# Patient Record
Sex: Male | Born: 1935 | Race: White | Hispanic: No | Marital: Single | Smoking: Former smoker
Health system: Southern US, Community
[De-identification: ages and names within clinical notes are randomized; demographics above are authoritative.]

## PROBLEM LIST (undated history)

## (undated) DIAGNOSIS — E785 Hyperlipidemia, unspecified: Secondary | ICD-10-CM

## (undated) DIAGNOSIS — G609 Hereditary and idiopathic neuropathy, unspecified: Secondary | ICD-10-CM

## (undated) DIAGNOSIS — M79609 Pain in unspecified limb: Secondary | ICD-10-CM

## (undated) DIAGNOSIS — N400 Enlarged prostate without lower urinary tract symptoms: Secondary | ICD-10-CM

## (undated) DIAGNOSIS — D649 Anemia, unspecified: Secondary | ICD-10-CM

## (undated) DIAGNOSIS — M1990 Unspecified osteoarthritis, unspecified site: Secondary | ICD-10-CM

## (undated) DIAGNOSIS — I714 Abdominal aortic aneurysm, without rupture: Secondary | ICD-10-CM

## (undated) DIAGNOSIS — K5730 Diverticulosis of large intestine without perforation or abscess without bleeding: Secondary | ICD-10-CM

## (undated) DIAGNOSIS — R42 Dizziness and giddiness: Secondary | ICD-10-CM

## (undated) DIAGNOSIS — Y92009 Unspecified place in unspecified non-institutional (private) residence as the place of occurrence of the external cause: Secondary | ICD-10-CM

## (undated) DIAGNOSIS — N289 Disorder of kidney and ureter, unspecified: Secondary | ICD-10-CM

## (undated) DIAGNOSIS — N183 Chronic kidney disease, stage 3 unspecified: Secondary | ICD-10-CM

## (undated) DIAGNOSIS — F329 Major depressive disorder, single episode, unspecified: Secondary | ICD-10-CM

## (undated) DIAGNOSIS — C189 Malignant neoplasm of colon, unspecified: Secondary | ICD-10-CM

## (undated) DIAGNOSIS — R609 Edema, unspecified: Secondary | ICD-10-CM

## (undated) DIAGNOSIS — W19XXXA Unspecified fall, initial encounter: Secondary | ICD-10-CM

## (undated) DIAGNOSIS — F419 Anxiety disorder, unspecified: Secondary | ICD-10-CM

## (undated) DIAGNOSIS — F32A Depression, unspecified: Secondary | ICD-10-CM

## (undated) DIAGNOSIS — E875 Hyperkalemia: Secondary | ICD-10-CM

## (undated) DIAGNOSIS — R079 Chest pain, unspecified: Secondary | ICD-10-CM

## (undated) DIAGNOSIS — I440 Atrioventricular block, first degree: Secondary | ICD-10-CM

## (undated) DIAGNOSIS — N189 Chronic kidney disease, unspecified: Secondary | ICD-10-CM

## (undated) DIAGNOSIS — R0602 Shortness of breath: Secondary | ICD-10-CM

## (undated) DIAGNOSIS — M25559 Pain in unspecified hip: Secondary | ICD-10-CM

## (undated) DIAGNOSIS — I498 Other specified cardiac arrhythmias: Secondary | ICD-10-CM

## (undated) DIAGNOSIS — R269 Unspecified abnormalities of gait and mobility: Secondary | ICD-10-CM

## (undated) DIAGNOSIS — M109 Gout, unspecified: Secondary | ICD-10-CM

## (undated) DIAGNOSIS — I219 Acute myocardial infarction, unspecified: Secondary | ICD-10-CM

## (undated) DIAGNOSIS — I10 Essential (primary) hypertension: Secondary | ICD-10-CM

## (undated) DIAGNOSIS — J189 Pneumonia, unspecified organism: Secondary | ICD-10-CM

## (undated) DIAGNOSIS — I2510 Atherosclerotic heart disease of native coronary artery without angina pectoris: Secondary | ICD-10-CM

## (undated) DIAGNOSIS — H6120 Impacted cerumen, unspecified ear: Secondary | ICD-10-CM

## (undated) HISTORY — DX: Hyperkalemia: E87.5

## (undated) HISTORY — DX: Chronic kidney disease, unspecified: N18.9

## (undated) HISTORY — DX: Pain in unspecified limb: M79.609

## (undated) HISTORY — DX: Acute myocardial infarction, unspecified: I21.9

## (undated) HISTORY — DX: Hyperlipidemia, unspecified: E78.5

## (undated) HISTORY — DX: Hereditary and idiopathic neuropathy, unspecified: G60.9

## (undated) HISTORY — DX: Diverticulosis of large intestine without perforation or abscess without bleeding: K57.30

## (undated) HISTORY — DX: Unspecified fall, initial encounter: W19.XXXA

## (undated) HISTORY — DX: Atrioventricular block, first degree: I44.0

## (undated) HISTORY — DX: Gout, unspecified: M10.9

## (undated) HISTORY — PX: EYE SURGERY: SHX253

## (undated) HISTORY — DX: Anemia, unspecified: D64.9

## (undated) HISTORY — DX: Unspecified abnormalities of gait and mobility: R26.9

## (undated) HISTORY — DX: Chest pain, unspecified: R07.9

## (undated) HISTORY — DX: Edema, unspecified: R60.9

## (undated) HISTORY — DX: Impacted cerumen, unspecified ear: H61.20

## (undated) HISTORY — DX: Disorder of kidney and ureter, unspecified: N28.9

## (undated) HISTORY — PX: CARDIAC CATHETERIZATION: SHX172

## (undated) HISTORY — DX: Pain in unspecified hip: M25.559

## (undated) HISTORY — DX: Abdominal aortic aneurysm, without rupture: I71.4

## (undated) HISTORY — DX: Depression, unspecified: F32.A

## (undated) HISTORY — DX: Other specified cardiac arrhythmias: I49.8

## (undated) HISTORY — DX: Malignant neoplasm of colon, unspecified: C18.9

## (undated) HISTORY — PX: MEDIASTINOTOMY: SHX1011

## (undated) HISTORY — DX: Unspecified osteoarthritis, unspecified site: M19.90

## (undated) HISTORY — DX: Essential (primary) hypertension: I10

## (undated) HISTORY — DX: Benign prostatic hyperplasia without lower urinary tract symptoms: N40.0

## (undated) HISTORY — DX: Unspecified place in unspecified non-institutional (private) residence as the place of occurrence of the external cause: Y92.009

## (undated) HISTORY — DX: Atherosclerotic heart disease of native coronary artery without angina pectoris: I25.10

## (undated) HISTORY — DX: Major depressive disorder, single episode, unspecified: F32.9

## (undated) HISTORY — PX: COLON SURGERY: SHX602

---
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
## 1982-06-12 DIAGNOSIS — I219 Acute myocardial infarction, unspecified: Secondary | ICD-10-CM

## 1982-06-12 HISTORY — DX: Acute myocardial infarction, unspecified: I21.9

## 1994-06-12 HISTORY — PX: CORONARY ARTERY BYPASS GRAFT: SHX141

## 1998-04-30 ENCOUNTER — Encounter: Payer: Self-pay | Admitting: *Deleted

## 1998-05-05 ENCOUNTER — Ambulatory Visit: Admission: RE | Admit: 1998-05-05 | Discharge: 1998-05-05 | Payer: Self-pay | Admitting: *Deleted

## 2001-06-12 DIAGNOSIS — I2510 Atherosclerotic heart disease of native coronary artery without angina pectoris: Secondary | ICD-10-CM

## 2001-06-12 DIAGNOSIS — R079 Chest pain, unspecified: Secondary | ICD-10-CM

## 2001-06-12 DIAGNOSIS — M79609 Pain in unspecified limb: Secondary | ICD-10-CM

## 2001-06-12 HISTORY — DX: Chest pain, unspecified: R07.9

## 2001-06-12 HISTORY — DX: Pain in unspecified limb: M79.609

## 2001-06-12 HISTORY — DX: Atherosclerotic heart disease of native coronary artery without angina pectoris: I25.10

## 2002-06-12 DIAGNOSIS — G609 Hereditary and idiopathic neuropathy, unspecified: Secondary | ICD-10-CM

## 2002-06-12 DIAGNOSIS — C189 Malignant neoplasm of colon, unspecified: Secondary | ICD-10-CM

## 2002-06-12 DIAGNOSIS — M25559 Pain in unspecified hip: Secondary | ICD-10-CM

## 2002-06-12 HISTORY — DX: Hereditary and idiopathic neuropathy, unspecified: G60.9

## 2002-06-12 HISTORY — PX: RIGHT COLECTOMY: SHX853

## 2002-06-12 HISTORY — DX: Pain in unspecified hip: M25.559

## 2002-06-12 HISTORY — DX: Malignant neoplasm of colon, unspecified: C18.9

## 2003-06-03 ENCOUNTER — Encounter: Admission: RE | Admit: 2003-06-03 | Discharge: 2003-06-03 | Payer: Self-pay | Admitting: General Surgery

## 2003-06-04 ENCOUNTER — Encounter (INDEPENDENT_AMBULATORY_CARE_PROVIDER_SITE_OTHER): Payer: Self-pay | Admitting: *Deleted

## 2003-06-04 ENCOUNTER — Inpatient Hospital Stay (HOSPITAL_COMMUNITY): Admission: RE | Admit: 2003-06-04 | Discharge: 2003-06-10 | Payer: Self-pay | Admitting: General Surgery

## 2003-06-13 DIAGNOSIS — R609 Edema, unspecified: Secondary | ICD-10-CM

## 2003-06-13 DIAGNOSIS — N289 Disorder of kidney and ureter, unspecified: Secondary | ICD-10-CM

## 2003-06-13 DIAGNOSIS — I498 Other specified cardiac arrhythmias: Secondary | ICD-10-CM

## 2003-06-13 HISTORY — DX: Other specified cardiac arrhythmias: I49.8

## 2003-06-13 HISTORY — DX: Disorder of kidney and ureter, unspecified: N28.9

## 2003-06-13 HISTORY — DX: Edema, unspecified: R60.9

## 2003-07-08 ENCOUNTER — Ambulatory Visit (HOSPITAL_BASED_OUTPATIENT_CLINIC_OR_DEPARTMENT_OTHER): Admission: RE | Admit: 2003-07-08 | Discharge: 2003-07-08 | Payer: Self-pay | Admitting: General Surgery

## 2003-07-08 ENCOUNTER — Ambulatory Visit (HOSPITAL_COMMUNITY): Admission: RE | Admit: 2003-07-08 | Discharge: 2003-07-08 | Payer: Self-pay | Admitting: General Surgery

## 2004-03-29 ENCOUNTER — Ambulatory Visit (HOSPITAL_COMMUNITY): Admission: RE | Admit: 2004-03-29 | Discharge: 2004-03-29 | Payer: Self-pay | Admitting: Hematology & Oncology

## 2004-04-08 ENCOUNTER — Ambulatory Visit (HOSPITAL_COMMUNITY): Admission: RE | Admit: 2004-04-08 | Discharge: 2004-04-08 | Payer: Self-pay | Admitting: Diagnostic Radiology

## 2004-04-20 ENCOUNTER — Ambulatory Visit: Payer: Self-pay | Admitting: Hematology & Oncology

## 2004-07-01 ENCOUNTER — Ambulatory Visit: Payer: Self-pay | Admitting: Internal Medicine

## 2004-07-05 ENCOUNTER — Ambulatory Visit: Payer: Self-pay | Admitting: Hematology & Oncology

## 2004-07-07 ENCOUNTER — Ambulatory Visit (HOSPITAL_COMMUNITY): Admission: RE | Admit: 2004-07-07 | Discharge: 2004-07-07 | Payer: Self-pay | Admitting: Hematology & Oncology

## 2004-07-20 ENCOUNTER — Ambulatory Visit: Payer: Self-pay | Admitting: Internal Medicine

## 2004-08-23 ENCOUNTER — Ambulatory Visit: Payer: Self-pay | Admitting: Hematology & Oncology

## 2004-11-03 ENCOUNTER — Ambulatory Visit: Payer: Self-pay | Admitting: Hematology & Oncology

## 2004-11-09 ENCOUNTER — Ambulatory Visit (HOSPITAL_COMMUNITY): Admission: RE | Admit: 2004-11-09 | Discharge: 2004-11-09 | Payer: Self-pay | Admitting: Hematology & Oncology

## 2004-12-05 ENCOUNTER — Ambulatory Visit: Payer: Self-pay | Admitting: Internal Medicine

## 2004-12-09 ENCOUNTER — Ambulatory Visit: Payer: Self-pay

## 2004-12-27 ENCOUNTER — Ambulatory Visit: Payer: Self-pay | Admitting: Hematology & Oncology

## 2005-02-28 ENCOUNTER — Ambulatory Visit: Payer: Self-pay | Admitting: Hematology & Oncology

## 2005-04-18 ENCOUNTER — Ambulatory Visit (HOSPITAL_COMMUNITY): Admission: RE | Admit: 2005-04-18 | Discharge: 2005-04-18 | Payer: Self-pay | Admitting: Hematology & Oncology

## 2005-06-07 ENCOUNTER — Ambulatory Visit: Payer: Self-pay | Admitting: Hematology & Oncology

## 2005-06-12 DIAGNOSIS — R269 Unspecified abnormalities of gait and mobility: Secondary | ICD-10-CM

## 2005-06-12 HISTORY — DX: Unspecified abnormalities of gait and mobility: R26.9

## 2005-06-16 ENCOUNTER — Ambulatory Visit: Payer: Self-pay | Admitting: Internal Medicine

## 2005-06-19 ENCOUNTER — Ambulatory Visit (HOSPITAL_BASED_OUTPATIENT_CLINIC_OR_DEPARTMENT_OTHER): Admission: RE | Admit: 2005-06-19 | Discharge: 2005-06-19 | Payer: Self-pay | Admitting: General Surgery

## 2005-06-26 ENCOUNTER — Ambulatory Visit: Payer: Self-pay | Admitting: Internal Medicine

## 2005-07-21 ENCOUNTER — Ambulatory Visit: Payer: Self-pay | Admitting: Internal Medicine

## 2005-10-11 ENCOUNTER — Ambulatory Visit: Payer: Self-pay | Admitting: Internal Medicine

## 2005-10-18 ENCOUNTER — Ambulatory Visit: Payer: Self-pay | Admitting: Cardiology

## 2005-11-24 ENCOUNTER — Ambulatory Visit: Payer: Self-pay | Admitting: Hematology & Oncology

## 2005-11-28 LAB — CBC WITH DIFFERENTIAL/PLATELET
BASO%: 0.3 % (ref 0.0–2.0)
EOS%: 2.3 % (ref 0.0–7.0)
Eosinophils Absolute: 0.1 10*3/uL (ref 0.0–0.5)
LYMPH%: 30.3 % (ref 14.0–48.0)
MCH: 31.1 pg (ref 28.0–33.4)
MCHC: 34 g/dL (ref 32.0–35.9)
MCV: 91.4 fL (ref 81.6–98.0)
MONO%: 6.7 % (ref 0.0–13.0)
Platelets: 164 10*3/uL (ref 145–400)
RBC: 4.31 10*6/uL (ref 4.20–5.71)

## 2005-11-28 LAB — COMPREHENSIVE METABOLIC PANEL
AST: 22 U/L (ref 0–37)
Alkaline Phosphatase: 57 U/L (ref 39–117)
Glucose, Bld: 97 mg/dL (ref 70–99)
Sodium: 141 mEq/L (ref 135–145)
Total Bilirubin: 0.7 mg/dL (ref 0.3–1.2)
Total Protein: 6.8 g/dL (ref 6.0–8.3)

## 2005-12-01 ENCOUNTER — Ambulatory Visit (HOSPITAL_COMMUNITY): Admission: RE | Admit: 2005-12-01 | Discharge: 2005-12-01 | Payer: Self-pay | Admitting: Hematology & Oncology

## 2006-01-02 ENCOUNTER — Ambulatory Visit: Payer: Self-pay | Admitting: Internal Medicine

## 2006-06-03 ENCOUNTER — Ambulatory Visit: Payer: Self-pay | Admitting: Hematology & Oncology

## 2006-06-08 LAB — CBC WITH DIFFERENTIAL/PLATELET
BASO%: 0.5 % (ref 0.0–2.0)
Basophils Absolute: 0 10*3/uL (ref 0.0–0.1)
EOS%: 2.7 % (ref 0.0–7.0)
Eosinophils Absolute: 0.2 10*3/uL (ref 0.0–0.5)
HCT: 43.2 % (ref 38.7–49.9)
HGB: 14.7 g/dL (ref 13.0–17.1)
LYMPH%: 24.2 % (ref 14.0–48.0)
MCH: 31.4 pg (ref 28.0–33.4)
MCHC: 34 g/dL (ref 32.0–35.9)
MCV: 92.2 fL (ref 81.6–98.0)
MONO#: 0.4 10*3/uL (ref 0.1–0.9)
MONO%: 5.5 % (ref 0.0–13.0)
NEUT#: 4.5 10*3/uL (ref 1.5–6.5)
NEUT%: 67.1 % (ref 40.0–75.0)
Platelets: 181 10*3/uL (ref 145–400)
RBC: 4.69 10*6/uL (ref 4.20–5.71)
RDW: 13.6 % (ref 11.2–14.6)
WBC: 6.8 10*3/uL (ref 4.0–10.0)
lymph#: 1.6 10*3/uL (ref 0.9–3.3)

## 2006-06-08 LAB — COMPREHENSIVE METABOLIC PANEL
ALT: 17 U/L (ref 0–53)
AST: 20 U/L (ref 0–37)
Albumin: 4.5 g/dL (ref 3.5–5.2)
Alkaline Phosphatase: 61 U/L (ref 39–117)
Calcium: 9.4 mg/dL (ref 8.4–10.5)
Chloride: 102 mEq/L (ref 96–112)
Potassium: 4.6 mEq/L (ref 3.5–5.3)
Sodium: 143 mEq/L (ref 135–145)
Total Protein: 7 g/dL (ref 6.0–8.3)

## 2006-06-12 DIAGNOSIS — M109 Gout, unspecified: Secondary | ICD-10-CM

## 2006-06-12 DIAGNOSIS — N400 Enlarged prostate without lower urinary tract symptoms: Secondary | ICD-10-CM

## 2006-06-12 DIAGNOSIS — I440 Atrioventricular block, first degree: Secondary | ICD-10-CM

## 2006-06-12 DIAGNOSIS — E875 Hyperkalemia: Secondary | ICD-10-CM

## 2006-06-12 HISTORY — DX: Hyperkalemia: E87.5

## 2006-06-12 HISTORY — DX: Atrioventricular block, first degree: I44.0

## 2006-06-12 HISTORY — DX: Benign prostatic hyperplasia without lower urinary tract symptoms: N40.0

## 2006-06-12 HISTORY — DX: Gout, unspecified: M10.9

## 2006-06-20 DIAGNOSIS — H6120 Impacted cerumen, unspecified ear: Secondary | ICD-10-CM

## 2006-06-20 HISTORY — DX: Impacted cerumen, unspecified ear: H61.20

## 2006-10-18 ENCOUNTER — Ambulatory Visit: Payer: Self-pay | Admitting: *Deleted

## 2006-12-05 ENCOUNTER — Ambulatory Visit: Payer: Self-pay | Admitting: Hematology & Oncology

## 2007-01-04 LAB — CBC WITH DIFFERENTIAL/PLATELET
BASO%: 0.3 % (ref 0.0–2.0)
EOS%: 1.9 % (ref 0.0–7.0)
HCT: 40.6 % (ref 38.7–49.9)
LYMPH%: 17.6 % (ref 14.0–48.0)
MCH: 32 pg (ref 28.0–33.4)
MCHC: 35.4 g/dL (ref 32.0–35.9)
MCV: 90.5 fL (ref 81.6–98.0)
MONO#: 0.4 10*3/uL (ref 0.1–0.9)
MONO%: 5.6 % (ref 0.0–13.0)
NEUT%: 74.6 % (ref 40.0–75.0)
Platelets: 178 10*3/uL (ref 145–400)

## 2007-01-04 LAB — COMPREHENSIVE METABOLIC PANEL
ALT: 22 U/L (ref 0–53)
Alkaline Phosphatase: 64 U/L (ref 39–117)
CO2: 23 mEq/L (ref 19–32)
Creatinine, Ser: 2.03 mg/dL — ABNORMAL HIGH (ref 0.40–1.50)
Total Bilirubin: 0.4 mg/dL (ref 0.3–1.2)

## 2007-01-04 LAB — CEA: CEA: 1.4 ng/mL (ref 0.0–5.0)

## 2007-01-08 ENCOUNTER — Ambulatory Visit: Payer: Self-pay | Admitting: Gastroenterology

## 2007-01-08 LAB — BASIC METABOLIC PANEL
CO2: 22 mEq/L (ref 19–32)
Chloride: 108 mEq/L (ref 96–112)
Creatinine, Ser: 2.17 mg/dL — ABNORMAL HIGH (ref 0.40–1.50)
Glucose, Bld: 192 mg/dL — ABNORMAL HIGH (ref 70–99)

## 2007-04-18 ENCOUNTER — Ambulatory Visit: Payer: Self-pay | Admitting: *Deleted

## 2007-06-26 ENCOUNTER — Ambulatory Visit: Payer: Self-pay | Admitting: Hematology & Oncology

## 2007-06-28 LAB — CBC WITH DIFFERENTIAL/PLATELET
Basophils Absolute: 0 10*3/uL (ref 0.0–0.1)
EOS%: 3 % (ref 0.0–7.0)
HCT: 42 % (ref 38.7–49.9)
HGB: 14.4 g/dL (ref 13.0–17.1)
MCH: 31.2 pg (ref 28.0–33.4)
MCV: 90.8 fL (ref 81.6–98.0)
MONO%: 5.1 % (ref 0.0–13.0)
NEUT%: 68.5 % (ref 40.0–75.0)
RDW: 13.8 % (ref 11.2–14.6)

## 2007-06-28 LAB — COMPREHENSIVE METABOLIC PANEL
AST: 16 U/L (ref 0–37)
Alkaline Phosphatase: 71 U/L (ref 39–117)
BUN: 28 mg/dL — ABNORMAL HIGH (ref 6–23)
Creatinine, Ser: 2.08 mg/dL — ABNORMAL HIGH (ref 0.40–1.50)
Total Bilirubin: 0.5 mg/dL (ref 0.3–1.2)

## 2007-09-26 DIAGNOSIS — Z85038 Personal history of other malignant neoplasm of large intestine: Secondary | ICD-10-CM | POA: Insufficient documentation

## 2007-09-26 DIAGNOSIS — R269 Unspecified abnormalities of gait and mobility: Secondary | ICD-10-CM

## 2007-09-26 DIAGNOSIS — D649 Anemia, unspecified: Secondary | ICD-10-CM | POA: Insufficient documentation

## 2007-09-26 DIAGNOSIS — I10 Essential (primary) hypertension: Secondary | ICD-10-CM | POA: Insufficient documentation

## 2007-09-26 DIAGNOSIS — G609 Hereditary and idiopathic neuropathy, unspecified: Secondary | ICD-10-CM | POA: Insufficient documentation

## 2007-09-26 DIAGNOSIS — E1142 Type 2 diabetes mellitus with diabetic polyneuropathy: Secondary | ICD-10-CM

## 2007-09-26 DIAGNOSIS — E785 Hyperlipidemia, unspecified: Secondary | ICD-10-CM | POA: Insufficient documentation

## 2007-09-26 DIAGNOSIS — N529 Male erectile dysfunction, unspecified: Secondary | ICD-10-CM | POA: Insufficient documentation

## 2007-09-26 DIAGNOSIS — N181 Chronic kidney disease, stage 1: Secondary | ICD-10-CM | POA: Insufficient documentation

## 2007-09-26 DIAGNOSIS — M1990 Unspecified osteoarthritis, unspecified site: Secondary | ICD-10-CM | POA: Insufficient documentation

## 2007-09-26 DIAGNOSIS — I2510 Atherosclerotic heart disease of native coronary artery without angina pectoris: Secondary | ICD-10-CM | POA: Insufficient documentation

## 2007-10-17 ENCOUNTER — Ambulatory Visit: Payer: Self-pay | Admitting: *Deleted

## 2007-12-24 ENCOUNTER — Ambulatory Visit: Payer: Self-pay | Admitting: Hematology & Oncology

## 2007-12-25 ENCOUNTER — Encounter: Payer: Self-pay | Admitting: Gastroenterology

## 2007-12-25 LAB — COMPREHENSIVE METABOLIC PANEL
ALT: 21 U/L (ref 0–53)
AST: 20 U/L (ref 0–37)
Albumin: 4.4 g/dL (ref 3.5–5.2)
Alkaline Phosphatase: 66 U/L (ref 39–117)
BUN: 28 mg/dL — ABNORMAL HIGH (ref 6–23)
CO2: 27 mEq/L (ref 19–32)
Calcium: 9.6 mg/dL (ref 8.4–10.5)
Chloride: 106 mEq/L (ref 96–112)
Creatinine, Ser: 1.75 mg/dL — ABNORMAL HIGH (ref 0.40–1.50)
Glucose, Bld: 76 mg/dL (ref 70–99)
Potassium: 4.6 mEq/L (ref 3.5–5.3)
Sodium: 141 mEq/L (ref 135–145)
Total Bilirubin: 0.5 mg/dL (ref 0.3–1.2)
Total Protein: 7 g/dL (ref 6.0–8.3)

## 2007-12-25 LAB — CEA: CEA: 1.4 ng/mL (ref 0.0–5.0)

## 2008-04-23 ENCOUNTER — Ambulatory Visit: Payer: Self-pay | Admitting: *Deleted

## 2008-07-22 ENCOUNTER — Ambulatory Visit: Payer: Self-pay | Admitting: Hematology & Oncology

## 2008-07-23 ENCOUNTER — Encounter: Payer: Self-pay | Admitting: Gastroenterology

## 2008-10-22 ENCOUNTER — Ambulatory Visit: Payer: Self-pay | Admitting: *Deleted

## 2009-04-12 ENCOUNTER — Ambulatory Visit: Payer: Self-pay | Admitting: Surgery

## 2009-06-12 DIAGNOSIS — I714 Abdominal aortic aneurysm, without rupture, unspecified: Secondary | ICD-10-CM

## 2009-06-12 HISTORY — DX: Abdominal aortic aneurysm, without rupture, unspecified: I71.40

## 2009-06-12 HISTORY — DX: Abdominal aortic aneurysm, without rupture: I71.4

## 2009-10-18 ENCOUNTER — Ambulatory Visit: Payer: Self-pay | Admitting: Surgery

## 2009-10-29 ENCOUNTER — Ambulatory Visit: Payer: Self-pay | Admitting: Surgery

## 2009-11-01 ENCOUNTER — Encounter: Admission: RE | Admit: 2009-11-01 | Discharge: 2009-11-01 | Payer: Self-pay | Admitting: Surgery

## 2009-11-01 ENCOUNTER — Ambulatory Visit: Payer: Self-pay | Admitting: Surgery

## 2010-05-09 ENCOUNTER — Encounter: Payer: Self-pay | Admitting: Internal Medicine

## 2010-05-09 ENCOUNTER — Ambulatory Visit: Payer: Self-pay | Admitting: Surgery

## 2010-05-09 ENCOUNTER — Encounter: Admission: RE | Admit: 2010-05-09 | Discharge: 2010-05-09 | Payer: Self-pay | Admitting: Surgery

## 2010-07-14 NOTE — Letter (Signed)
Summary: [facility] - Office Visit  [facility] - Office Visit   Imported By: Marylou Mccoy 05/20/2010 15:40:35  _____________________________________________________________________  External Attachment:    Type:   Image     Comment:   External Document

## 2010-10-03 ENCOUNTER — Other Ambulatory Visit: Payer: Self-pay | Admitting: Surgery

## 2010-10-03 DIAGNOSIS — I714 Abdominal aortic aneurysm, without rupture: Secondary | ICD-10-CM

## 2010-10-25 NOTE — Procedures (Signed)
DUPLEX ULTRASOUND OF ABDOMINAL AORTA   INDICATION:  Followup abdominal aortic aneurysm.   HISTORY:  Diabetes:  Yes.  Cardiac:  CAD, CABG 1996, MI in 1985.  Hypertension:  Yes.  Smoking:  No.  Connective Tissue Disorder:  Family History:  No.  Previous Surgery:  Colon cancer surgery December 2004.   DUPLEX EXAM:         AP (cm)                   TRANSVERSE (cm)  Proximal             2.42 cm                   2.58 cm  Mid                  4.86 cm                   4.78 cm  Distal               1.93 cm                   2.29 cm  Right Iliac          2.02 cm                   2.04 cm  Left Iliac           1.19 cm                   cm   PREVIOUS:  Date:  04/23/2008  AP:  4.87  TRANSVERSE:  4.85   IMPRESSION:  1. Stable abdominal aortic aneurysm measuring 4.86 cm x 4.78 cm.  2. Stable ectatic right proximal common iliac artery.  3. Mural thrombus noted.  4. No significant changes from previous study.   ___________________________________________  P. Liliane Bade, M.D.   AS/MEDQ  D:  10/22/2008  T:  10/22/2008  Job:  [id number]

## 2010-10-25 NOTE — Assessment & Plan Note (Signed)
[facility]                         GASTROENTEROLOGY OFFICE NOTE   NAME:[name], [name]                     MRN:          [id number]  DATE:01/08/2007                            DOB:          Feb 21, 1928    REASON FOR CONSULTATION:  History of colon cancer.   Mr. Worley is a 77 year old white male referred through the courtesy of  Dr. Chilton Si for evaluation.  Colon cancer was diagnosed and resected in  2004.  He received postoperative chemotherapy.  Since that time, he  apparently has done well and has no GI complaints.  Specifically, he is  without pain, change in bowel habits, melena, or hematochezia.   PAST MEDICAL HISTORY:  Pertinent for type 2 diabetes.  He has  hypertension, chronic renal insufficiency, and a gait disorder.  He has  coronary artery disease and is status post CABG.  He is status post MI  and has hypertension.  He is status post herniorrhaphy and appendectomy.   FAMILY HISTORY:  Noncontributory.   Medications include Lasix, Zocor, glipizide, lisinopril, Norvasc, and  baby aspirin.   He neither smokes or drinks.  He is divorced and retired.   REVIEW OF SYSTEMS:  Positive for feet swelling and joint pain.   PHYSICAL EXAMINATION:  Pulse 56, blood pressure 158/80.  Weight 208.   PHYSICAL EXAMINATION:  HEENT: EOMI. PERRLA. Sclerae are anicteric.  Conjunctivae are pink.  NECK:  Supple without thyromegaly, adenopathy or carotid bruits.  CHEST:  Clear to auscultation and percussion without adventitious  sounds.  CARDIAC:  Regular rhythm; normal S1 S2.  There are no murmurs, gallops  or rubs.  ABDOMEN:  Bowel sounds are normoactive.  Abdomen is soft, non-tender and  non-distended.  There are no abdominal masses, tenderness, splenic  enlargement or hepatomegaly.  EXTREMITIES:  Full range of motion.  No cyanosis, clubbing or edema.  RECTAL:  Deferred.   IMPRESSION:  1. Personal history of colorectal cancer.  2. Coronary artery  disease, stable.  3. Diabetes.  4. Hypertension.   RECOMMENDATIONS:  Colonoscopy.     Barbette Hair. Arlyce Dice, MD,FACG  Electronically Signed    RDK/MedQ  DD: 01/08/2007  DT: 01/09/2007  Job #: [id number]   cc:   [name]. Chilton Si, M.D.

## 2010-10-25 NOTE — Assessment & Plan Note (Signed)
OFFICE VISIT   [name], [name]  DOB:  [birth year]                                       11/01/2009  ZOXWR#:[id number]   Patient comes back in today for follow-up after having undergone a CT  scan to evaluate his aneurysm.  I first met patient about 2 weeks ago.  He is a former patient of Dr. Madilyn Fireman, who had been following for an  aneurysm.  Last week an ultrasound revealed a 4.6 x 5.2 aneurysm.  I  felt that it was at the size that it needed to be to further evaluated  with a CT scan.  He comes back in today for further discussions.  He has  not had any new symptoms.   I have independently reviewed his CT angiogram by my measurements, the  largest diameters of 4.8 cm.  Interestingly, he only has a right kidney.  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  It does appear  that the patient is an endovascular candidate; however, I do not think  that the size of the aneurysm warrants repair at this time.  I am going  to have him come back to see me in 6 months with a repeat CT angiogram.     Jorge Ny, MD  Electronically Signed   VWB/MEDQ  D:  11/01/2009  T:  11/02/2009  Job:  [id number]   cc:   [name]. Chilton Si, M.D.

## 2010-10-25 NOTE — Procedures (Signed)
DUPLEX ULTRASOUND OF ABDOMINAL AORTA   INDICATION:  Follow up of an abdominal aortic aneurysm.   HISTORY:  Diabetes:  Yes  Cardiac:  CAD, CABG in 1996, MI in 1995  Hypertension:  Yes  Smoking:  Previous  Connective Tissue Disorder:  Family History:  No  Previous Surgery:  No   DUPLEX EXAM:         AP (cm)                   TRANSVERSE (cm)  Proximal             5.2 cm                    4.7 cm  Mid                  4.2 cm                    4.1 cm  Distal               2.6 cm                    2.8 cm  Right Iliac          2.1 cm                    1.77 cm  Left Iliac           1.3 cm                    1.8 cm   PREVIOUS:  Date:  AP:  4.86  TRANSVERSE:  4.78   IMPRESSION:  1. Abdominal aortic aneurysm with largest measurement of 5.2 x 4.7 cm.  2. Right common iliac artery measurement of 2.1 x 1.7.  3. Mural thrombus is noted.   ___________________________________________  V. Charlena Cross, MD   CJ/MEDQ  D:  04/12/2009  T:  04/12/2009  Job:  [id number]

## 2010-10-25 NOTE — Assessment & Plan Note (Signed)
OFFICE VISIT   DRURY, ARDIZZONE  DOB:  Jun 05, 1928                                       04/18/2007  ZOXWR#:[id number]   Patient has a known abdominal aortic aneurysm.  Presents today for  surveillance followup.  Denies abdominal pain.  No back pain.  No recent  chest pain or shortness of breath.  No visual disturbance or sensory  deficit.  He does note some occasional dizziness.   His history includes diabetes, coronary artery disease, and  hypertension.  He had a colon cancer resection in December, 2004.   PHYSICAL EXAMINATION:  Patient appears generally well in no acute  distress.  No cyanosis, pallor, or jaundice.  BP 157/78, pulse 56 per  minute, respirations 16 per minute, O2 sat 97%.  His chest is clear with  equal air entry bilaterally.  No rales or rhonchi.  Normal heart sounds  without murmurs.  No carotid bruits.  Abdomen is soft and nontender.  No  masses or organomegaly felt.  AAA is not palpable.  Normal bowel sounds  without bruits.  Femoral pulses 2+ bilaterally.  Right popliteal pulse  1+.  Absent left popliteal pulse.  No pedal pulses palpable.  No ankle  edema.   Ultrasound reveals the aneurysm to have enlarged slightly, now measuring  4.4 x 4.8 cm.  This compares to values of 4.4 and 4.1 previously.   Patient has shown mild enlargement of his aneurysm.  This remains  asymptomatic.  Does not require treatment at this time.  It requires  continued close surveillance.  Will plan followup again  in six months with repeat ultrasound.  I have cautioned him regarding  the onset of any acute abdominal or back pain, to contact the office  immediately.   Balinda Quails, M.D.  Electronically Signed   PGH/MEDQ  D:  04/18/2007  T:  04/19/2007  Job:  [id number]

## 2010-10-25 NOTE — Procedures (Signed)
DUPLEX ULTRASOUND OF ABDOMINAL AORTA   INDICATION:  Followup of known AAA.   HISTORY:  Diabetes:  Yes, orally controlled.  Cardiac:  CAD, CABG in 06/1994, MI in 1985  Hypertension:  Yes.  Smoking:  No.  Connective Tissue Disorder:  Family History:  No.  Previous Surgery:  Patient had surgery for colon cancer in 05/2003.   DUPLEX EXAM:         AP (cm)                   TRANSVERSE (cm)  Proximal             3.74 Cm                   3.82 cm  Mid                  4.44 cm                   4.82 cm  Distal               1.98 cm                   2.08 cm  Right Iliac          1.77 cm                   1.69 cm  Left Iliac           1.55 cm                   1.80 cm   PREVIOUS:  Date:    AP:  4.27   TRANSVERSE:  4.23   IMPRESSION:  1. AAA has increased in size with maximum diameter measuring 4.44 cm      (AP) by 4.82 cm.  2. Moderate mural thrombus noted in mid to distal AAA.  3. Ectatic CIAs.  4. Peak systolic velocity within normal limits.   ___________________________________________  P. Liliane Bade, M.D.   PB/MEDQ  D:  04/18/2007  T:  04/18/2007  Job:  [id number]

## 2010-10-25 NOTE — Procedures (Signed)
DUPLEX ULTRASOUND OF ABDOMINAL AORTA   INDICATION:  Follow up AAA.   HISTORY:  Diabetes:  Yes  Cardiac:  Coronary artery disease, CABG in 1996, MI in 1995.  Hypertension:  Yes  Smoking:  Previous  Family History:  No  Previous Surgery:  No   DUPLEX EXAM:         AP (cm)                   TRANSVERSE (cm)  Proximal             4.0 cm                    3.9 cm  Mid                  4.6 cm                    5.2 cm  Distal               2.3 cm                    2.2 cm  Right Iliac          1.78 cm                   1.29 cm  Left Iliac           1.22 cm                   1.54 cm   PREVIOUS:  Date:  AP:  5.2  TRANSVERSE:  4.7   IMPRESSION:  1. Abdominal aortic aneurysm with the largest measurement of 4.6 cm x      5.2 cm.  2. Right iliac measures 1.8 x 1.3 cm.  3. Mural thrombus noted.   ___________________________________________  V. Charlena Cross, MD   NT/MEDQ  D:  10/18/2009  T:  10/18/2009  Job:  [id number]

## 2010-10-25 NOTE — Assessment & Plan Note (Signed)
OFFICE VISIT   CHEVEZ, SAMBRANO  DOB:  1928/06/09                                       05/09/2010  AVWUJ#:[id number]   Patient comes back today for follow-up of his infrarenal abdominal  aortic aneurysm.  I last saw him in May.  He has not had any significant  symptoms since last time.  No abdominal pain.   PHYSICAL EXAMINATION:  Heart rate 56, blood pressure 172/80, respiratory  rate 16.  Generally, he is well-appearing in no distress.  Cardiovascular:  Regular rate and rhythm.  Respirations are nonlabored.  Abdomen:  Soft, nontender.   DIAGNOSTIC STUDIES:  CT scan was performed today which shows stable  aneurysm size.  It continues to measure 4.9 cm in maximum diameter. 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  It emanates around  the aortic bifurcation.   ASSESSMENT:  Abdominal aortic aneurysm.   PLAN:  Based on the patient accessory right renal artery and single  solitary right kidney, I think he is probably going to be a better  candidate for open aneurysm repair with reimplantation of his accessory  renal artery.  Because the only mesh is 4.9 cm right now, I would like  to defer this until his aneurysm gets to be a larger size.  I am going  to have him come back in 6 months for followup with a repeat CT scan.     Jorge Ny, MD  Electronically Signed   VWB/MEDQ  D:  05/09/2010  T:  05/09/2010  Job:  [id number]   cc:   [name]. Chilton Si, M.D.  Pricilla Riffle, MD, [facility]

## 2010-10-25 NOTE — Assessment & Plan Note (Signed)
OFFICE VISIT   [name], [name]  DOB:  18-May-1928                                       10/18/2009  NWGNF#:[id number]   HISTORY:  The patient is a 77 year old gentleman who has seen Dr. Madilyn Fireman  in the past, the most recent was in 2008, for evaluation of an abdominal  aortic aneurysm.  He is followed by serial ultrasounds.  He comes in  today to discuss the findings of his ultrasound.  He denies having any  abdominal pain or back pain.   The patient has a history of colon cancer which was treated with  colectomy and he has been in remission since that time.  He also suffers  from coronary artery disease.  He has had a heart attack in the past and  has undergone coronary bypass grafting.  He is a diabetic, which began  at the age of 23.  His sugars are well-controlled, mostly in the 130s.  He also suffers from hypertension which is medically managed and  hypercholesterolemia which is medically managed.   REVIEW OF SYSTEMS:  GENERAL:  Negative fevers, chills weight gain,  weight loss.  CARDIAC:  Negative chest pain or chest pressure.  PULMONARY:  Negative shortness of breath.  GI:  Negative.  GU:  Negative for renal disease.  VASCULAR:  Negative.  NEUROLOGIC:  Positive for neuropathy in both legs.  MUSCULOSKELETAL:  Positive for arthritis and joint pain.  PSYCH: Negative.  EENT:  Negative.  HEMATOLOGIC:  Negative for bleeding problems.  SKIN:  Negative for rash.   PAST MEDICAL HISTORY:  Diabetes, hypertension, coronary artery disease,  hypercholesterolemia.   PAST SURGICAL HISTORY:  Colectomy for colon cancer, coronary artery  bypass graft, right inguinal hernia.   FAMILY HISTORY:  Negative for cardiovascular disease at an early age.   SOCIAL HISTORY:  He is single.  He is retired.  Does not smoke, quit  smoking in 1985.  Does not drink alcohol.   ALLERGIES:  None.   MEDICATIONS:  Glipizide 5 mg per day, lisinopril 40 mg per day,  simvastatin  20 mg per day, omega-3daily, multivitamin daily.   PHYSICAL EXAMINATION:  Heart rate 68, blood pressure 180/90, O2  saturation is 97% on room air.  Generally, he is well-appearing, otitis  media no distress.  HEENT:  Within normal limits.  Lungs are clear  bilaterally.  No wheezes or rhonchi.  Cardiovascular is a regular rate  and rhythm.  No murmurs, rubs or gallops.  No carotid bruits.  Palpable  femoral pulses.  Pedal pulses are not palpable.  Abdomen is soft,  nontender.  He does have a pulsatile mass, which is not tender.  Musculoskeletal:  Without major deformities.  He does not have any  cyanosis.  NEUROLOGIC:  Cranial II-XII grossly intact.  He has no focal  deficits or weakness.  Skin is without rash.   DIAGNOSTIC STUDIES:  I have independently reviewed his abdominal  ultrasound.  Measurements now are 4.6 x 5.2.   ASSESSMENT/PLAN:  Infrarenal abdominal aortic aneurysm.   PLAN:  I have discussed with the patient that the ultrasound suggests  his aneurysm has increased in size.  It is now at the size where I would  recommend further diagnostic imaging for several reasons:  The first  would be to define his anatomy, to see if he is  a candidate for  endovascular versus open repair.  The second is to get a true diameter  measurement to help [place] with the timing of his repair versus continued  observation.  We will schedule him for a CT angiogram of the abdomen and  pelvis.  He will follow up with me afterwards.  For preoperative  evaluation I am also ordering baseline ankle-brachial indices as well as  bilateral carotid ultrasound.  I will see him back in the office in the  next 1-2 weeks.     Jorge Ny, MD  Electronically Signed   VWB/MEDQ  D:  10/18/2009  T:  10/19/2009  Job:  [id number]   cc:   [name]. Chilton Si, M.D.

## 2010-10-25 NOTE — Procedures (Signed)
CAROTID DUPLEX EXAM   INDICATION:  Preop abdominal aortic aneurysm.   HISTORY:  Diabetes:  Yes.  Cardiac:  CAD, CABG in 1996, MI in 1995.  Hypertension:  Yes.  Smoking:  Previous.  Previous Surgery:  No.  CV History:  Asymptomatic.  Amaurosis Fugax No, Paresthesias No, Hemiparesis No                                       RIGHT             LEFT  Brachial systolic pressure:         204               192  Brachial Doppler waveforms:         Triphasic         Triphasic  Vertebral direction of flow:        Antegrade         Antegrade  DUPLEX VELOCITIES (cm/sec)  CCA peak systolic                   81                73  ECA peak systolic                   227               296  ICA peak systolic                   168               87  ICA end diastolic                   23                20  PLAQUE MORPHOLOGY:                  Mixed             Mixed  PLAQUE AMOUNT:                      Mild to moderate  Mild  PLAQUE LOCATION:                    ICA/ECA           ICA/ECA   IMPRESSION:  1. The right internal carotid artery Doppler velocity suggests 40%-59%      stenosis.  2. The left internal carotid artery Doppler velocity suggests 1%-39%      stenosis.  3. Bilateral external carotid artery stenosis.  4. Antegrade flow in bilateral vertebral arteries.   ___________________________________________  V. Charlena Cross, MD   NT/MEDQ  D:  10/29/2009  T:  10/29/2009  Job:  [id number]

## 2010-10-25 NOTE — Procedures (Signed)
DUPLEX ULTRASOUND OF ABDOMINAL AORTA   INDICATION:  Followup abdominal aortic aneurysm.   HISTORY:  Diabetes:  Yes.  Cardiac:  CAD, CABG in 1996, MI in 1985.  Hypertension:  Yes.  Smoking:  No.  Connective Tissue Disorder:  Family History:  No.  Previous Surgery:  Colon cancer surgery in December 2004.   DUPLEX EXAM:         AP (cm)                   TRANSVERSE (cm)  Proximal             2.19 cm                   cm  Mid                  4.87 cm                   4.85 cm  Distal               2.06 cm                   2.02 cm  Right Iliac          1.84 cm                   1.95 cm  Left Iliac           1.05 cm                   1.29 cm   PREVIOUS:  Date:  10/17/2007  AP:  4.5  TRANSVERSE:  4.9   IMPRESSION:  1. Stable abdominal aortic aneurysm with largest measurement of 4.87      cm x 4.85 cm.  2. Stable ectatic right proximal common iliac artery.  3. Mural thrombus noted.   ___________________________________________  P. Liliane Bade, M.D.   AS/MEDQ  D:  04/23/2008  T:  04/23/2008  Job:  [id number]

## 2010-10-25 NOTE — Letter (Signed)
January 08, 2007    [name]. Chilton Si, M.D.  [address]  [place], [place] [address]   RE:  COWEN, PESQUEIRA  MRN:  [id number]  /  DOB:  07-02-1927   Dear Dr. Chilton Si:   Upon your kind referral, I had the pleasure of evaluating your patient  and I am pleased to offer my findings.  I saw Abdulrahman Bracey in the  office today.  Enclosed is a copy of my progress note that details my  findings and recommendations.   Thank you for the opportunity to participate in your patient's care.    Sincerely,      Barbette Hair. Arlyce Dice, MD,FACG  Electronically Signed    RDK/MedQ  DD: 01/08/2007  DT: 01/09/2007  Job #: [id number]

## 2010-10-25 NOTE — Procedures (Signed)
DUPLEX ULTRASOUND OF ABDOMINAL AORTA   INDICATION:  Follow up known abdominal aortic aneurysm.   HISTORY:  Diabetes:  Yes.  Cardiac:  CAD, CABG in 1996, MI in 1985.  Hypertension:  Yes.  Smoking:  No.  Connective Tissue Disorder:  Family History:  No.  Previous Surgery:  Colon cancer surgery in December, 2004.   DUPLEX EXAM:         AP (cm)                   TRANSVERSE (cm)  Proximal             2.4 Cm                    2.9 cm  Mid                  4.5 cm                    4.9 cm  Distal               2.1 cm                    2.4 cm  Right Iliac          1.3 cm                    1.3 cm  Left Iliac           1.3 cm                    1.2 cm   PREVIOUS:  Date:  AP:  4.4  TRANSVERSE:  4.8   IMPRESSION:  1. Known abdominal aortic aneurysm of the mid aorta with no      significant change in diameter from the previous examination on      04/18/07.  2. Mild-to-moderate atherosclerotic plaque noted in the mid-to-distal      aorta.   ___________________________________________  P. Liliane Bade, M.D.   CH/MEDQ  D:  10/17/2007  T:  10/17/2007  Job:  [id number]

## 2010-10-28 NOTE — Op Note (Signed)
[name], [name]                          ACCOUNT NO.:  [id number]   MEDICAL RECORD NO.:  [id number]                   PATIENT TYPE:  AMB   LOCATION:  [facility]                                 FACILITY:  [facility]   PHYSICIAN:  Angelia Mould. Derrell Lolling, M.D.             DATE OF BIRTH:  1927-08-25   DATE OF PROCEDURE:  07/08/2003  DATE OF DISCHARGE:                                 OPERATIVE REPORT   PREOPERATIVE DIAGNOSIS:  Colon cancer.   POSTOPERATIVE DIAGNOSIS:  Colon cancer.   OPERATION PERFORMED:  Insertion of X-Port venous vascular access device.   SURGEON:  Angelia Mould. Derrell Lolling, M.D.   OPERATIVE INDICATION:  This is a 77 year old white man who underwent a  colectomy on June 04, 2003.  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  He recovered from  this surgery uneventfully.  He has seen Dr. Arlan Organ, who has advised  him to undergo adjuvant chemotherapy.  Dr. Myna Hidalgo has requested a Port-A-  Cath be inserted.  The patient has been counseled regarding the indications  and details and risks of this procedure and is brought to the operating room  electively.  Chemotherapy is planned for tomorrow.   OPERATIVE TECHNIQUE:  The patient was placed supine on the operating table  with a roll between his shoulders and his arms at his side.  He was  monitored and sedated by the anesthesia department.  The neck and chest were  prepped and draped in a sterile fashion.  Xylocaine 1% with epinephrine was  used as a local infiltration anesthetic.  A right internal jugular  venipuncture was performed and a guidewire inserted into the superior vena  cava under fluoroscopic guidance.  A small incision was made at this site.  A transverse incision was made about 3 cm below the right clavicle.  The  pectoralis fascia was exposed and a subcutaneous pocket was created  conservatively.  The catheter was drawn between a subcutaneous tunnel  between the neck  incision and the chest incision.  The catheter was then  measured and cut an appropriate length so that the tip would remain in the  superior vena cava.  The catheter was then secured to the port using the  locking device.  The port and catheter were flushed with heparinized saline.  The port was secured to the pectoralis fascia with two interrupted sutures  of 3-0 Vicryl.  With the patient in Trendelenburg position, we inserted the  dilator and peel-away sheath assembly over the guidewire.  The guidewire and  the dilator were removed and the catheter was inserted through the catheter  into the central venous circulation.  The peel-away sheath was removed.  The  catheter flushed easily and had excellent blood return.  Fluoroscopy  confirmed that the catheter tip was in the superior vena cava and there was  no crimp or kinking of the  catheter anywhere along its course.  The  subcutaneous tissue was closed with interrupted sutures of 3-0 Vicryl.  Skin  incisions were closed with subcuticular sutures of 4-0 Vicryl and Steri-  Strips.  We then brought an IV access assembly up to the field, which had an  angled Huber needle which was wedged on to a short IV extension tubing.  This assembly was flushed with heparinized saline.  The needle was then  inserted through the skin into the port.  We again had excellent blood  return and we then flushed the entire assembly with concentrated heparin  solution, 100 units/mL.  We then doubly clamped off the catheter with the  cap and the clamping device and then placed a sterile  bandage on this.  The patient tolerated the procedure well and was taken to  the recovery room in stable condition.  Estimated blood loss was about 10  mL.  Complications:  None.  Sponge, needle, and instrument counts were  correct.  A chest x-ray will be obtained in the operating room.                                               Angelia Mould. Derrell Lolling, M.D.    HMI/MEDQ  D:   07/08/2003  T:  07/08/2003  Job:  [id number]   cc:   [name]. Chilton Si, M.D.  [address]  [place]  [place] [address]  Fax: [phone]   Rose Phi. Myna Hidalgo, M.D.  [address] [facility]  [place], [place] [address]  Fax: [phone]

## 2010-10-28 NOTE — Op Note (Signed)
[name], [name]                          ACCOUNT NO.:  [id number]   MEDICAL RECORD NO.:  [id number]                   PATIENT TYPE:  INP   LOCATION:  X002                                 FACILITY:  [facility]   PHYSICIAN:  [name], M.D.             DATE OF BIRTH:  December 04, 1927   DATE OF PROCEDURE:  06/04/2003  DATE OF DISCHARGE:                                 OPERATIVE REPORT   PREOPERATIVE DIAGNOSIS:  Carcinoma of the cecum.   POSTOPERATIVE DIAGNOSIS:  Carcinoma of the cecum.   OPERATION PERFORMED:  Right colectomy.   SURGEON:  [name], M.D.   FIRST ASSISTANT:  [name]. [name], M.D.   OPERATIVE INDICATIONS:  This is a 77 year old white man, who was found to  have occult blood in his stool and anemia.  He underwent a colonoscopy at  the [place] in [place] by Dr. Caroleen Huber on April 15, 2003.  There was a  large, flat, ulcerated lesion in the cecum.  Biopsies showed adenocarcinoma.  CT scan of the abdomen and pelvis were performed shortly thereafter which  showed a 4.6 cm abdominal aortic aneurysm and diverticular changes of the  sigmoid colon but no evidence of any metastatic disease.  CEA level is 1.1.  He was counseled in the office regarding need for surgery.  He has undergone  a cardiac evaluation by [facility].  He is brought to the operating  room electively after a two day bowel prep.   OPERATIVE FINDINGS:  The patient had a 3-4 cm neoplastic-appearing mass in  the right colon just above the cecum.  There was some hypervascularity on  the serosa of the cecum, but there were no enlarged lymph nodes and no  obvious extension beyond the colon wall. The liver felt normal.  The omentum  felt normal.  The retroperitoneum revealed the aneurysm but no other  abnormalities.  There was no ascites.  The peritoneal surfaces felt normal.   OPERATIVE TECHNIQUE:  Following the induction of general endotracheal  anesthesia, Foley catheter was placed.   The abdomen was prepped and draped  in a sterile fashion.  A transverse incision was made in the right abdomen  near the level of the umbilicus.  Dissection was carried down through the  skin, fascia, muscle, and the abdominal cavity entered under direct vision.  The abdomen was explored with the findings as described above.  I mobilized  the terminal ileum, right colon, and hepatic flexure by dividing lateral  peritoneal attachments.  Continued to mobilize the colon medially off of the  duodenum until we had fully mobilized the terminal ileum, the right colon  and the hepatic flexure and the right transverse colon up into the abdominal  wound.  We cleaned off the right transverse colon and divided it with the  GIA stapling device to the right of the middle colic vessels.  We divided  the terminal ileum  about four inches proximal to the ileocecal valve.  A  wide mesenteric dissection was carried out as far posteriorly as possible.  Mesenteric vessels were isolated, clamped, divided, and ligated with 2-0  silk ties.  The large ileocolic and right colic vessels were doubly ligated.  The specimen was removed.  Anastomosis was created between the terminal  ileum and right transverse colon with the GIA stapling device.  There was no  bleeding from the staple lines.  The defect in the bowel walls were closed  with a TA 60 stapling device.   We then changed our instruments and gloves.  The anastomosis was inspected;  it looked fine.  A few extra sutures of 3-0 silk were placed to reinforce  the staple line at strategic places.  The mesentery was closed with  interrupted sutures of 2-0 silk.  The abdomen was then irrigated with about  three liters of saline.  There was no bleeding whatsoever.  Irrigation fluid  was clear.  Small bowel, colon, and omentum were inserted into their  anatomic positions.  Posterior rectus sheath was closed with a running  suture of #1 PDS.  The anterior rectus sheath  was closed with a running  suture of #1 PDS.  The skin was closed with skin staples.  Clean bandages  were placed and the patient taken to the recovery room in stable condition.  Estimated blood loss was about 100 mL.  Complications none.  Sponge, needle,  and instrument counts were correct.                                               [name], M.D.    HMI/MEDQ  D:  06/04/2003  T:  06/04/2003  Job:  [id number]   cc:   [name], M.D.  [address]  [place]  [place] [address]  Fax: [phone]   [name], M.D.   [facility]

## 2010-10-28 NOTE — Op Note (Signed)
NAMELUCAN, Huber              ACCOUNT NO.:  [id number]   MEDICAL RECORD NO.:  [id number]          PATIENT TYPE:  AMB   LOCATION:  DSC                          FACILITY:  [facility]   PHYSICIAN:  Timothy E. Earlene Plater, M.D. DATE OF BIRTH:  05/12/1928   DATE OF PROCEDURE:  06/19/2005  DATE OF DISCHARGE:                                 OPERATIVE REPORT   PREOPERATIVE DIAGNOSIS:  Presence of Port-A-Cath, colon cancer.   POSTOPERATIVE DIAGNOSIS:  Presence of Port-A-Cath, colon cancer.   PROCEDURE:  Removal of Port-A-Cath.   SURGEON:  Timothy E. Earlene Plater, M.D.   ANESTHESIA:  Local.   [name] is a 77 year old male, now healthy.  He has completed a complete  course of chemotherapy for advanced colon cancer and he and Dr. Arlan Organ are ready for the Port-A-Cath to be removed.  In Dr. Jacinto Halim  absence, he agrees that I should proceed with the procedure.   He is placed in the local minor room supine.  The right upper chest was  prepped and draped in the usual fashion.  1% Xylocaine with epinephrine is  used.  The old incision is entered, the cavity is entered.  The Port-A-Cath  is grasped.  It is dissected from the pocket.  No sutures were present and  it is completely removed without complications.  The wound is observed,  there is no bleeding or complication.  The wound is then closed in layers  with 3-0 Vicryl sutures and Steri-Strips.  Counts were correct.  He  tolerated it well.  Instructions are given.  He can return p.r.n.      Timothy E. Earlene Plater, M.D.  Electronically Signed     TED/MEDQ  D:  06/19/2005  T:  06/19/2005  Job:  [id number]   cc:   Angelia Mould. Derrell Lolling, M.D.  [address], [address]  [place]  [place] [address]   Rose Phi. Myna Hidalgo, M.D.  Fax: [phone]

## 2010-10-28 NOTE — Discharge Summary (Signed)
[name], [name]                          ACCOUNT NO.:  [id number]   MEDICAL RECORD NO.:  [id number]                   PATIENT TYPE:  INP   LOCATION:  0378                                 FACILITY:  [facility]   PHYSICIAN:  Angelia Mould. Derrell Lolling, M.D.             DATE OF BIRTH:  04-Jun-1928   DATE OF ADMISSION:  06/04/2003  DATE OF DISCHARGE:  06/10/2003                                 DISCHARGE SUMMARY   FINAL DIAGNOSES:  1. Mucinous carcinoma of the right colon, stage T3 N1.  2. Coronary artery disease, status post myocardial infarction, status post     coronary artery bypass grafting.  3. History of deep vein thrombosis, left leg.  4. Abdominal aortic aneurysm.  5. Hypertension.  6. Non-insulin dependent diabetes mellitus.  7. Peripheral neuropathy.   OPERATIONS PERFORMED:  Right colectomy on June 04, 2003.   HISTORY:  This is a 77 year old white man who was found to have occult blood  in his stool and was worked up in [place], [place] at the [place].  A colonoscopy  showed a large flat ulcerated lesion consistent with carcinoma in the cecum.  Pathology report revealed adenocarcinoma.  CT scanning was subsequently  performed which showed a 4.6 cm abdominal aortic aneurysm and diverticular  changes in the sigmoid colon, but there was no evidence of any metastatic  disease.  He was evaluated as an outpatient.  His cardiac __________was felt  to be stable.  He was evaluated preoperatively by Dr. Dietrich Pates at [facility].  He underwent bowel prep at home and was brought to the hospital  electively.   PHYSICAL EXAMINATION:  GENERAL:  A pleasant elderly gentleman, appeared  reasonably fit for his age.  NECK:  No mass.  LUNGS:  Clear to auscultation.  Sternotomy scar well-healed.  HEART:  Regular rate and rhythm, no murmur.  Peripheral pulses were palpable  in the radial and femoral location, but difficulty feeling pedal pulses.  ABDOMEN:  Soft and nontender.  Liver and spleen  were not enlarged.  I could  not palpate his aneurysm.  EXTREMITIES:  He moved all four extremities well.  There may have been a  trace of edema at the left ankle.   HOSPITAL COURSE:  On the date of admission, the patient was taken to the  operating room and underwent a right colectomy.  Tumor appeared localized to  the cecum.  The surgery was uneventful.   Final pathology report showed mucinous carcinoma, 5.5 cm, moderately  differentiated.  The tumor focally invaded peri-intestinal soft tissue.  Margins were negative.  There was no evidence of vascular or lymphatic  invasion.  Twenty lymph nodes were examined and two were positive for  metastatic cancer.  This was read out as a stage T3 N1 tumor.   Postoperatively, the patient did well.  He suffered no cardiac or pulmonary  complications.  He was started on Lovenox for  DVT prophylaxis the day after  surgery and had no complications from that.  After three or four days he was  started on clear liquids.  He then began passing flatus, and then advanced  his diet, and subsequently passed stool, and was ready to be discharged on  June 10, 2003.  At that time, he was tolerating a regular diet, had a  normal bowel movement, was feeling well, and was ready for discharge.  He  wound was healing uneventfully.  He was asked to return to see me in the  office in one week.  Arrangements for oncology consultation will be made as  an outpatient.                                               Angelia Mould. Derrell Lolling, M.D.    HMI/MEDQ  D:  06/29/2003  T:  06/30/2003  Job:  [id number]   cc:   [name]. Chilton Si, M.D.  [address]  [place]  [place] [address]  Fax: [phone]

## 2010-11-14 ENCOUNTER — Ambulatory Visit: Payer: Self-pay | Admitting: Surgery

## 2010-11-14 ENCOUNTER — Other Ambulatory Visit: Payer: Self-pay

## 2010-11-21 ENCOUNTER — Ambulatory Visit (INDEPENDENT_AMBULATORY_CARE_PROVIDER_SITE_OTHER): Payer: Medicare Other | Admitting: Surgery

## 2010-11-21 ENCOUNTER — Ambulatory Visit
Admission: RE | Admit: 2010-11-21 | Discharge: 2010-11-21 | Disposition: A | Discharge status: Home / Self Care | Payer: Medicare Other | Source: Ambulatory Visit | Attending: Surgery | Admitting: Surgery

## 2010-11-21 DIAGNOSIS — I714 Abdominal aortic aneurysm, without rupture, unspecified: Secondary | ICD-10-CM

## 2010-11-21 MED ORDER — IOHEXOL 350 MG/ML SOLN
75.0000 mL | Freq: Once | INTRAVENOUS | Status: AC | PRN
  Administered 2010-11-21: 75 mL via INTRAVENOUS

## 2010-11-22 NOTE — Assessment & Plan Note (Signed)
OFFICE VISIT  Douglas, Huber DOB:  Oct 11, 1927                                       11/21/2010 ZOXWR#:[id number]  The patient comes back today for followup of his infrarenal abdominal aortic aneurysm.  He denies having any symptoms of abdominal pain or back pain.  PAST MEDICAL HISTORY:  Unchanged.  He continues to suffer from diabetes, hypertension, hypercholesterolemia, coronary artery disease.  SOCIAL HISTORY:  He is single.  He is retired.  He does not smoke, quit in 1984.  He does not drink.  REVIEW OF SYSTEMS:  GENERAL:  Positive for weight loss. CARDIAC:  Positive for shortness of breath with exertion. ENT:  Positive for recent change in eyesight and hearing. MUSCULOSKELETAL:  Positive for arthritis, joint pain. PSYCHIATRIC:  Positive for depression.  PHYSICAL EXAMINATION:  Vital signs:  Heart rate 57, blood pressure 188/79, O2 sat 97%.  General:  He is well-appearing, in no distress. HEENT:  Within normal limits.  Respirations:  Are nonlabored. Cardiovascular:  Regular rate and rhythm.  Abdomen:  Soft, nontender. Neurological:  He is intact.  DIAGNOSTIC STUDIES:  CT scan was reviewed today.  This continues to show an infrarenal abdominal aortic aneurysm, maximum diameter is 52 mm. There is an accessory right lower pole renal artery originating from his distal abdominal aorta.  ASSESSMENT:  Infrarenal abdominal aortic aneurysm.  PLAN:  Again, based on the fact that the patient has a solitary right kidney and a large accessory right renal artery originating from the aortic bifurcation he is not a candidate for endovascular repair.  For that reason I have continued to recommend open repair.  We discussed waiting until his aneurysm became at least 5.5 cm before putting him through an operation.  He understands this.  We also discussed that he is at risk for rupture while waiting.  However, I think the risk of rupture is less than his risk of  complications with surgery.  I will plan on seeing him back in 6 months with a repeat CT scan.    Jorge Ny, MD Electronically Signed  VWB/MEDQ  D:  11/21/2010  T:  11/22/2010  Job:  [id number]  cc:   Pricilla Riffle, MD, [facility] [name]. Chilton Si, M.D.

## 2011-04-10 ENCOUNTER — Other Ambulatory Visit: Payer: Self-pay | Admitting: *Deleted

## 2011-04-10 DIAGNOSIS — I714 Abdominal aortic aneurysm, without rupture: Secondary | ICD-10-CM

## 2011-05-10 ENCOUNTER — Encounter: Payer: Self-pay | Admitting: Surgery

## 2011-05-26 ENCOUNTER — Encounter: Payer: Self-pay | Admitting: Surgery

## 2011-05-29 ENCOUNTER — Other Ambulatory Visit: Payer: Self-pay | Admitting: Surgery

## 2011-05-29 ENCOUNTER — Encounter: Payer: Self-pay | Admitting: Surgery

## 2011-05-29 ENCOUNTER — Ambulatory Visit (INDEPENDENT_AMBULATORY_CARE_PROVIDER_SITE_OTHER): Payer: Medicare Other | Admitting: Surgery

## 2011-05-29 ENCOUNTER — Ambulatory Visit
Admission: RE | Admit: 2011-05-29 | Discharge: 2011-05-29 | Disposition: A | Discharge status: Home / Self Care | Payer: Medicare Other | Source: Ambulatory Visit | Attending: Surgery | Admitting: Surgery

## 2011-05-29 VITALS — BP 216/79 | HR 47 | Resp 20 | Ht 72.0 in | Wt 190.8 lb

## 2011-05-29 DIAGNOSIS — I714 Abdominal aortic aneurysm, without rupture, unspecified: Secondary | ICD-10-CM | POA: Insufficient documentation

## 2011-05-29 LAB — BUN: BUN: 22 mg/dL (ref 6–23)

## 2011-05-29 NOTE — Progress Notes (Signed)
Vascular and Vein Specialist of    Patient name: [name] MRN: [id number] DOB: October 05, 1927 Sex: male     Chief Complaint  Patient presents with  . AAA    6 mo f/u; CTA without contrast today due to CR 1.9     HISTORY OF PRESENT ILLNESS: The patient comes back today for followup of his abdominal aortic aneurysm. He has complaints of right-sided flank pain which she states has been there for quite some time. He is status post resection of colon cancer many years ago by Dr. Chilton Si. He was unable to get a contrasted CT scan today because his creatinine was elevated to 1.9.  He continues to suffer from diabetes hypertension and hypercholesterolemia as well as coronary artery disease.  Past Medical History  Diagnosis Date  . Arthritis   . Depression   . Diabetes mellitus   . Hypertension   . Hyperlipidemia   . Myocardial infarction 1984  . Cancer     colon  . AAA (abdominal aortic aneurysm)   . CAD (coronary artery disease)   . Chronic kidney disease     Past Surgical History  Procedure Date  . Coronary artery bypass graft 1996  . Hernia repair   . Mediansternotomy     History   Social History  . Marital Status: Single    Spouse Name: N/A    Number of Children: N/A  . Years of Education: N/A   Occupational History  . Not on file.   Social History Main Topics  . Smoking status: Former Smoker    Types: Cigarettes    Quit date: 06/12/1982  . Smokeless tobacco: Not on file  . Alcohol Use: No  . Drug Use:   . Sexually Active:    Other Topics Concern  . Not on file   Social History Narrative  . No narrative on file    Family History  Problem Relation Age of Onset  . Heart disease Father   . Cancer Sister   . Cancer Brother     Allergies as of 05/29/2011  . (No Known Allergies)    Current Outpatient Prescriptions on File Prior to Visit  Medication Sig Dispense Refill  . Cyanocobalamin (VITAMIN B-12 PO) Take by mouth daily.        . fish  oil-omega-3 fatty acids 1000 MG capsule Take 2 g by mouth daily.        Marland Kitchen glipiZIDE (GLUCOTROL) 5 MG tablet Take 5 mg by mouth daily.        Marland Kitchen lisinopril (PRINIVIL,ZESTRIL) 40 MG tablet Take 40 mg by mouth daily.        . Multiple Vitamin (MULTIVITAMIN) capsule Take 1 capsule by mouth daily.        . simvastatin (ZOCOR) 40 MG tablet Take 40 mg by mouth at bedtime.           REVIEW OF SYSTEMS: Cardiovascular: No chest pain, chest pressure, palpitations, orthopnea, or dyspnea on exertion. No claudication or rest pain,  No history of DVT or phlebitis. Pulmonary: No productive cough, asthma or wheezing. Neurologic: No weakness, paresthesias, aphasia, or amaurosis. No dizziness. Hematologic: No bleeding problems or clotting disorders. Musculoskeletal: No joint pain or joint swelling. Gastrointestinal: No blood in stool or hematemesis Genitourinary: No dysuria or hematuria. Psychiatric:: No history of major depression. Integumentary: No rashes or ulcers. Constitutional: No fever or chills.  PHYSICAL EXAMINATION:   Vital signs are BP 216/79  Pulse 47  Resp 20  Ht 6' (  1.829 m)  Wt 190 lb 12.8 oz (86.546 kg)  BMI 25.88 kg/m2 General: The patient appears their stated age. HEENT:  No gross abnormalities Pulmonary:  Non labored breathing Abdomen: Aorta is easily palpable and nontender. There is a umbilical hernia Musculoskeletal: There are no major deformities. Neurologic: No focal weakness or paresthesias are detected, Skin: There are no ulcer or rashes noted. Psychiatric: The patient has normal affect. Cardiovascular: There is a regular rate and rhythm without significant murmur appreciated.   Diagnostic Studies CT scan was performed today without contrast. It has not been formally read by radiology but by my evaluation his aorta is unchanged in diameter I get his maximum diameter to be in the 5.0-5.1 range  Assessment: Abdominal aortic aneurysm Plan: I have not seen a significant  change in the patient's aneurysm size. He has a solitary kidney on the right with a large branch supplying the inferior pole which comes off near the aortic bifurcation. For that reason he is not a candidate for endovascular repair and will require open repair and likely reimplantation of this accessory right renal artery branch. I recommend repair when he is size just be greater than 5.5 cm.  Because of the patient's elevated creatinine to 1.9 today and clinic and an appointment a with the nephrology service.  The patient has a history of colon cancer and complains of some right flank pain. In reviewing the chart I cannot see what he has had any followup since 2008. I'm going to refer him back to the [facility] just to make sure that he is on their radar.  He'll come back to me for a CT scan in 6 months to evaluate his aneurysm  V. Charlena Cross, M.D. V[facility] Vein Specialists of [place] Office: [phone] Pager:  [phone]

## 2011-05-30 ENCOUNTER — Telehealth: Payer: Self-pay | Admitting: Gastroenterology

## 2011-05-30 NOTE — Telephone Encounter (Signed)
Patient with a history of colon CA and right hemicolectomy in 08.  Patient currently has right flank pain and Dr Myra Gianotti wants him to be seen.  He will come in and see Mike Gip PA tomorrow at 10:30

## 2011-05-31 ENCOUNTER — Ambulatory Visit: Payer: Medicare Other | Admitting: Physician Assistant

## 2011-06-01 ENCOUNTER — Encounter: Payer: Self-pay | Admitting: Physician Assistant

## 2011-06-01 ENCOUNTER — Ambulatory Visit (INDEPENDENT_AMBULATORY_CARE_PROVIDER_SITE_OTHER): Payer: Medicare Other | Admitting: Physician Assistant

## 2011-06-01 DIAGNOSIS — I714 Abdominal aortic aneurysm, without rupture, unspecified: Secondary | ICD-10-CM

## 2011-06-01 DIAGNOSIS — Z85038 Personal history of other malignant neoplasm of large intestine: Secondary | ICD-10-CM

## 2011-06-01 MED ORDER — PEG-KCL-NACL-NASULF-NA ASC-C 100 G PO SOLR: ORAL | Status: DC

## 2011-06-01 NOTE — Patient Instructions (Signed)
We have scheduled the colonoscopy with Dr Sheryn Bison for 06-23-2011.  We have given you a prescription for the colonoscopy prep you will be drinking to take to [facility] [address]. Directions provided.

## 2011-06-01 NOTE — Progress Notes (Signed)
Subjective:    Patient ID: [name], male    DOB: [birth year], 77 y.o.   MRN: [id number]  HPI [name] is a very nice 77 year old white male referred by Dr. Myra Gianotti  today for consideration of colonoscopy. He is known to Dr. Arlyce Dice and was seen in 2008. Patient does have history of stage III B. adenocarcinoma of the right colon which was diagnosed in 2004. He underwent a right hemicolectomy at that time and then was treated with 5-FU and leucovorin. He has done well. When he was seen in 2008 he was to schedule a followup colonoscopy but that did not happen. At this time he is undergoing evaluation for abdominal aortic aneurysm and had evaluation earlier this week. He had CT scan of the abdomen and pelvis which was done without contrast on 1217 and shows a 5.1 cm infrarenal abdominal aortic aneurysm. The patient tells me that Dr. Myra Gianotti is following him for now and anticipates surgery within the next 4- 5 months.  Patient had mentioned that he had some intermittent right lateral abdominal discomfort. The patient says that he does not feel that this is any big deal and has happen occasionally on his right side over the past couple of years. He says he doesn't pay much attention to it. His appetite is good his weight has been stable, he denies any difficulty with his bowels and has not noted any melena or hematochezia. He does plan to go through with repair of his abdominal aortic aneurysm.    Review of Systems  Constitutional: Negative.   HENT: Negative.   Eyes: Negative.   Respiratory: Negative.   Cardiovascular: Negative.   Genitourinary: Negative.   Musculoskeletal: Negative.   Neurological: Negative.   Hematological: Negative.   Psychiatric/Behavioral: Negative.    Outpatient Prescriptions Prior to Visit  Medication Sig Dispense Refill  . Cyanocobalamin (VITAMIN B-12 PO) Take by mouth daily.        . fish oil-omega-3 fatty acids 1000 MG capsule Take 2 g by mouth daily.        Marland Kitchen  glipiZIDE (GLUCOTROL) 5 MG tablet Take 5 mg by mouth daily.        Marland Kitchen lisinopril (PRINIVIL,ZESTRIL) 40 MG tablet Take 40 mg by mouth daily.        . Multiple Vitamin (MULTIVITAMIN) capsule Take 1 capsule by mouth daily.        . simvastatin (ZOCOR) 40 MG tablet Take 40 mg by mouth at bedtime.         No Known Allergies     Objective:   Physical Exam Well-developed thin elderly white male in no acute distress, pleasant, ;HEENT; normocephalic EOMI PERRLA sclera anicteric, Neck; supple no JVD, Cardiovascular; regular rate and rhythm with S1-S2 no murmur rub or gallop does have a sternal incisional scar, Pulmonary; clear bilaterally, Abdomen; soft, nontender, nondistended, no palpable mass or hepatosplenomegaly, he does have old lower abdominal incisional scar on the right from his partial colectomy,. There is a palpable aortic pulsation, bowel sounds active, Rectal; exam not done, Extremities; no clubbing cyanosis or edema, Psych; mood and affect normal and appropriate        Assessment & Plan:  #88 77 year old white male with history of stage III B. adenocarcinoma of the right colon 2004 status post resection and chemotherapy. I am not sure that he has had any followup colonoscopy since that time. He is currently asymptomatic with the exception of some very vague intermittent right-sided abdominal discomfort which he says he  has had off and on for a couple of years. #2 5.1 cm abdominal aortic aneurysm, plan is for repair at some point in the next 6 months #3  diabetes mellitus on oral agents #4 history of peripheral neuropathy  Plan; patient will be scheduled for colonoscopy with Dr. Jarold Motto in Dr. Marzetta Board absence. Procedure was discussed in detail with the patient and he is agreeable to proceed.

## 2011-06-02 NOTE — Progress Notes (Signed)
I agree with assessment and plan.

## 2011-06-12 ENCOUNTER — Other Ambulatory Visit: Payer: Self-pay | Admitting: Surgery

## 2011-06-12 NOTE — Progress Notes (Signed)
Addended by: Holley Raring on: 06/12/2011 10:20 AM   Modules accepted: Orders

## 2011-06-12 NOTE — Progress Notes (Signed)
Addended by: Sharee Pimple on: 06/12/2011 11:24 AM   Modules accepted: Orders

## 2011-06-13 DIAGNOSIS — K5730 Diverticulosis of large intestine without perforation or abscess without bleeding: Secondary | ICD-10-CM

## 2011-06-13 HISTORY — DX: Diverticulosis of large intestine without perforation or abscess without bleeding: K57.30

## 2011-06-21 ENCOUNTER — Other Ambulatory Visit: Payer: Medicare Other | Admitting: Gastroenterology

## 2011-06-23 ENCOUNTER — Ambulatory Visit (AMBULATORY_SURGERY_CENTER): Payer: Medicare Other | Admitting: Gastroenterology

## 2011-06-23 ENCOUNTER — Encounter: Payer: Self-pay | Admitting: Gastroenterology

## 2011-06-23 DIAGNOSIS — Z9049 Acquired absence of other specified parts of digestive tract: Secondary | ICD-10-CM

## 2011-06-23 DIAGNOSIS — K5730 Diverticulosis of large intestine without perforation or abscess without bleeding: Secondary | ICD-10-CM | POA: Insufficient documentation

## 2011-06-23 DIAGNOSIS — Z85038 Personal history of other malignant neoplasm of large intestine: Secondary | ICD-10-CM

## 2011-06-23 MED ORDER — DEXTROSE 5 % IV SOLN: INTRAVENOUS | Status: DC

## 2011-06-23 NOTE — Patient Instructions (Signed)
Discharge instructions given with verbal understanding. Handouts on diverticulosis and a high fiber diet given. Resume previous medications. 

## 2011-06-23 NOTE — Progress Notes (Signed)
Patient did not experience any of the following events: a burn prior to discharge; a fall within the facility; wrong site/side/patient/procedure/implant event; or a hospital transfer or hospital admission upon discharge from the facility. (G8907) Patient did not have preoperative order for IV antibiotic SSI prophylaxis. (G8918)  

## 2011-06-23 NOTE — Op Note (Signed)
[facility] [address] [place], [place]  [address]  COLONOSCOPY PROCEDURE REPORT  PATIENT:  Douglas, Huber  MR#:  [id number] BIRTHDATE:  1927-09-29, 83 yrs. old  GENDER:  male ENDOSCOPIST:  Vania Rea. Jarold Motto, MD, [facility] REF. BY: PROCEDURE DATE:  06/23/2011 PROCEDURE:  Diagnostic Colonoscopy ASA CLASS:  Class III INDICATIONS:  history of colon cancer PRIOR RIGHT HEMICOLECTOMY FOR CECAL CANCER. MEDICATIONS:   propofol (Diprivan) 150 mg IV  DESCRIPTION OF PROCEDURE:   After the risks and benefits and of the procedure were explained, informed consent was obtained. Digital rectal exam was performed and revealed no abnormalities. The LB CF-[id number] endoscope was introduced through the anus and advanced to the anastomosis.  The quality of the prep was good, using MoviPrep.  The instrument was then slowly withdrawn as the colon was fully examined. <<PROCEDUREIMAGES>>  FINDINGS:  Severe diverticulosis was found throughout the colon. MARKED DIVERTICULOSIS IN ENTIRE REMAINING COLON.NO ITIS OR STRICTURES.  No polyps or cancers were seen.  This was otherwise a normal examination of the colon. RIGHT HEMICOLECTOMY AND NORMAL APPEARING ANASTOMOSIS.SEE PICTURES   Retroflexed views in the rectum revealed no abnormalities.    The scope was then withdrawn from the patient and the procedure completed.  COMPLICATIONS:  None ENDOSCOPIC IMPRESSION: 1) Severe diverticulosis throughout the colon 2) No polyps or cancers 3) Otherwise normal examination PRIOR RIGHT HEMICOLECTOMY FOR CECAL CANCER. RECOMMENDATIONS: 1) High fiber diet. F/U PRN ONLY !!!!  REPEAT EXAM:  No  ______________________________ Vania Rea. Jarold Motto, MD, [facility] Graham  CC:  Earle Gell, MDEsterwood, Amy PA-C  n. eSIGNED:   Vania Rea. Patterson at 06/23/2011 04:15 PM  Dorthula Perfect, [phone]

## 2011-06-26 ENCOUNTER — Telehealth: Payer: Self-pay | Admitting: *Deleted

## 2011-06-26 NOTE — Telephone Encounter (Signed)

## 2011-06-27 ENCOUNTER — Ambulatory Visit (INDEPENDENT_AMBULATORY_CARE_PROVIDER_SITE_OTHER): Payer: Medicare Other

## 2011-06-27 DIAGNOSIS — S20219A Contusion of unspecified front wall of thorax, initial encounter: Secondary | ICD-10-CM

## 2011-06-27 DIAGNOSIS — R071 Chest pain on breathing: Secondary | ICD-10-CM

## 2011-12-08 ENCOUNTER — Encounter: Payer: Self-pay | Admitting: Surgery

## 2011-12-11 ENCOUNTER — Ambulatory Visit (INDEPENDENT_AMBULATORY_CARE_PROVIDER_SITE_OTHER): Payer: Medicare Other | Admitting: Surgery

## 2011-12-11 ENCOUNTER — Ambulatory Visit
Admission: RE | Admit: 2011-12-11 | Discharge: 2011-12-11 | Disposition: A | Discharge status: Home / Self Care | Payer: Medicare Other | Source: Ambulatory Visit | Attending: Surgery | Admitting: Surgery

## 2011-12-11 ENCOUNTER — Encounter: Payer: Self-pay | Admitting: Surgery

## 2011-12-11 DIAGNOSIS — I714 Abdominal aortic aneurysm, without rupture, unspecified: Secondary | ICD-10-CM

## 2011-12-11 NOTE — Progress Notes (Signed)
The computer system was down this morning do to lightening and therefore the patient did not see me today. He did have a CT scan which was noncontrast. It showed a stable infrarenal aneurysm measuring 4.9 cm in maximum diameter. I called the patient on the telephone and discussed this with him. I will plan on seeing him back in 6 months with an abdominal ultrasound

## 2011-12-12 ENCOUNTER — Telehealth: Payer: Self-pay

## 2011-12-12 ENCOUNTER — Telehealth: Payer: Self-pay | Admitting: *Deleted

## 2011-12-12 NOTE — Telephone Encounter (Signed)
Darel Hong at Dr. Estanislado Spire office was notified.  I will cancel the appt on 12/21/11.

## 2011-12-12 NOTE — Telephone Encounter (Signed)
His CT scan today mentions possible tumor recurrence. Please forward this to his GI doctor with documentation and acknowledgement from them. They will need to see and evaluate him. The CT scan recommends colonoscopy Please keep me updated. Since this is a possible cancer recurrance, we will need to act quickly and document everything. Thanks ----- Message ----- From: Rad Results In Interface Sent: 12/11/2011 9:53 AM To: Nada Libman, MD I forwarded the CT scan results to Dr Sheryn Bison and talked with his nurse Cordelia Pen. She said Mr Hunzeker had just had a colonscopy in January so she set him up with an office visit on 12/21/11 at 0830. I called Mr Rijos & told him the results and the appointment with Dr Jarold Motto.He took down the date/time and phone number for Dr Jarold Motto and verbalized ok. Jacquelyne Balint

## 2011-12-12 NOTE — Telephone Encounter (Signed)
Received call from [facility] saying Mr Sheeler did not need to come in to see Dr Jarold Motto. I notified Mr Wilmer of this appointment cancellation.

## 2011-12-12 NOTE — Telephone Encounter (Signed)
The CT scan in December showed the same thing colonoscopy in January was normal.

## 2011-12-12 NOTE — Telephone Encounter (Signed)
Phone call received by Darel Hong at Dr. Estanislado Spire office.  Patient had a CT scan to evaluate AAA, found :    Prior partial right hemicolectomy with suture line in the right mid  abdomen (series 3/image 54). Associated focal wall thickening  along the lateral / inferior surgical margin (series 3/image 57),  new, worrisome. Colonic diverticulosis, without associated  inflammatory changes.  The patient had a colonoscopy 06/23/2011 with no polyps showed diverticulosis and prior right hemicolectomy.  Dr. Myra Gianotti has referred the patient for colonoscopy.  Dr. Jarold Motto I have scheduled him an appt for 12/21/11 at 8:30.  Does he need any labs or additional imaging studies prior to the appt?

## 2011-12-21 ENCOUNTER — Ambulatory Visit: Payer: Medicare Other | Admitting: Gastroenterology

## 2012-04-25 LAB — BASIC METABOLIC PANEL
BUN: 23 mg/dL — AB (ref 4–21)
Creatinine: 1.8 mg/dL — AB (ref 0.6–1.3)
Glucose: 77 mg/dL
Potassium: 4.8 mmol/L (ref 3.4–5.3)
Sodium: 143 mmol/L (ref 137–147)

## 2012-04-25 LAB — CBC AND DIFFERENTIAL
HCT: 42 % (ref 41–53)
Hemoglobin: 14.4 g/dL (ref 13.5–17.5)
Neutrophils Absolute: 3 /uL
WBC: 5.6 10^3/mL

## 2012-04-25 LAB — HEMOGLOBIN A1C: Hgb A1c MFr Bld: 6.4 % — AB (ref 4.0–6.0)

## 2012-05-22 ENCOUNTER — Other Ambulatory Visit: Payer: Self-pay | Admitting: Internal Medicine

## 2012-05-22 DIAGNOSIS — G8929 Other chronic pain: Secondary | ICD-10-CM

## 2012-05-24 ENCOUNTER — Other Ambulatory Visit: Payer: Medicare Other

## 2012-05-27 ENCOUNTER — Ambulatory Visit
Admission: RE | Admit: 2012-05-27 | Discharge: 2012-05-27 | Disposition: A | Discharge status: Home / Self Care | Payer: Medicare Other | Source: Ambulatory Visit | Attending: Internal Medicine | Admitting: Internal Medicine

## 2012-05-27 DIAGNOSIS — G8929 Other chronic pain: Secondary | ICD-10-CM

## 2012-06-17 ENCOUNTER — Ambulatory Visit: Payer: Medicare Other | Admitting: Surgery

## 2012-06-28 ENCOUNTER — Encounter: Payer: Self-pay | Admitting: Neurosurgery

## 2012-07-01 ENCOUNTER — Encounter (INDEPENDENT_AMBULATORY_CARE_PROVIDER_SITE_OTHER): Payer: Medicare Other | Admitting: *Deleted

## 2012-07-01 ENCOUNTER — Encounter: Payer: Self-pay | Admitting: Neurosurgery

## 2012-07-01 ENCOUNTER — Ambulatory Visit (INDEPENDENT_AMBULATORY_CARE_PROVIDER_SITE_OTHER): Payer: Medicare Other | Admitting: Neurosurgery

## 2012-07-01 VITALS — BP 195/81 | HR 41 | Resp 18 | Ht 72.0 in | Wt 193.0 lb

## 2012-07-01 DIAGNOSIS — I714 Abdominal aortic aneurysm, without rupture, unspecified: Secondary | ICD-10-CM

## 2012-07-01 NOTE — Progress Notes (Signed)
[facility] AAA.Carotid Office Note  CC: AAA surveillance Referring Physician: Brabham  History of Present Illness: 79 year old male patient of Dr. Myra Gianotti followed for known AAA. The patient denies any unusual abdominal or back pain, the patient denies any new medical diagnoses or recent surgery. The patient does complain of very poor balance and trouble with ambulation.  Past Medical History  Diagnosis Date  . Arthritis   . Depression   . Diabetes mellitus   . Hypertension   . Hyperlipidemia   . Myocardial infarction 1984  . Colon cancer   . AAA (abdominal aortic aneurysm)   . CAD (coronary artery disease)   . Chronic kidney disease   . Anemia   . Diverticula of colon 2013    ROS: [x]  Positive   [ ]  Denies    General: [ ]  Weight loss, [ ]  Fever, [ ]  chills Neurologic: [ ]  Dizziness, [ ]  Blackouts, [ ]  Seizure [ ]  Stroke, [ ]  "Mini stroke", [ ]  Slurred speech, [ ]  Temporary blindness; [ ]  weakness in arms or legs, [ ]  Hoarseness Cardiac: [ ]  Chest pain/pressure, [ ]  Shortness of breath at rest [ ]  Shortness of breath with exertion, [ ]  Atrial fibrillation or irregular heartbeat Vascular: [x ] Pain in legs with walking, [ x] Pain in legs at rest, [ ]  Pain in legs at night,  [ ]  Non-healing ulcer, [ ]  Blood clot in vein/DVT,   Pulmonary: [ ]  Home oxygen, [ ]  Productive cough, [ ]  Coughing up blood, [ ]  Asthma,  [ ]  Wheezing Musculoskeletal:  [ ]  Arthritis, [ ]  Low back pain, [ ]  Joint pain Hematologic: [ ]  Easy Bruising, [ ]  Anemia; [ ]  Hepatitis Gastrointestinal: [ ]  Blood in stool, [ ]  Gastroesophageal Reflux/heartburn, [ ]  Trouble swallowing Urinary: [ ]  chronic Kidney disease, [ ]  on HD - [ ]  MWF or [ ]  TTHS, [ ]  Burning with urination, [ ]  Difficulty urinating Skin: [ ]  Rashes, [ ]  Wounds Psychological: [ ]  Anxiety, [ ]  Depression   Social History History  Substance Use Topics  . Smoking status: Former Smoker    Types: Cigarettes   Quit date: 06/12/1982  . Smokeless tobacco: Never Used  . Alcohol Use: No    Family History Family History  Problem Relation Age of Onset  . Heart disease Father   . Pancreatic cancer Sister   . Cancer Brother     ?  Marland Kitchen Cancer Mother     ?    No Known Allergies  Current Outpatient Prescriptions  Medication Sig Dispense Refill  . Cyanocobalamin (VITAMIN B-12 PO) Take by mouth daily.        . fish oil-omega-3 fatty acids 1000 MG capsule Take 2 g by mouth daily.        Marland Kitchen glipiZIDE (GLUCOTROL) 5 MG tablet Take 5 mg by mouth daily.        Marland Kitchen lisinopril (PRINIVIL,ZESTRIL) 40 MG tablet Take 40 mg by mouth daily.        . Multiple Vitamin (MULTIVITAMIN) capsule Take 1 capsule by mouth daily.        Marland Kitchen MYRBETRIQ 25 MG TB24       . simvastatin (ZOCOR) 40 MG tablet Take 40 mg by mouth at bedtime.          Physical Examination  Filed Vitals:   07/01/12 0933  BP: 195/81  Pulse: 41  Resp: 18    Body mass index is 26.18 kg/(m^2).  General:  WDWN in NAD Gait: Normal HEENT: WNL Eyes: Pupils equal Pulmonary: normal non-labored breathing , without Rales, rhonchi,  wheezing Cardiac: RRR, without  Murmurs, rubs or gallops; Abdomen: soft, NT, no masses Skin: no rashes, ulcers noted  Vascular Exam Pulses: 3+ radial pulses bilaterally, palpable femoral pulses bilaterally, there is a palpable nontender abdominal mass that is pulsatile Carotid bruits: Carotid pulses to auscultation no bruits are heard Extremities without ischemic changes, no Gangrene , no cellulitis; no open wounds;  Musculoskeletal: no muscle wasting or atrophy   Neurologic: A&O X 3; Appropriate Affect ; SENSATION: normal; MOTOR FUNCTION:  moving all extremities equally. Speech is fluent/normal  Non-Invasive Vascular Imaging AAA duplex today shows a maximum diameter of 4.77 AP by 4.84 transverse which is consistent with previous CT in July 2013 when he was 4.9  ASSESSMENT/PLAN: Asymptomatic AAA that'll followup in 6  months with repeat duplex. The patient's questions were encouraged and answered, he is in agreement with this plan.  Lauree Chandler ANP   Clinic MD: Myra Gianotti

## 2012-07-01 NOTE — Addendum Note (Signed)
Addended by: Sharee Pimple on: 07/01/2012 03:07 PM   Modules accepted: Orders

## 2012-09-09 DIAGNOSIS — F0390 Unspecified dementia without behavioral disturbance: Secondary | ICD-10-CM | POA: Insufficient documentation

## 2012-11-07 DIAGNOSIS — I10 Essential (primary) hypertension: Secondary | ICD-10-CM

## 2012-11-07 DIAGNOSIS — F0390 Unspecified dementia without behavioral disturbance: Secondary | ICD-10-CM

## 2012-11-07 DIAGNOSIS — K5900 Constipation, unspecified: Secondary | ICD-10-CM

## 2012-11-07 DIAGNOSIS — E119 Type 2 diabetes mellitus without complications: Secondary | ICD-10-CM

## 2012-11-19 ENCOUNTER — Encounter: Payer: Self-pay | Admitting: Geriatric Medicine

## 2012-11-20 ENCOUNTER — Encounter: Payer: Self-pay | Admitting: *Deleted

## 2012-11-20 ENCOUNTER — Ambulatory Visit (INDEPENDENT_AMBULATORY_CARE_PROVIDER_SITE_OTHER): Payer: Medicare Other | Admitting: Nurse Practitioner

## 2012-11-20 ENCOUNTER — Encounter: Payer: Self-pay | Admitting: Nurse Practitioner

## 2012-11-20 VITALS — BP 140/84 | HR 78 | Temp 97.6°F | Resp 14 | Ht 72.0 in | Wt 171.8 lb

## 2012-11-20 DIAGNOSIS — N181 Chronic kidney disease, stage 1: Secondary | ICD-10-CM

## 2012-11-20 DIAGNOSIS — I10 Essential (primary) hypertension: Secondary | ICD-10-CM

## 2012-11-20 DIAGNOSIS — E119 Type 2 diabetes mellitus without complications: Secondary | ICD-10-CM

## 2012-11-20 DIAGNOSIS — D649 Anemia, unspecified: Secondary | ICD-10-CM

## 2012-11-20 DIAGNOSIS — F0390 Unspecified dementia without behavioral disturbance: Secondary | ICD-10-CM

## 2012-11-20 DIAGNOSIS — F4323 Adjustment disorder with mixed anxiety and depressed mood: Secondary | ICD-10-CM

## 2012-11-20 DIAGNOSIS — R634 Abnormal weight loss: Secondary | ICD-10-CM

## 2012-11-20 MED ORDER — DONEPEZIL HCL 5 MG PO TABS: 5.0000 mg | ORAL_TABLET | Freq: Every day | ORAL | Status: DC

## 2012-11-20 MED ORDER — DONEPEZIL HCL 5 MG PO TABS: 5.0000 mg | ORAL_TABLET | Freq: Every evening | ORAL | Status: DC | PRN

## 2012-11-20 NOTE — Progress Notes (Signed)
Failed Clock Drawing----given by [name] Gilchrist,CMA 

## 2012-11-20 NOTE — Patient Instructions (Addendum)
Follow up in 4 weeks with DR GREEN   Encouraged to eat 3 meals a day with ensure in between meals and proper hydration

## 2012-11-20 NOTE — Progress Notes (Signed)
Patient ID: [name], male   DOB: 02/05/1928, 79 y.o.   MRN: [id number]   Allergies  Allergen Reactions  . Diovan (Valsartan)     Chief Complaint  Patient presents with  . Hospitalization Follow-up    Memory has worsen per sister, [name]    HPI: Patient is a 79 y.o. male seen in the office today for hosptial follow up  [name] is a 79 y.o. White man w/ a history of CAD, DM (diet controlled) and dementia present who was [facility] for 2 syncopal episodes and bradycardia. Which were thought to be medication related and due to one of which occurred during a manual fecal disimpaction, patient found to have a HR of 30 at this time. Upon arrival to [facility] patient found to bradycardia with HR ~40, underwent CXR which was normal and had routine lab work performed which showed hyperkalemia to 5.9 with mild hemolysis, WBC 12.8, BUN/Cr 54/2.40 respectively,CK 497, CK-MB 7.2,Trop 0.11.  Syncope secondary to bradycardia His fall which was found to be most likely related to his bradycardia which was secondary to metoprolol. CT imaging of his head was negative for acute injury. The patient was monitored on telemetry and his heart rate recovered. Metoprolol was DISCONTINUED.  Acute Kidney Injury (Baseline Cr 1.7-1.9) The patient was noted to have an elevated BUN/Cr of 53/2.32 on admission and CK of 497. This was likely secondary to dehydration, results of a fall, and poor PO intake. The patient's renal function improved with hydration and adequate PO intake. On the day of discharge the patient's BUN/Cr was 44/2.04. Additionally simvastatin was discontinued at discharge.  Dementia with occasional dementia: MOCHA (cognative assessment) 08/2012 was 4 out of 30 which is significantly reduced. Continue Zyprexa 5 mg nightly, continue reorientation if confused and the patient does well when he is exposed to light during the day.  - CAD / HTN:  continue home ASA, statin, and ace inhibitor. Discontinue metoprolol on discharge due to bradycardia.  - DM: Most recently Hemoglobin A1c was 5.7 and due to the patient's age his has a less stringent Hemoglobin A1c goal glipizide was discontinued at his prior discharge. - Urinary retention: continue mirabegron daily   Since pt has been home (pt is living alone with sister who helps him) he has home health coming to visit including nursing and therapies -- reports increase in anxiety since he has been home.  No additional syncopal episodes since he has been home.   Review of Systems:  Review of Systems  Constitutional: Positive for weight loss (most weight loss was in the hospital). Negative for malaise/fatigue.  Respiratory: Negative for shortness of breath.   Cardiovascular: Negative for chest pain and palpitations.  Gastrointestinal: Negative for abdominal pain, diarrhea and constipation.  Genitourinary: Positive for frequency. Negative for dysuria and urgency.  Skin: Negative.   Neurological: Negative for weakness.  Psychiatric/Behavioral: Positive for depression (reports he crys a lot- sister reports this is worse while is he sitting alone) and memory loss. The patient is nervous/anxious. The patient does not have insomnia.      Past Medical History  Diagnosis Date  . Arthritis   . Depression   . Diabetes mellitus   . Hypertension   . Hyperlipidemia   . Myocardial infarction 1984  . Colon cancer   . AAA (abdominal aortic aneurysm) 2011  . CAD (coronary artery disease) 2003  . Chronic kidney disease   . Anemia   .  Diverticula of colon 2013  . Hypertrophy of prostate without urinary obstruction and other lower urinary tract symptoms (LUTS) 2008  . Hyperpotassemia 2008  . Gout, unspecified 2008  . Impacted cerumen 06/20/2006  . First degree atrioventricular block 2008  . Abnormality of gait 2007  . Other specified cardiac dysrhythmias(427.89) 2005  . Unspecified disorder of  kidney and ureter 2005  . Edema 2005  . Malignant neoplasm of colon, unspecified site 2004  . Unspecified hereditary and idiopathic peripheral neuropathy 2004  . Chest pain, unspecified 2003  . Pain in joint, pelvic region and thigh 2004  . Pain in limb 2003   Past Surgical History  Procedure Laterality Date  . Mediansternotomy    . Right colectomy  2004  . Coronary artery bypass graft  1996    Bartle, MD  . Hernia repair Right 1952    In [employer]   Social History:   reports that he quit smoking about 30 years ago. His smoking use included Cigarettes. He smoked 0.00 packs per day. He has never used smokeless tobacco. He reports that he does not drink alcohol. His drug history is not on file.  Family History  Problem Relation Age of Onset  . Heart disease Father   . Pancreatic cancer Sister   . Cancer Sister     liver  . Cancer Brother     Leukemia  . Cancer Mother     ?    Medications: Patient's Medications  New Prescriptions   No medications on file  Previous Medications   ASPIRIN 81 MG TABLET    Take 81 mg by mouth daily. Take 1 tablet daily to prevent stroke and heart attack.   GLIPIZIDE (GLUCOTROL) 5 MG TABLET    Take 5 mg by mouth daily. Take 1 tablet by mouth in the morning and a half tablet before supper for diabetes.   LISINOPRIL (PRINIVIL,ZESTRIL) 40 MG TABLET    Take one tablet once daily to control BP.   MIRABEGRON ER (MYRBETRIQ) 25 MG TB24    Take 25 mg by mouth daily.   OLANZAPINE ZYDIS (ZYPREXA) 5 MG DISINTEGRATING TABLET    5 mg. Take 1 tablet (5 mg total) by mouth nightly.   SIMVASTATIN (ZOCOR) 40 MG TABLET    Take 40 mg by mouth at bedtime. Take 1 tablet once daily to lower cholesterol.   TRAZODONE (DESYREL) 50 MG TABLET    Take 50 mg by mouth at bedtime.  Modified Medications   No medications on file  Discontinued Medications   ASPIRIN EC 81 MG TABLET    81 mg. Take 1 tablet (81 mg total) by mouth daily.   CYANOCOBALAMIN (VITAMIN B-12 PO)    Take by mouth  daily.     FISH OIL-OMEGA-3 FATTY ACIDS 1000 MG CAPSULE    Take 2 g by mouth daily.     LISINOPRIL (PRINIVIL,ZESTRIL) 40 MG TABLET    40 mg. Take 40 mg by mouth daily.   METOPROLOL SUCCINATE (TOPROL-XL) 50 MG 24 HR TABLET    Take 50 mg by mouth daily. Take 1 tablet daily to control BP.   MULTIPLE VITAMIN (MULTIVITAMIN) CAPSULE    Take 1 capsule by mouth daily. Take 1 tablet daily for vitamin supplement.   MYRBETRIQ 25 MG TB24    Take 25 mg by mouth daily. Take 1 tablet by mouth daily for urge/incontinence.     Physical Exam:  Filed Vitals:   11/20/12 1506  BP: 140/84  Pulse: 78  Temp: 97.6 F (36.4 C)  TempSrc: Oral  Resp: 14  Height: 6' (1.829 m)  Weight: 171 lb 12.8 oz (77.928 kg)    Physical Exam  Constitutional: No distress.  Thin male in NAS  HENT:  Head: Normocephalic and atraumatic.  Eyes: Conjunctivae and EOM are normal. Pupils are equal, round, and reactive to light.  Neck: Normal range of motion. Neck supple.  Cardiovascular: Normal rate, regular rhythm and normal heart sounds.   Pulmonary/Chest: Effort normal and breath sounds normal. No respiratory distress.  Abdominal: Soft. Bowel sounds are normal.  Musculoskeletal: Normal range of motion.  Neurological: He is alert.  Skin: Skin is warm and dry. He is not diaphoretic.  Psychiatric: His mood appears anxious. He exhibits abnormal recent memory.     Assessment/Plan    1.   Dementia without behavioral disturbance 294.20      started on Zyprexa in hospital- will add aricept 5 mg qhs at this time.   2.   HYPERTENSION 401.9     Stable on current medications. Will cont to monitor and make changes as needed   3.   DM 250.00     Will cont on glipizide only and follow A1c   4.   ANEMIA 285.9     Will get follow up CBC today   5.   CHRONIC KIDNEY DISEASE STAGE I   Will follow up bmp   6. Weight loss  Discussion with pt and pts sister regarding proper meals and making sure pt has adequate  nutrition. Taking  ensure between meals for additional nutrition    7. Anxiety  At this time with the start of zyprexa and addition of aricept will not add any additional medication  for anxiety- will have pt follow up with Dr Chilton Si in 4 weeks   Labs/tests ordered Tsh, cbc, cmp

## 2012-11-21 LAB — CBC WITH DIFFERENTIAL/PLATELET
Basophils Absolute: 0 10*3/uL (ref 0.0–0.2)
Eos: 3 % (ref 0–5)
HCT: 36.7 % — ABNORMAL LOW (ref 37.5–51.0)
Hemoglobin: 12.6 g/dL (ref 12.6–17.7)
Immature Granulocytes: 0 % (ref 0–2)
Lymphocytes Absolute: 1.3 10*3/uL (ref 0.7–3.1)
Lymphs: 25 % (ref 14–46)
Monocytes: 4 % (ref 4–12)
RDW: 14.5 % (ref 12.3–15.4)
WBC: 5.3 10*3/uL (ref 3.4–10.8)

## 2012-11-21 LAB — BASIC METABOLIC PANEL
CO2: 24 mmol/L (ref 19–28)
Calcium: 9.1 mg/dL (ref 8.6–10.2)
Creatinine, Ser: 1.78 mg/dL — ABNORMAL HIGH (ref 0.76–1.27)
GFR calc non Af Amer: 35 mL/min/{1.73_m2} — ABNORMAL LOW (ref >59)
Glucose: 150 mg/dL — ABNORMAL HIGH (ref 65–99)
Potassium: 4.7 mmol/L (ref 3.5–5.2)

## 2012-11-21 LAB — TSH: TSH: 1.88 u[IU]/mL (ref 0.450–4.500)

## 2012-11-27 ENCOUNTER — Other Ambulatory Visit: Payer: Self-pay | Admitting: Geriatric Medicine

## 2012-11-28 ENCOUNTER — Encounter: Payer: Self-pay | Admitting: Nurse Practitioner

## 2012-11-28 DIAGNOSIS — R634 Abnormal weight loss: Secondary | ICD-10-CM | POA: Insufficient documentation

## 2012-11-28 DIAGNOSIS — F4323 Adjustment disorder with mixed anxiety and depressed mood: Secondary | ICD-10-CM | POA: Insufficient documentation

## 2012-12-04 ENCOUNTER — Other Ambulatory Visit: Payer: Self-pay | Admitting: *Deleted

## 2012-12-04 MED ORDER — BLOOD GLUCOSE METER KIT: PACK | Status: DC

## 2012-12-11 ENCOUNTER — Telehealth: Payer: Self-pay | Admitting: *Deleted

## 2012-12-11 NOTE — Telephone Encounter (Signed)
Home Health called and stated that patient was depressed. And need needs a new blood sugar machine. Spoke with his sister and she stated that she has the new prescription for a blood sugar machine.   I offered an appointment due to him very depressed. She stated that patient can wait until 18 December 2012 to discuss everything. Patient needs something for depression.  I did give her the verbal  for them to continue seeing him

## 2012-12-17 ENCOUNTER — Encounter: Payer: Self-pay | Admitting: *Deleted

## 2012-12-18 ENCOUNTER — Ambulatory Visit (INDEPENDENT_AMBULATORY_CARE_PROVIDER_SITE_OTHER): Payer: Medicare Other | Admitting: Internal Medicine

## 2012-12-18 VITALS — BP 180/82 | HR 86 | Temp 96.9°F | Resp 18 | Ht 72.0 in | Wt 174.2 lb

## 2012-12-18 DIAGNOSIS — G4700 Insomnia, unspecified: Secondary | ICD-10-CM | POA: Insufficient documentation

## 2012-12-18 DIAGNOSIS — R634 Abnormal weight loss: Secondary | ICD-10-CM

## 2012-12-18 DIAGNOSIS — C189 Malignant neoplasm of colon, unspecified: Secondary | ICD-10-CM

## 2012-12-18 DIAGNOSIS — R413 Other amnesia: Secondary | ICD-10-CM

## 2012-12-18 DIAGNOSIS — N181 Chronic kidney disease, stage 1: Secondary | ICD-10-CM

## 2012-12-18 DIAGNOSIS — I10 Essential (primary) hypertension: Secondary | ICD-10-CM

## 2012-12-18 DIAGNOSIS — E119 Type 2 diabetes mellitus without complications: Secondary | ICD-10-CM

## 2012-12-18 MED ORDER — MEMANTINE HCL ER 7 & 14 & 21 &28 MG PO CP24: 7.0000 mg | ORAL_CAPSULE | Freq: Every day | ORAL | Status: DC

## 2012-12-18 MED ORDER — TRAZODONE HCL 50 MG PO TABS: ORAL_TABLET | ORAL | Status: DC

## 2012-12-18 MED ORDER — LANCETS MISC: Status: DC

## 2012-12-18 NOTE — Progress Notes (Signed)
Subjective:    Patient ID: ANIAS BARTOL, male    DOB: September 07, 1927, 79 y.o.   MRN: [id number]  HPI Patient seen today for update of medical problems, anxiety, referral for podiatrist, and review of medications.  Patient was hospitalized in March 2014. Although very frail and with increasing problems with memory, he still continues to drive. No episodes of agitation. He has received home care from [facility].  Although he is having increasing problems with memory, he is impulsive and trying to do things more independently. Sometimes he does not take his medication on weekends. His intake is poor. He is not well motivated to move around. His barium stable on his feet and at high risk for falls.  He says he has abdominal pains and has lost control over his bowel movements. He has a history of colon cancer.  Patient is having systolic blood pressure elevations. Has inconsistent use of medications this problem  He continues to lose weight. I believe this is related to insufficient caloric intake. His previous history of colon cancer IS  Current Outpatient Prescriptions on File Prior to Visit  Medication Sig Dispense Refill  . aspirin 81 MG tablet Take 81 mg by mouth daily. Take 1 tablet daily to prevent stroke and heart attack.      . Blood Glucose Monitoring Suppl (BLOOD GLUCOSE METER) kit Use as instructed  1 each  0  . lisinopril (PRINIVIL,ZESTRIL) 40 MG tablet Take one tablet once daily to control BP.      . simvastatin (ZOCOR) 40 MG tablet Take 40 mg by mouth at bedtime. Take 1 tablet once daily to lower cholesterol.       No current facility-administered medications on file prior to visit.    Review of Systems  Constitutional: Negative for activity change, appetite change, fatigue and unexpected weight change.  HENT: Positive for hearing loss. Negative for ear pain.   Eyes: Negative.   Respiratory: Negative.   Cardiovascular: Negative for chest pain, palpitations and leg swelling.        History of abdominal aortic aneurysm. This is been followed sequentially by Dr. Durene Cal.  Gastrointestinal:       Loss of control of bowel movements.  Endocrine: Negative for cold intolerance, heat intolerance, polydipsia, polyphagia and polyuria.       Diabetic.  Genitourinary:       Decrease in urine by. Some delay starting the urinary stream. Small urinary stream with little force.  Musculoskeletal:       Chronic back discomfort. Unsteady gait. Bilateral foot drop.  Skin:       Multiple benign seborrheic keratoses.  Neurological:       Difficulty with balance. Denies dizziness. Memory loss. Walks wobbly and unsteady.  Hematological: Negative.   Psychiatric/Behavioral:       I irritability. Increase rest. Since much of the time. Insomnia. Depressive symptoms.       Objective:BP 180/82  Pulse 86  Temp(Src) 96.9 F (36.1 C) (Oral)  Resp 18  Ht 6' (1.829 m)  Wt 174 lb 3.2 oz (79.017 kg)  BMI 23.62 kg/m2  SpO2 96%    Physical Exam  Constitutional: He is oriented to person, place, and time. He appears distressed.  Elderly, frail.  HENT:  Head: Normocephalic and atraumatic.  Right Ear: External ear normal.  Left Ear: External ear normal.  Nose: Nose normal.  Mouth/Throat: Oropharynx is clear and moist.  Significant hearing loss.  Eyes: Conjunctivae and EOM are normal. Pupils are equal,  round, and reactive to light.  Ectropion of the lower lids bilaterally.  Neck: Neck supple. No JVD present. No tracheal deviation present. No thyromegaly present.  Cardiovascular: Normal rate, regular rhythm, normal heart sounds and intact distal pulses.  Exam reveals no gallop and no friction rub.   No murmur heard. Pulmonary/Chest: No respiratory distress. He has wheezes. He has no rales.  Abdominal: Soft. Bowel sounds are normal. He exhibits no distension and no mass. There is no tenderness.  Musculoskeletal: Normal range of motion. He exhibits edema.  Cautious gait.  Unsteady. Short steps. Difficulty keeping balance. Bilateral footdrop. Steppage type gait. Lives his legs high at the knees.  Lymphadenopathy:    He has no cervical adenopathy.  Neurological: He is alert and oriented to person, place, and time. No cranial nerve deficit. Coordination abnormal.  Bilateral footdrop. Loss of vibratory sensation in both lower extremities.  Skin: No rash noted. No erythema. No pallor.  Psychiatric:  Irritable. Depressed.      Office Visit on 12/18/2012  Component Date Value Range Status  . CEA 12/18/2012 2.1  0.0 - 4.7 ng/mL Final   Comment:        Roche ECLIA methodology       Nonsmokers  <3.9                                                               Smokers     <5.6  Office Visit on 11/20/2012  Component Date Value Range Status  . TSH 11/20/2012 1.880  0.450 - 4.500 uIU/mL Final  . Glucose 11/20/2012 150* 65 - 99 mg/dL Final  . BUN 16/03/9603 30* 8 - 27 mg/dL Final  . Creatinine, Ser 11/20/2012 1.78* 0.76 - 1.27 mg/dL Final  . GFR calc non Af Amer 11/20/2012 35* >59 mL/min/1.73 Final  . GFR calc Af Amer 11/20/2012 40* >59 mL/min/1.73 Final  . BUN/Creatinine Ratio 11/20/2012 17  10 - 22 Final  . Sodium 11/20/2012 142  134 - 144 mmol/L Final  . Potassium 11/20/2012 4.7  3.5 - 5.2 mmol/L Final  . Chloride 11/20/2012 106  97 - 108 mmol/L Final  . CO2 11/20/2012 24  19 - 28 mmol/L Final   Comment: **Effective November 25, 2012, the reference interval for**                            Carbon Dioxide, Total will be changing to:                                                    0 - 30 days        15 - 27                                                 31 d - 5 months       15 - 26  6 m - 11 months      15 - 25                                                    1 - 12 years       17 - 27                                                      > 12 years       18 - 90  . Calcium 11/20/2012 9.1  8.6 - 10.2 mg/dL  Final  . WBC 56/21/3086 5.3  3.4 - 10.8 x10E3/uL Final  . RBC 11/20/2012 4.05* 4.14 - 5.80 x10E6/uL Final  . Hemoglobin 11/20/2012 12.6  12.6 - 17.7 g/dL Final  . HCT 57/84/6962 36.7* 37.5 - 51.0 % Final  . MCV 11/20/2012 91  79 - 97 fL Final  . MCH 11/20/2012 31.1  26.6 - 33.0 pg Final  . MCHC 11/20/2012 34.3  31.5 - 35.7 g/dL Final  . RDW 95/28/4132 14.5  12.3 - 15.4 % Final  . Neutrophils Relative % 11/20/2012 68  40 - 74 % Final  . Lymphs 11/20/2012 25  14 - 46 % Final  . Monocytes 11/20/2012 4  4 - 12 % Final  . Eos 11/20/2012 3  0 - 5 % Final  . Basos 11/20/2012 0  0 - 3 % Final  . Neutrophils Absolute 11/20/2012 3.6  1.4 - 7.0 x10E3/uL Final  . Lymphocytes Absolute 11/20/2012 1.3  0.7 - 3.1 x10E3/uL Final  . Monocytes Absolute 11/20/2012 0.2  0.1 - 0.9 x10E3/uL Final  . Eosinophils Absolute 11/20/2012 0.1  0.0 - 0.4 x10E3/uL Final  . Basophils Absolute 11/20/2012 0.0  0.0 - 0.2 x10E3/uL Final  . Immature Granulocytes 11/20/2012 0  0 - 2 % Final  . Immature Grans (Abs) 11/20/2012 0.0  0.0 - 0.1 x10E3/uL Final       Assessment & Plan:  Memory loss - Plan: Memantine HCl ER 7 & 14 & 21 &28 MG CP24. Continue donepezil.  Colon cancer - Plan: CEA  Insomnia, unspecified - Plan: Increase trazodone to 150 mg nightly  HYPERTENSION: Continue current medications  DM : Continue current medications- Plan: Lancets MISC  CHRONIC KIDNEY DISEASE STAGE I: Unchanged  Loss of weight: Memory problems and loss of sleep and causing diminished intake. Patient encouraged to purchase foods that are easy to prepare and feet regularly.Marland Kitchen

## 2012-12-18 NOTE — Patient Instructions (Addendum)
Start the new medication to help your memory.

## 2012-12-19 ENCOUNTER — Other Ambulatory Visit: Payer: Self-pay | Admitting: Geriatric Medicine

## 2012-12-19 LAB — CEA: CEA: 2.1 ng/mL (ref 0.0–4.7)

## 2012-12-20 ENCOUNTER — Telehealth: Payer: Self-pay | Admitting: Geriatric Medicine

## 2012-12-20 NOTE — Telephone Encounter (Signed)
Patient says [employer] will cost him over 200.00. Is there a cheaper alternative?

## 2012-12-30 ENCOUNTER — Encounter (INDEPENDENT_AMBULATORY_CARE_PROVIDER_SITE_OTHER): Payer: Medicare Other | Admitting: *Deleted

## 2012-12-30 ENCOUNTER — Ambulatory Visit: Payer: Medicare Other | Admitting: Neurosurgery

## 2012-12-30 DIAGNOSIS — I714 Abdominal aortic aneurysm, without rupture, unspecified: Secondary | ICD-10-CM

## 2012-12-31 ENCOUNTER — Other Ambulatory Visit: Payer: Self-pay | Admitting: Geriatric Medicine

## 2013-01-02 ENCOUNTER — Other Ambulatory Visit: Payer: Self-pay | Admitting: Internal Medicine

## 2013-01-02 ENCOUNTER — Encounter: Payer: Self-pay | Admitting: *Deleted

## 2013-01-03 ENCOUNTER — Other Ambulatory Visit: Payer: Self-pay | Admitting: Geriatric Medicine

## 2013-01-03 MED ORDER — GLIPIZIDE 5 MG PO TABS: ORAL_TABLET | ORAL | Status: DC

## 2013-01-03 MED ORDER — OLANZAPINE 5 MG PO TABS: 5.0000 mg | ORAL_TABLET | Freq: Every day | ORAL | Status: DC

## 2013-01-06 DIAGNOSIS — K5900 Constipation, unspecified: Secondary | ICD-10-CM

## 2013-01-06 DIAGNOSIS — F0390 Unspecified dementia without behavioral disturbance: Secondary | ICD-10-CM

## 2013-01-06 DIAGNOSIS — I10 Essential (primary) hypertension: Secondary | ICD-10-CM

## 2013-01-06 DIAGNOSIS — E119 Type 2 diabetes mellitus without complications: Secondary | ICD-10-CM

## 2013-01-13 ENCOUNTER — Other Ambulatory Visit: Payer: Self-pay | Admitting: *Deleted

## 2013-01-14 ENCOUNTER — Encounter: Payer: Self-pay | Admitting: Internal Medicine

## 2013-01-15 ENCOUNTER — Other Ambulatory Visit: Payer: Self-pay | Admitting: Internal Medicine

## 2013-01-15 ENCOUNTER — Encounter: Payer: Self-pay | Admitting: Internal Medicine

## 2013-01-15 ENCOUNTER — Ambulatory Visit (INDEPENDENT_AMBULATORY_CARE_PROVIDER_SITE_OTHER): Payer: Medicare Other | Admitting: Internal Medicine

## 2013-01-15 VITALS — BP 148/88 | HR 61 | Temp 98.0°F | Resp 13 | Ht 72.0 in | Wt 174.2 lb

## 2013-01-15 DIAGNOSIS — L608 Other nail disorders: Secondary | ICD-10-CM

## 2013-01-15 DIAGNOSIS — L602 Onychogryphosis: Secondary | ICD-10-CM | POA: Insufficient documentation

## 2013-01-15 DIAGNOSIS — R634 Abnormal weight loss: Secondary | ICD-10-CM

## 2013-01-15 DIAGNOSIS — E785 Hyperlipidemia, unspecified: Secondary | ICD-10-CM

## 2013-01-15 DIAGNOSIS — G4700 Insomnia, unspecified: Secondary | ICD-10-CM

## 2013-01-15 DIAGNOSIS — D649 Anemia, unspecified: Secondary | ICD-10-CM

## 2013-01-15 DIAGNOSIS — F329 Major depressive disorder, single episode, unspecified: Secondary | ICD-10-CM

## 2013-01-15 DIAGNOSIS — M25569 Pain in unspecified knee: Secondary | ICD-10-CM

## 2013-01-15 DIAGNOSIS — M25561 Pain in right knee: Secondary | ICD-10-CM

## 2013-01-15 DIAGNOSIS — Z85038 Personal history of other malignant neoplasm of large intestine: Secondary | ICD-10-CM

## 2013-01-15 DIAGNOSIS — I10 Essential (primary) hypertension: Secondary | ICD-10-CM

## 2013-01-15 DIAGNOSIS — G609 Hereditary and idiopathic neuropathy, unspecified: Secondary | ICD-10-CM

## 2013-01-15 DIAGNOSIS — E119 Type 2 diabetes mellitus without complications: Secondary | ICD-10-CM

## 2013-01-15 DIAGNOSIS — N181 Chronic kidney disease, stage 1: Secondary | ICD-10-CM

## 2013-01-15 DIAGNOSIS — R269 Unspecified abnormalities of gait and mobility: Secondary | ICD-10-CM

## 2013-01-15 MED ORDER — NAPROXEN SODIUM 220 MG PO TABS: ORAL_TABLET | ORAL | Status: DC

## 2013-01-15 MED ORDER — TRAZODONE HCL 150 MG PO TABS: 150.0000 mg | ORAL_TABLET | Freq: Every day | ORAL | Status: DC

## 2013-01-15 NOTE — Progress Notes (Signed)
Subjective:    Patient ID: [name], male    DOB: March 17, 1928, 79 y.o.   MRN: [id number]  HPI DM: Controlled  PERIPHERAL NEUROPATHY: Unchanged  HYPERTENSION: Controlled  CHRONIC KIDNEY DISEASE STAGE I: Unchanged  Abnormality of gait: Unchanged. No falls since last visit.  Loss of weight: Stable  NEOPLASM, MALIGNANT, COLO-RECTAL, HX OF colon most recent CEA was 2.2, normal.  HYPERLIPIDEMIA: Controlled  ANEMIA: Improved  Insomnia, unspecified: Improved on trazodone  Depressive disorder, not elsewhere classified: Zyprexa doesn't seem to be adding anything to the antidepression effects and may be making him excessively drowsy during the day  Onychogryphosis: Thick and uncomfortable nails. Caretaker would like him seen by a podiatrist for trimming. He is a diabetic. She is afraid to do this herself.  Pain in joint, lower leg, right: Improved but still uncomfortable    Current Outpatient Prescriptions on File Prior to Visit  Medication Sig Dispense Refill  . aspirin 81 MG tablet Take 81 mg by mouth daily. Take 1 tablet daily to prevent stroke and heart attack.      . Blood Glucose Monitoring Suppl (BLOOD GLUCOSE METER) kit Use as instructed  1 each  0  . donepezil (ARICEPT) 5 MG tablet Take 1 tablet (5 mg total) by mouth at bedtime.  30 tablet  0  . glipiZIDE (GLUCOTROL) 5 MG tablet Take 1 tablet by mouth in the morning and a half tablet before supper for diabetes.  45 tablet  3  . hydrALAZINE (APRESOLINE) 25 MG tablet       . Lancets MISC Use to check glucose  100 each  5  . lisinopril (PRINIVIL,ZESTRIL) 40 MG tablet Take one tablet once daily to control BP.      Marland Kitchen Memantine HCl ER 7 & 14 & 21 &28 MG CP24 Take 7 mg by mouth daily. Take as directed on package to help memory  28 capsule  0  . metoprolol (LOPRESSOR) 50 MG tablet       . OLANZapine zydis (ZYPREXA) 5 MG disintegrating tablet 5 mg. Take 1 tablet (5 mg total) by mouth nightly.      . simvastatin (ZOCOR) 40 MG  tablet Take 40 mg by mouth at bedtime. Take 1 tablet once daily to lower cholesterol.      . traZODone (DESYREL) 50 MG tablet One at bedtime to help sleep  30 tablet  5   No current facility-administered medications on file prior to visit.    Review of Systems  Constitutional: Positive for activity change.  HENT: Negative.   Eyes: Negative.   Respiratory: Negative.   Cardiovascular: Negative for chest pain, palpitations and leg swelling.  Endocrine: Negative.   Musculoskeletal: Positive for back pain, arthralgias and gait problem.       Very unsteady. Using cane.  Skin: Negative.   Neurological:       Memory . Peripheral neuropathy.  Hematological: Negative.   Psychiatric/Behavioral: Positive for dysphoric mood.       Objective:BP 148/88  Pulse 61  Temp(Src) 98 F (36.7 C) (Oral)  Resp 13  Ht 6' (1.829 m)  Wt 174 lb 3.2 oz (79.017 kg)  BMI 23.62 kg/m2    Physical Exam  Constitutional: He appears well-nourished. No distress.  HENT:  Head: Normocephalic and atraumatic.  Right Ear: External ear normal.  Left Ear: External ear normal.  Nose: Nose normal.  Eyes: Conjunctivae and EOM are normal.  Neck: Neck supple. No JVD present. No tracheal deviation present. No thyromegaly present.  Cardiovascular: Normal rate, regular rhythm, normal heart sounds and intact distal pulses.  Exam reveals no gallop and no friction rub.   No murmur heard. Pulmonary/Chest: No respiratory distress. He has no wheezes. He has no rales.  Abdominal: Bowel sounds are normal. He exhibits no distension and no mass. There is no tenderness.  Musculoskeletal: Normal range of motion. He exhibits no edema and no tenderness.  Lymphadenopathy:    He has no cervical adenopathy.  Neurological: No cranial nerve deficit.  Ataxic gait disorder. High risk for falling. Using cane. Loss of memory.  Skin: No rash noted. No erythema. No pallor.  Psychiatric:  Depression. Sleeps 12 hours or more per day. Hopeless  outlook.      Office Visit on 12/18/2012  Component Date Value Range Status  . CEA 12/18/2012 2.1  0.0 - 4.7 ng/mL Final   Comment:        Roche ECLIA methodology       Nonsmokers  <3.9                                                               Smokers     <5.6  Office Visit on 11/20/2012  Component Date Value Range Status  . TSH 11/20/2012 1.880  0.450 - 4.500 uIU/mL Final  . Glucose 11/20/2012 150* 65 - 99 mg/dL Final  . BUN 16/03/9603 30* 8 - 27 mg/dL Final  . Creatinine, Ser 11/20/2012 1.78* 0.76 - 1.27 mg/dL Final  . GFR calc non Af Amer 11/20/2012 35* >59 mL/min/1.73 Final  . GFR calc Af Amer 11/20/2012 40* >59 mL/min/1.73 Final  . BUN/Creatinine Ratio 11/20/2012 17  10 - 22 Final  . Sodium 11/20/2012 142  134 - 144 mmol/L Final  . Potassium 11/20/2012 4.7  3.5 - 5.2 mmol/L Final  . Chloride 11/20/2012 106  97 - 108 mmol/L Final  . CO2 11/20/2012 24  19 - 28 mmol/L Final   Comment: **Effective November 25, 2012, the reference interval for**                            Carbon Dioxide, Total will be changing to:                                                    0 - 30 days        15 - 27                                                 31 d - 5 months       15 - 26                                                  6 m - 11 months      15 -  25                                                    1 - 12 years       17 - 27                                                      > 12 years       18 - 29  . Calcium 11/20/2012 9.1  8.6 - 10.2 mg/dL Final  . WBC 16/03/9603 5.3  3.4 - 10.8 x10E3/uL Final  . RBC 11/20/2012 4.05* 4.14 - 5.80 x10E6/uL Final  . Hemoglobin 11/20/2012 12.6  12.6 - 17.7 g/dL Final  . HCT 54/02/8118 36.7* 37.5 - 51.0 % Final  . MCV 11/20/2012 91  79 - 97 fL Final  . MCH 11/20/2012 31.1  26.6 - 33.0 pg Final  . MCHC 11/20/2012 34.3  31.5 - 35.7 g/dL Final  . RDW 14/78/2956 14.5  12.3 - 15.4 % Final  . Neutrophils Relative % 11/20/2012 68  40 - 74 % Final  .  Lymphs 11/20/2012 25  14 - 46 % Final  . Monocytes 11/20/2012 4  4 - 12 % Final  . Eos 11/20/2012 3  0 - 5 % Final  . Basos 11/20/2012 0  0 - 3 % Final  . Neutrophils Absolute 11/20/2012 3.6  1.4 - 7.0 x10E3/uL Final  . Lymphocytes Absolute 11/20/2012 1.3  0.7 - 3.1 x10E3/uL Final  . Monocytes Absolute 11/20/2012 0.2  0.1 - 0.9 x10E3/uL Final  . Eosinophils Absolute 11/20/2012 0.1  0.0 - 0.4 x10E3/uL Final  . Basophils Absolute 11/20/2012 0.0  0.0 - 0.2 x10E3/uL Final  . Immature Granulocytes 11/20/2012 0  0 - 2 % Final  . Immature Grans (Abs) 11/20/2012 0.0  0.0 - 0.1 x10E3/uL Final       Assessment & Plan:  DM : Under control  - Plan: Hemoglobin A1c, Basic metabolic panel  PERIPHERAL NEUROPATHY: Unchanged  HYPERTENSION: Satisfactory control  CHRONIC KIDNEY DISEASE STAGE I: Unchanged  Abnormality of gait: Unchanged  Loss of weight: Weight stable now  NEOPLASM, MALIGNANT, COLO-RECTAL, HX OF: Most recent CEA normal.  HYPERLIPIDEMIA - Plan: Lipid panel  ANEMIA:: Improved  Insomnia, unspecified: Improved  Depressive disorder, not elsewhere classified: Still struggling with depressed feelings. Zyprexa is not helping. We'll discontinue this drug and increase trazodone.   - Plan: traZODone (DESYREL) 150 MG tablet  Onychogryphosis - Plan: Ambulatory referral to Podiatry  Pain in joint, lower leg, right - Plan: naproxen sodium (ALEVE) 220 MG tablet

## 2013-01-15 NOTE — Patient Instructions (Signed)
Stop Zyprexa. Increase trazodone to 150 mg at night.

## 2013-01-16 ENCOUNTER — Other Ambulatory Visit: Payer: Self-pay | Admitting: Geriatric Medicine

## 2013-01-16 DIAGNOSIS — F0390 Unspecified dementia without behavioral disturbance: Secondary | ICD-10-CM

## 2013-01-16 MED ORDER — DONEPEZIL HCL 5 MG PO TABS: 5.0000 mg | ORAL_TABLET | Freq: Every day | ORAL | Status: DC

## 2013-01-20 ENCOUNTER — Other Ambulatory Visit: Payer: Self-pay | Admitting: *Deleted

## 2013-01-20 DIAGNOSIS — I714 Abdominal aortic aneurysm, without rupture: Secondary | ICD-10-CM

## 2013-01-21 ENCOUNTER — Encounter: Payer: Self-pay | Admitting: Surgery

## 2013-01-21 ENCOUNTER — Other Ambulatory Visit: Payer: Self-pay | Admitting: *Deleted

## 2013-01-21 DIAGNOSIS — I714 Abdominal aortic aneurysm, without rupture: Secondary | ICD-10-CM

## 2013-02-01 DIAGNOSIS — C189 Malignant neoplasm of colon, unspecified: Secondary | ICD-10-CM | POA: Insufficient documentation

## 2013-02-01 DIAGNOSIS — R413 Other amnesia: Secondary | ICD-10-CM | POA: Insufficient documentation

## 2013-02-04 ENCOUNTER — Other Ambulatory Visit: Payer: Self-pay | Admitting: Internal Medicine

## 2013-02-06 ENCOUNTER — Encounter: Payer: Self-pay | Admitting: Nurse Practitioner

## 2013-02-06 ENCOUNTER — Ambulatory Visit (INDEPENDENT_AMBULATORY_CARE_PROVIDER_SITE_OTHER): Payer: Medicare Other | Admitting: Nurse Practitioner

## 2013-02-06 VITALS — BP 148/80 | HR 52 | Temp 97.9°F | Wt 178.0 lb

## 2013-02-06 DIAGNOSIS — J019 Acute sinusitis, unspecified: Secondary | ICD-10-CM

## 2013-02-06 MED ORDER — AMOXICILLIN-POT CLAVULANATE 875-125 MG PO TABS: 1.0000 | ORAL_TABLET | Freq: Two times a day (BID) | ORAL | Status: DC

## 2013-02-06 MED ORDER — SACCHAROMYCES BOULARDII 250 MG PO CAPS: 250.0000 mg | ORAL_CAPSULE | Freq: Two times a day (BID) | ORAL | Status: DC

## 2013-02-06 NOTE — Patient Instructions (Addendum)
Will have you start Augmentin take this twice For 10 days Take florastor twice daily for 14 days  Take Claritin daily (if not already taking) May use PLAIN nasal spray as needed to help with congestion   Sinusitis Sinusitis is redness, soreness, and swelling (inflammation) of the paranasal sinuses. Paranasal sinuses are air pockets within the bones of your face (beneath the eyes, the middle of the forehead, or above the eyes). In healthy paranasal sinuses, mucus is able to drain out, and air is able to circulate through them by way of your nose. However, when your paranasal sinuses are inflamed, mucus and air can become trapped. This can allow bacteria and other germs to grow and cause infection. Sinusitis can develop quickly and last only a short time (acute) or continue over a long period (chronic). Sinusitis that lasts for more than 12 weeks is considered chronic.  CAUSES  Causes of sinusitis include:  Allergies.  Structural abnormalities, such as displacement of the cartilage that separates your nostrils (deviated septum), which can decrease the air flow through your nose and sinuses and affect sinus drainage.  Functional abnormalities, such as when the small hairs (cilia) that line your sinuses and help remove mucus do not work properly or are not present. SYMPTOMS  Symptoms of acute and chronic sinusitis are the same. The primary symptoms are pain and pressure around the affected sinuses. Other symptoms include:  Upper toothache.  Earache.  Headache.  Bad breath.  Decreased sense of smell and taste.  A cough, which worsens when you are lying flat.  Fatigue.  Fever.  Thick drainage from your nose, which often is green and may contain pus (purulent).  Swelling and warmth over the affected sinuses. DIAGNOSIS  Your caregiver will perform a physical exam. During the exam, your caregiver may:  Look in your nose for signs of abnormal growths in your nostrils (nasal  polyps).  Tap over the affected sinus to check for signs of infection.  View the inside of your sinuses (endoscopy) with a special imaging device with a light attached (endoscope), which is inserted into your sinuses. If your caregiver suspects that you have chronic sinusitis, one or more of the following tests may be recommended:  Allergy tests.  Nasal culture A sample of mucus is taken from your nose and sent to a lab and screened for bacteria.  Nasal cytology A sample of mucus is taken from your nose and examined by your caregiver to determine if your sinusitis is related to an allergy. TREATMENT  Most cases of acute sinusitis are related to a viral infection and will resolve on their own within 10 days. Sometimes medicines are prescribed to help relieve symptoms (pain medicine, decongestants, nasal steroid sprays, or saline sprays).  However, for sinusitis related to a bacterial infection, your caregiver will prescribe antibiotic medicines. These are medicines that will help kill the bacteria causing the infection.  Rarely, sinusitis is caused by a fungal infection. In theses cases, your caregiver will prescribe antifungal medicine. For some cases of chronic sinusitis, surgery is needed. Generally, these are cases in which sinusitis recurs more than 3 times per year, despite other treatments. HOME CARE INSTRUCTIONS   Drink plenty of water. Water helps thin the mucus so your sinuses can drain more easily.  Use a humidifier.  Inhale steam 3 to 4 times a day (for example, sit in the bathroom with the shower running).  Apply a warm, moist washcloth to your face 3 to 4 times a  day, or as directed by your caregiver.  Use saline nasal sprays to help moisten and clean your sinuses.  Take over-the-counter or prescription medicines for pain, discomfort, or fever only as directed by your caregiver. SEEK IMMEDIATE MEDICAL CARE IF:  You have increasing pain or severe headaches.  You have  nausea, vomiting, or drowsiness.  You have swelling around your face.  You have vision problems.  You have a stiff neck.  You have difficulty breathing.  Fevers or chills  MAKE SURE YOU:   Understand these instructions.  Will watch your condition.  Will get help right away if you are not doing well or get worse. Document Released: 05/29/2005 Document Revised: 08/21/2011 Document Reviewed: 06/13/2011 [facility] Patient Information 2014 [place], [place].

## 2013-02-06 NOTE — Progress Notes (Signed)
Patient ID: [name], male   DOB: 06/11/1928, 79 y.o.   MRN: [id number]   Allergies  Allergen Reactions  . Diovan [Valsartan]     Chief Complaint  Patient presents with  . Sinus Problem    ? sinus infection   . Dizziness    x 1 week, patient questions if this is related to any of his medications  . Vomiting    off/on x 1 week, last episode 2-3 days ago     HPI: Patient is a 79 y.o. male seen in the office today for possible sinus infection. Nurse comes by pts house and pt reports that's what she told him. Pt reports he is dizzy, slight headache Pt reports for 1 week he has been dizzy; worse at night with N/V x1.  Pt reports all his problems are in his head but does not remember or know what is going on.  Says his caregiver knows what is going on-- and care giver is not here today Reports runny nose, dry cough,  head feels stopped up, no fever or chills   Review of Systems:  Review of Systems  Constitutional: Negative for fever, chills and malaise/fatigue.  HENT: Positive for congestion.        Head feeling full  Respiratory: Negative for shortness of breath.   Cardiovascular: Negative for chest pain and palpitations.  Gastrointestinal: Negative for heartburn, nausea, vomiting, abdominal pain, diarrhea and constipation.  Genitourinary: Negative for dysuria, urgency and frequency.  Musculoskeletal: Negative.   Neurological: Positive for dizziness. Negative for weakness.     Past Medical History  Diagnosis Date  . Arthritis   . Depression   . Diabetes mellitus   . Hypertension   . Hyperlipidemia   . Myocardial infarction 1984  . Colon cancer   . AAA (abdominal aortic aneurysm) 2011  . CAD (coronary artery disease) 2003  . Chronic kidney disease   . Anemia   . Diverticula of colon 2013  . Hypertrophy of prostate without urinary obstruction and other lower urinary tract symptoms (LUTS) 2008  . Hyperpotassemia 2008  . Gout, unspecified 2008  . Impacted cerumen  06/20/2006  . First degree atrioventricular block 2008  . Abnormality of gait 2007  . Other specified cardiac dysrhythmias(427.89) 2005  . Unspecified disorder of kidney and ureter 2005  . Edema 2005  . Malignant neoplasm of colon, unspecified site 2004  . Unspecified hereditary and idiopathic peripheral neuropathy 2004  . Chest pain, unspecified 2003  . Pain in joint, pelvic region and thigh 2004  . Pain in limb 2003   Past Surgical History  Procedure Laterality Date  . Mediansternotomy    . Right colectomy  2004  . Coronary artery bypass graft  1996    Bartle, MD  . Hernia repair Right 1952    In [employer]   Social History:   reports that he quit smoking about 30 years ago. His smoking use included Cigarettes. He smoked 0.00 packs per day. He has never used smokeless tobacco. He reports that  drinks alcohol. He reports that he does not use illicit drugs.  Family History  Problem Relation Age of Onset  . Heart disease Father   . Pancreatic cancer Sister   . Cancer Sister     liver  . Cancer Brother     Leukemia  . Cancer Mother     ?    Medications: Patient's Medications  New Prescriptions   No medications on file  Previous Medications  ASPIRIN 81 MG TABLET    Take 81 mg by mouth daily. Take 1 tablet daily to prevent stroke and heart attack.   BLOOD GLUCOSE MONITORING SUPPL (BLOOD GLUCOSE METER) KIT    Use as instructed   DONEPEZIL (ARICEPT) 5 MG TABLET    Take 1 tablet (5 mg total) by mouth at bedtime.   GLIPIZIDE (GLUCOTROL) 5 MG TABLET    TAKE 1 TABLET EVERYDAY IN THE MORNING AND 1/2 TABLET BY MOUTH BEFORE SUPPER   HYDRALAZINE (APRESOLINE) 25 MG TABLET       LANCETS MISC    Use to check glucose   LISINOPRIL (PRINIVIL,ZESTRIL) 40 MG TABLET    Take one tablet once daily to control BP.   MEMANTINE HCL ER 7 & 14 & 21 &28 MG CP24    Take 7 mg by mouth daily. Take as directed on package to help memory   METOPROLOL (LOPRESSOR) 50 MG TABLET       NAMENDA XR 7 MG CP24    TAKE  1 CAP X 7 DAYS, 2 CAPS X 7 DAYS, 3 CAPS X 7 DAYS, AND THEN 4 CAPSULES DAILY AS DIRECTED   NAPROXEN SODIUM (ALEVE) 220 MG TABLET    One twice daily with a meal to help arthritis   SIMVASTATIN (ZOCOR) 40 MG TABLET    Take 40 mg by mouth at bedtime. Take 1 tablet once daily to lower cholesterol.   TRAZODONE (DESYREL) 150 MG TABLET    Take 1 tablet (150 mg total) by mouth at bedtime. One nightly to help sleep, anxiety, and depression  Modified Medications   No medications on file  Discontinued Medications   No medications on file     Physical Exam:  Filed Vitals:   02/06/13 1054  BP: 148/80  Pulse: 52  Temp: 97.9 F (36.6 C)  TempSrc: Oral  Weight: 178 lb (80.74 kg)  SpO2: 95%    Physical Exam  Constitutional: He is well-developed, well-nourished, and in no distress. No distress.  HENT:  Head: Normocephalic and atraumatic.  Mouth/Throat: Oropharynx is clear and moist. No oropharyngeal exudate.  Eyes: Conjunctivae and EOM are normal. Pupils are equal, round, and reactive to light.  Neck: Normal range of motion. Neck supple.  Cardiovascular: Normal rate, regular rhythm and normal heart sounds.   Pulmonary/Chest: Effort normal and breath sounds normal. No respiratory distress.  Abdominal: Soft. Bowel sounds are normal. He exhibits no distension. There is no tenderness.  Neurological: He is alert.  Skin: Skin is warm and dry. He is not diaphoretic.     Labs reviewed: Basic Metabolic Panel:  Recent Labs  16/10/96 1618  NA 142  K 4.7  CL 106  CO2 24  GLUCOSE 150*  BUN 30*  CREATININE 1.78*  CALCIUM 9.1  TSH 1.880   Liver Function Tests: No results found for this basename: AST, ALT, ALKPHOS, BILITOT, PROT, ALBUMIN,  in the last 8760 hours No results found for this basename: LIPASE, AMYLASE,  in the last 8760 hours No results found for this basename: AMMONIA,  in the last 8760 hours CBC:  Recent Labs  11/20/12 1618  WBC 5.3  NEUTROABS 3.6  HGB 12.6  HCT 36.7*   MCV 91   Lipid Panel: No results found for this basename: CHOL, HDL, LDLCALC, TRIG, CHOLHDL, LDLDIRECT,  in the last 8760 hours  Past Procedures:     Assessment/Plan 1. Acute sinusitis Will start pt on Augmentin for 10 days to follow up if dizziness does not improved  with treatment - amoxicillin-clavulanate (AUGMENTIN) 875-125 MG per tablet; Take 1 tablet by mouth 2 (two) times daily.  Dispense: 20 tablet; Refill: 0 - saccharomyces boulardii (FLORASTOR) 250 MG capsule; Take 1 capsule (250 mg total) by mouth 2 (two) times daily.  Dispense: 30 capsule Increase hydration

## 2013-02-12 ENCOUNTER — Telehealth: Payer: Self-pay

## 2013-02-12 NOTE — Telephone Encounter (Signed)
Called patient/caregiver to inform of Jessica's response. No answer, unable to leave message (no voicemail)

## 2013-02-12 NOTE — Telephone Encounter (Signed)
Needs office visit with caregiver being there with pt

## 2013-02-12 NOTE — Telephone Encounter (Signed)
Last OV 02/06/2013, patient is with increased anxiety. Trazodone is not helping. Patient was told to call if Trazodone not helping to call and he could possible have a different medication. Patient's PCP is out of office.  Message will be sent to NP Shanda Bumps) whom recently seen patient, please advise.

## 2013-02-13 NOTE — Telephone Encounter (Signed)
Called patient/caregiver again, no answer. Unable to leave message (no voicemail)

## 2013-02-13 NOTE — Telephone Encounter (Signed)
Spoke with patient's caregiver, patient scheduled to see Dr.Reed tomorrow.

## 2013-02-13 NOTE — Telephone Encounter (Signed)
Spoke with patient, patient states his caregiver will not he in until 3:30 and it is best if I call back when she is available.

## 2013-02-14 ENCOUNTER — Ambulatory Visit (INDEPENDENT_AMBULATORY_CARE_PROVIDER_SITE_OTHER): Payer: Medicare Other | Admitting: Internal Medicine

## 2013-02-14 ENCOUNTER — Other Ambulatory Visit: Payer: Self-pay | Admitting: *Deleted

## 2013-02-14 ENCOUNTER — Encounter: Payer: Self-pay | Admitting: Internal Medicine

## 2013-02-14 VITALS — BP 180/98 | HR 70 | Temp 97.5°F | Resp 16 | Ht 72.0 in | Wt 175.8 lb

## 2013-02-14 DIAGNOSIS — F0390 Unspecified dementia without behavioral disturbance: Secondary | ICD-10-CM

## 2013-02-14 DIAGNOSIS — I2510 Atherosclerotic heart disease of native coronary artery without angina pectoris: Secondary | ICD-10-CM

## 2013-02-14 DIAGNOSIS — E119 Type 2 diabetes mellitus without complications: Secondary | ICD-10-CM

## 2013-02-14 DIAGNOSIS — F4323 Adjustment disorder with mixed anxiety and depressed mood: Secondary | ICD-10-CM

## 2013-02-14 DIAGNOSIS — G4700 Insomnia, unspecified: Secondary | ICD-10-CM

## 2013-02-14 LAB — GLUCOSE, POCT (MANUAL RESULT ENTRY): POC Glucose: 99 mg/dl (ref 70–99)

## 2013-02-14 MED ORDER — DONEPEZIL HCL 5 MG PO TABS: 10.0000 mg | ORAL_TABLET | Freq: Every day | ORAL | Status: DC

## 2013-02-14 MED ORDER — MIRTAZAPINE 7.5 MG PO TABS: 7.5000 mg | ORAL_TABLET | Freq: Every day | ORAL | Status: DC

## 2013-02-14 NOTE — Progress Notes (Signed)
Patient ID: [name], male   DOB: [birth year], 79 y.o.   MRN: [id number] Location:  [facility]  Code Status: full code  Allergies  Allergen Reactions  . Diovan [Valsartan]     Chief Complaint  Patient presents with  . Acute Visit    anixety has increased for approx. 2-3 weeks, gait is very wobbly.  . Anxiety    increased BP, BS    HPI: Patient is a 79 y.o. white male lives alone seen in the office today for subacute dizziness and anxiety.   Sinus infection improved.   Still getting dizzy--had one this am.  Changed sleeping routine this past night b/c he has been sleeping in until today.   Dizziness happens in the morning.  Happened when he got out of bed this am.  Feels like pulsation in his head.  No spinning.  Feels lightheaded.  Did have his medicine this morning.  Stays with him for a while when he gets dizzy.  Still feels dizzy now.  Didn't take glucose this am.  Is on glipizide.  Sleeping worse since trazodone increased.  pill is also very large. Has life alert.  Has no one with him on the weekend.   Eats bowl of cereal with banana at lunch time.  Then caretaker comes for three hours in the evening and gives him supper or takes him out to eat.   Denies memory trouble.  Some short term memory trouble per his sister.  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   Namenda has helped him a lot. PT to come back from [facility].    Review of Systems:  Review of Systems  Constitutional: Positive for malaise/fatigue.  HENT: Negative for congestion.   Eyes: Negative for blurred vision.  Respiratory: Positive for cough. Negative for shortness of breath.   Cardiovascular: Negative for chest pain.  Gastrointestinal: Negative for abdominal pain.  Genitourinary: Negative for dysuria.  Musculoskeletal: Positive for back pain and joint pain. Negative for falls.       Balance poor  Skin: Negative for rash.  Neurological: Positive for weakness.        Unsteady gait  Endo/Heme/Allergies: Bruises/bleeds easily.  Psychiatric/Behavioral: Positive for depression and memory loss. The patient is nervous/anxious.      Past Medical History  Diagnosis Date  . Arthritis   . Depression   . Diabetes mellitus   . Hypertension   . Hyperlipidemia   . Myocardial infarction 1984  . Colon cancer   . AAA (abdominal aortic aneurysm) 2011  . CAD (coronary artery disease) 2003  . Chronic kidney disease   . Anemia   . Diverticula of colon 2013  . Hypertrophy of prostate without urinary obstruction and other lower urinary tract symptoms (LUTS) 2008  . Hyperpotassemia 2008  . Gout, unspecified 2008  . Impacted cerumen 06/20/2006  . First degree atrioventricular block 2008  . Abnormality of gait 2007  . Other specified cardiac dysrhythmias(427.89) 2005  . Unspecified disorder of kidney and ureter 2005  . Edema 2005  . Malignant neoplasm of colon, unspecified site 2004  . Unspecified hereditary and idiopathic peripheral neuropathy 2004  . Chest pain, unspecified 2003  . Pain in joint, pelvic region and thigh 2004  . Pain in limb 2003    Past Surgical History  Procedure Laterality Date  . Mediansternotomy    . Right colectomy  2004  . Coronary artery bypass graft  1996    Bartle,  MD  . Hernia repair Right 1952    In [employer]    Social History:   reports that he quit smoking about 30 years ago. His smoking use included Cigarettes. He smoked 0.00 packs per day. He has never used smokeless tobacco. He reports that  drinks alcohol. He reports that he does not use illicit drugs.  Family History  Problem Relation Age of Onset  . Heart disease Father   . Pancreatic cancer Sister   . Cancer Sister     liver  . Cancer Brother     Leukemia  . Cancer Mother     ?    Medications: Patient's Medications  New Prescriptions   No medications on file  Previous Medications   ASPIRIN 81 MG TABLET    Take 81 mg by mouth daily. Take 1 tablet  daily to prevent stroke and heart attack.   BLOOD GLUCOSE MONITORING SUPPL (BLOOD GLUCOSE METER) KIT    Use as instructed   DONEPEZIL (ARICEPT) 5 MG TABLET    Take 1 tablet (5 mg total) by mouth at bedtime.   GLIPIZIDE (GLUCOTROL) 5 MG TABLET    TAKE 1 TABLET EVERYDAY IN THE MORNING AND 1/2 TABLET BY MOUTH BEFORE SUPPER   HYDRALAZINE (APRESOLINE) 25 MG TABLET    Take 25 mg by mouth 3 (three) times daily.    LANCETS MISC    Use to check glucose   LISINOPRIL (PRINIVIL,ZESTRIL) 40 MG TABLET    Take one tablet once daily to control BP.   MEMANTINE HCL ER 7 & 14 & 21 &28 MG CP24    Take 7 mg by mouth daily. Take as directed on package to help memory   METOPROLOL (LOPRESSOR) 50 MG TABLET    Take 50 mg by mouth 2 (two) times daily.    NAMENDA XR 7 MG CP24    TAKE 1 CAP X 7 DAYS, 2 CAPS X 7 DAYS, 3 CAPS X 7 DAYS, AND THEN 4 CAPSULES DAILY AS DIRECTED   NAPROXEN SODIUM (ALEVE) 220 MG TABLET    One twice daily with a meal to help arthritis   SACCHAROMYCES BOULARDII (FLORASTOR) 250 MG CAPSULE    Take 1 capsule (250 mg total) by mouth 2 (two) times daily.   SIMVASTATIN (ZOCOR) 40 MG TABLET    Take 40 mg by mouth at bedtime. Take 1 tablet once daily to lower cholesterol.   TRAZODONE (DESYREL) 150 MG TABLET    Take 1 tablet (150 mg total) by mouth at bedtime. One nightly to help sleep, anxiety, and depression  Modified Medications   No medications on file  Discontinued Medications   AMOXICILLIN-CLAVULANATE (AUGMENTIN) 875-125 MG PER TABLET    Take 1 tablet by mouth 2 (two) times daily.     Physical Exam: Filed Vitals:   02/14/13 1029  BP: 180/98  Pulse: 70  Temp: 97.5 F (36.4 C)  TempSrc: Oral  Resp: 16  Height: 6' (1.829 m)  Weight: 175 lb 12.8 oz (79.742 kg)  SpO2: 95%  Physical Exam  Constitutional:  Tall thin frail white male, walks unsteadily, inconsistently uses cane or walker  HENT:  Head: Normocephalic and atraumatic.  Cardiovascular: Normal rate, regular rhythm, normal heart sounds  and intact distal pulses.   Pulmonary/Chest: Effort normal and breath sounds normal.  Abdominal: Soft. Bowel sounds are normal. He exhibits no distension and no mass. There is no tenderness.  Musculoskeletal: He exhibits no edema and no tenderness.  Walks with limp, stooped posture  Neurological: He is alert.  Oriented to person and place, not time  Skin: Skin is warm and dry.    Labs reviewed: Basic Metabolic Panel:  Recent Labs  30/86/57 1618  NA 142  K 4.7  CL 106  CO2 24  GLUCOSE 150*  BUN 30*  CREATININE 1.78*  CALCIUM 9.1  TSH 1.880  CBC:  Recent Labs  11/20/12 1618  WBC 5.3  NEUTROABS 3.6  HGB 12.6  HCT 36.7*  MCV 91   Assessment/Plan 1. Dementia with behavioral disturbance -cont aricept and namenda--both need to be titrated to their max doses  2. Type II or unspecified type diabetes mellitus without mention of complication, not stated as uncontrolled -check cbgs at 1130am, suspect he's having hypoglycemia - POC Glucose (CBG)  3. Adjustment disorder with mixed anxiety and depressed mood -needs an antidepressant added to his regimen and possibly a mood stabilizer  4. Insomnia, unspecified -cont trazodone  Labs/tests ordered: Orders Placed This Encounter  Procedures  . POC Glucose (CBG)   Next appt: 1 month

## 2013-03-03 ENCOUNTER — Other Ambulatory Visit: Payer: Self-pay | Admitting: Internal Medicine

## 2013-03-04 ENCOUNTER — Emergency Department (HOSPITAL_COMMUNITY): Payer: Medicare Other

## 2013-03-04 ENCOUNTER — Encounter (HOSPITAL_COMMUNITY): Payer: Self-pay | Admitting: *Deleted

## 2013-03-04 ENCOUNTER — Telehealth: Payer: Self-pay

## 2013-03-04 ENCOUNTER — Inpatient Hospital Stay (HOSPITAL_COMMUNITY)
Admission: EM | Admit: 2013-03-04 | Discharge: 2013-03-10 | DRG: 177 | Disposition: A | Discharge status: Skilled Nursing Facility | Payer: Medicare Other | Attending: Internal Medicine | Admitting: Internal Medicine

## 2013-03-04 DIAGNOSIS — J9601 Acute respiratory failure with hypoxia: Secondary | ICD-10-CM | POA: Diagnosis present

## 2013-03-04 DIAGNOSIS — Z8249 Family history of ischemic heart disease and other diseases of the circulatory system: Secondary | ICD-10-CM

## 2013-03-04 DIAGNOSIS — D72829 Elevated white blood cell count, unspecified: Secondary | ICD-10-CM | POA: Diagnosis present

## 2013-03-04 DIAGNOSIS — L602 Onychogryphosis: Secondary | ICD-10-CM

## 2013-03-04 DIAGNOSIS — I714 Abdominal aortic aneurysm, without rupture, unspecified: Secondary | ICD-10-CM

## 2013-03-04 DIAGNOSIS — K5730 Diverticulosis of large intestine without perforation or abscess without bleeding: Secondary | ICD-10-CM

## 2013-03-04 DIAGNOSIS — N182 Chronic kidney disease, stage 2 (mild): Secondary | ICD-10-CM | POA: Diagnosis present

## 2013-03-04 DIAGNOSIS — F4323 Adjustment disorder with mixed anxiety and depressed mood: Secondary | ICD-10-CM

## 2013-03-04 DIAGNOSIS — Z951 Presence of aortocoronary bypass graft: Secondary | ICD-10-CM

## 2013-03-04 DIAGNOSIS — E785 Hyperlipidemia, unspecified: Secondary | ICD-10-CM | POA: Diagnosis present

## 2013-03-04 DIAGNOSIS — D649 Anemia, unspecified: Secondary | ICD-10-CM

## 2013-03-04 DIAGNOSIS — E119 Type 2 diabetes mellitus without complications: Secondary | ICD-10-CM | POA: Diagnosis present

## 2013-03-04 DIAGNOSIS — Z66 Do not resuscitate: Secondary | ICD-10-CM | POA: Diagnosis present

## 2013-03-04 DIAGNOSIS — I2510 Atherosclerotic heart disease of native coronary artery without angina pectoris: Secondary | ICD-10-CM | POA: Diagnosis present

## 2013-03-04 DIAGNOSIS — Z87891 Personal history of nicotine dependence: Secondary | ICD-10-CM

## 2013-03-04 DIAGNOSIS — R634 Abnormal weight loss: Secondary | ICD-10-CM

## 2013-03-04 DIAGNOSIS — Z7982 Long term (current) use of aspirin: Secondary | ICD-10-CM

## 2013-03-04 DIAGNOSIS — F329 Major depressive disorder, single episode, unspecified: Secondary | ICD-10-CM | POA: Diagnosis present

## 2013-03-04 DIAGNOSIS — R413 Other amnesia: Secondary | ICD-10-CM

## 2013-03-04 DIAGNOSIS — M6282 Rhabdomyolysis: Secondary | ICD-10-CM | POA: Diagnosis present

## 2013-03-04 DIAGNOSIS — C189 Malignant neoplasm of colon, unspecified: Secondary | ICD-10-CM

## 2013-03-04 DIAGNOSIS — E1142 Type 2 diabetes mellitus with diabetic polyneuropathy: Secondary | ICD-10-CM | POA: Diagnosis present

## 2013-03-04 DIAGNOSIS — Z79899 Other long term (current) drug therapy: Secondary | ICD-10-CM

## 2013-03-04 DIAGNOSIS — Z9049 Acquired absence of other specified parts of digestive tract: Secondary | ICD-10-CM

## 2013-03-04 DIAGNOSIS — N529 Male erectile dysfunction, unspecified: Secondary | ICD-10-CM

## 2013-03-04 DIAGNOSIS — N181 Chronic kidney disease, stage 1: Secondary | ICD-10-CM | POA: Diagnosis present

## 2013-03-04 DIAGNOSIS — I440 Atrioventricular block, first degree: Secondary | ICD-10-CM | POA: Diagnosis present

## 2013-03-04 DIAGNOSIS — Z85038 Personal history of other malignant neoplasm of large intestine: Secondary | ICD-10-CM

## 2013-03-04 DIAGNOSIS — I498 Other specified cardiac arrhythmias: Secondary | ICD-10-CM

## 2013-03-04 DIAGNOSIS — I129 Hypertensive chronic kidney disease with stage 1 through stage 4 chronic kidney disease, or unspecified chronic kidney disease: Secondary | ICD-10-CM | POA: Diagnosis present

## 2013-03-04 DIAGNOSIS — J690 Pneumonitis due to inhalation of food and vomit: Principal | ICD-10-CM | POA: Diagnosis present

## 2013-03-04 DIAGNOSIS — E041 Nontoxic single thyroid nodule: Secondary | ICD-10-CM | POA: Diagnosis present

## 2013-03-04 DIAGNOSIS — N400 Enlarged prostate without lower urinary tract symptoms: Secondary | ICD-10-CM | POA: Diagnosis present

## 2013-03-04 DIAGNOSIS — J189 Pneumonia, unspecified organism: Secondary | ICD-10-CM | POA: Diagnosis present

## 2013-03-04 DIAGNOSIS — Z800 Family history of malignant neoplasm of digestive organs: Secondary | ICD-10-CM

## 2013-03-04 DIAGNOSIS — F0390 Unspecified dementia without behavioral disturbance: Secondary | ICD-10-CM

## 2013-03-04 DIAGNOSIS — M1990 Unspecified osteoarthritis, unspecified site: Secondary | ICD-10-CM

## 2013-03-04 DIAGNOSIS — G609 Hereditary and idiopathic neuropathy, unspecified: Secondary | ICD-10-CM

## 2013-03-04 DIAGNOSIS — R269 Unspecified abnormalities of gait and mobility: Secondary | ICD-10-CM

## 2013-03-04 DIAGNOSIS — M109 Gout, unspecified: Secondary | ICD-10-CM | POA: Diagnosis present

## 2013-03-04 DIAGNOSIS — I252 Old myocardial infarction: Secondary | ICD-10-CM

## 2013-03-04 DIAGNOSIS — F3289 Other specified depressive episodes: Secondary | ICD-10-CM | POA: Diagnosis present

## 2013-03-04 DIAGNOSIS — J9600 Acute respiratory failure, unspecified whether with hypoxia or hypercapnia: Secondary | ICD-10-CM

## 2013-03-04 DIAGNOSIS — I10 Essential (primary) hypertension: Secondary | ICD-10-CM | POA: Diagnosis present

## 2013-03-04 DIAGNOSIS — G4700 Insomnia, unspecified: Secondary | ICD-10-CM

## 2013-03-04 DIAGNOSIS — Z806 Family history of leukemia: Secondary | ICD-10-CM

## 2013-03-04 LAB — CBC WITH DIFFERENTIAL/PLATELET
Basophils Absolute: 0 10*3/uL (ref 0.0–0.1)
Eosinophils Relative: 0 % (ref 0–5)
HCT: 40.1 % (ref 39.0–52.0)
Hemoglobin: 14 g/dL (ref 13.0–17.0)
Lymphocytes Relative: 3 % — ABNORMAL LOW (ref 12–46)
Lymphs Abs: 0.4 10*3/uL — ABNORMAL LOW (ref 0.7–4.0)
MCV: 90.3 fL (ref 78.0–100.0)
Monocytes Absolute: 0.6 10*3/uL (ref 0.1–1.0)
Monocytes Relative: 4 % (ref 3–12)
Neutro Abs: 12.4 10*3/uL — ABNORMAL HIGH (ref 1.7–7.7)
Neutrophils Relative %: 93 % — ABNORMAL HIGH (ref 43–77)
Platelets: 160 10*3/uL (ref 150–400)
RBC: 4.44 MIL/uL (ref 4.22–5.81)
WBC: 13.4 10*3/uL — ABNORMAL HIGH (ref 4.0–10.5)

## 2013-03-04 LAB — COMPREHENSIVE METABOLIC PANEL
ALT: 10 U/L (ref 0–53)
AST: 20 U/L (ref 0–37)
Alkaline Phosphatase: 75 U/L (ref 39–117)
CO2: 25 mEq/L (ref 19–32)
Calcium: 9.6 mg/dL (ref 8.4–10.5)
Chloride: 103 mEq/L (ref 96–112)
Creatinine, Ser: 1.87 mg/dL — ABNORMAL HIGH (ref 0.50–1.35)
GFR calc Af Amer: 37 mL/min — ABNORMAL LOW (ref >90)
GFR calc non Af Amer: 32 mL/min — ABNORMAL LOW (ref >90)
Glucose, Bld: 169 mg/dL — ABNORMAL HIGH (ref 70–99)
Potassium: 4.4 mEq/L (ref 3.5–5.1)
Total Bilirubin: 0.4 mg/dL (ref 0.3–1.2)

## 2013-03-04 LAB — BLOOD GAS, ARTERIAL
Bicarbonate: 25.6 mEq/L — ABNORMAL HIGH (ref 20.0–24.0)
Drawn by: 23404
O2 Content: 5 L/min
O2 Saturation: 94.8 %
Patient temperature: 98.6
pH, Arterial: 7.414 (ref 7.350–7.450)

## 2013-03-04 LAB — PROTIME-INR
INR: 1.08 (ref 0.00–1.49)
Prothrombin Time: 13.8 seconds (ref 11.6–15.2)

## 2013-03-04 MED ORDER — MIRTAZAPINE 7.5 MG PO TABS
7.5000 mg | ORAL_TABLET | Freq: Every day | ORAL | Status: DC
  Administered 2013-03-04: 7.5 mg via ORAL
  Filled 2013-03-04 (×2): qty 1

## 2013-03-04 MED ORDER — INSULIN ASPART 100 UNIT/ML ~~LOC~~ SOLN
0.0000 [IU] | Freq: Three times a day (TID) | SUBCUTANEOUS | Status: DC
  Administered 2013-03-05 – 2013-03-06 (×2): 1 [IU] via SUBCUTANEOUS

## 2013-03-04 MED ORDER — HYDRALAZINE HCL 20 MG/ML IJ SOLN
10.0000 mg | Freq: Four times a day (QID) | INTRAMUSCULAR | Status: DC | PRN
  Administered 2013-03-09 – 2013-03-10 (×3): 10 mg via INTRAVENOUS
  Filled 2013-03-04 (×4): qty 1

## 2013-03-04 MED ORDER — ENOXAPARIN SODIUM 30 MG/0.3ML ~~LOC~~ SOLN
30.0000 mg | SUBCUTANEOUS | Status: DC
  Administered 2013-03-04 – 2013-03-05 (×2): 30 mg via SUBCUTANEOUS
  Filled 2013-03-04 (×3): qty 0.3

## 2013-03-04 MED ORDER — DEXTROSE 5 % IV SOLN
1.0000 g | INTRAVENOUS | Status: DC
  Administered 2013-03-04: 1 g via INTRAVENOUS
  Filled 2013-03-04 (×2): qty 1

## 2013-03-04 MED ORDER — DONEPEZIL HCL 10 MG PO TABS
10.0000 mg | ORAL_TABLET | Freq: Every day | ORAL | Status: DC
  Administered 2013-03-04 – 2013-03-09 (×6): 10 mg via ORAL
  Filled 2013-03-04 (×7): qty 1

## 2013-03-04 MED ORDER — VANCOMYCIN HCL IN DEXTROSE 1-5 GM/200ML-% IV SOLN
1000.0000 mg | INTRAVENOUS | Status: DC
  Administered 2013-03-04 – 2013-03-08 (×5): 1000 mg via INTRAVENOUS
  Filled 2013-03-04 (×6): qty 200

## 2013-03-04 MED ORDER — HYDROMORPHONE HCL PF 1 MG/ML IJ SOLN
1.0000 mg | INTRAMUSCULAR | Status: DC | PRN
  Filled 2013-03-04: qty 1

## 2013-03-04 MED ORDER — ASPIRIN 81 MG PO TABS
81.0000 mg | ORAL_TABLET | Freq: Every day | ORAL | Status: DC
  Administered 2013-03-05 – 2013-03-09 (×5): 81 mg via ORAL
  Filled 2013-03-04 (×6): qty 1

## 2013-03-04 MED ORDER — SODIUM CHLORIDE 0.9 % IV SOLN
INTRAVENOUS | Status: AC
  Administered 2013-03-04: 22:00:00 via INTRAVENOUS

## 2013-03-04 MED ORDER — ONDANSETRON HCL 4 MG/2ML IJ SOLN
4.0000 mg | Freq: Three times a day (TID) | INTRAMUSCULAR | Status: DC | PRN
  Filled 2013-03-04: qty 2

## 2013-03-04 NOTE — ED Notes (Signed)
Returned from xray

## 2013-03-04 NOTE — ED Notes (Signed)
Patient transported to CT 

## 2013-03-04 NOTE — H&P (Addendum)
[facility] History and Physical  [name] ZOX:[id number] DOB: 03/16/1928 DOA: 03/04/2013  Referring physician: ED physician PCP: [name], MD   Chief Complaint: syncope   HPI:  Pt is 79 yo male who was brought to [facility] ED after found on the floor at home and unresponsive. It is unknown how long pt has been on the floor and pt is not able to provide details. He reports feeling week for the past week, has had subjective fevers, chills, cough and malaise. He also reports several days duration of abdominal discomfort with nausea and poor oral intake. Family reports noticing vomiting after they found him on the floor. He denies similar events in the past, denies chest pain, no specific abdominal concerns at this time and no urinary concerns. He denies focal neurological symptoms, no headaches and no visual changes.   In ED, pt found to be hypoxic with saturations in mid 80's on RA but quickly responded to oxygen via [place] 4 L. CXR worrisome for PNA. TR[facility] asked to admit for further evaluation.   Assessment and Plan:  Principal Problem:   Acute respiratory failure with hypoxia - this is most likely secondary to PNA, ? Aspiration PNA as pt apparently did have one episode of vomiting - will cover broadly with Vanc and Maxipime - admit to SDU and provide supportive care - will check ABG as pt requiring 4-6 L [place]  Active Problems:   Syncope and fall  - monitor in SDU, monitor vitals closely - cycle CE's, TSH, 12 lead EKG - PT evaluation once pt more stable    PNA (pneumonia) - noted on imaging study, possible aspiration in the setting of fall and unresponsive - will cover broadly with Vancomycin and Maxipime and plan on tapering down as clinically indicated  - sputum analysis ordered, strep pneumo and urine legionella  - currently requiring 4 L [place]   Leukocytosis - secondary to PNA and stress/demargination - ABX as noted above and repeat CBC in AM - blood culture ordered and  will need to follow up on results    DM - check A1C - place on SSI for now   CHRONIC KIDNEY DISEASE STAGE I - last Cr in June 2014 was ~ 1.7, so this new elevation is rather mild and possibly progression of renal disease - will place on IVF, gentle hydration and will hold lisinopril and naproxen for now until renal function improves  - repeat BMP in AM   Abdominal aneurysm without mention of rupture - CT abd/pelvis with contrast recommended but due to slightly worsening Cr will hold off for now - will reconsider CT scan with contrast in AM if renal function improves - will provide IVF for next 12 hours and repeat BMP In AM   HYPERLIPIDEMIA - pt on statin, CK is elevated and this is most likely secondary to rhabdomyolysis in the setting of fall and immobility   - will check lipid panel but will hold statin for now - recheck CK in AM and if trending down consider restarting statin    Rhabdomyolysis - secondary to fall and immobility, rather than statin induced - will provide IVF and will repeat CK level in AM   HYPERTENSION - stable BP on admission - pt is on lisinopril but given slight elevation in Cr will hold lisinopril and place on PRN hydralazine - once renal function stabilizes consider placing back on Lisinopril    S/P right colectomy - pt has history of colon cancer  Code Status: Full Family Communication: No family at bedside  Disposition Plan: Admit to SDU  Review of Systems:  Constitutional: Negative for diaphoresis.  HENT: Negative for hearing loss, ear pain, nosebleeds, congestion, sore throat, neck pain, tinnitus and ear discharge.   Eyes: Negative for blurred vision, double vision, photophobia, pain, discharge and redness.  Respiratory: Negative for hemoptysis, wheezing and stridor.   Cardiovascular: Negative for chest pain, palpitations, orthopnea, claudication and leg swelling.  Gastrointestinal: Negative for heartburn, constipation, blood in stool and melena.   Genitourinary: Negative for dysuria, urgency, frequency, hematuria and flank pain.  Musculoskeletal: Negative for myalgias, back pain.  Skin: Negative for itching and rash.  Neurological: Negative for tingling, tremors, sensory change, speech change, focal weakness, and headaches.  Endo/Heme/Allergies: Negative for environmental allergies and polydipsia. Does not bruise/bleed easily.  Psychiatric/Behavioral: Negative for suicidal ideas. The patient is not nervous/anxious.      Past Medical History  Diagnosis Date  . Arthritis   . Depression   . Diabetes mellitus   . Hypertension   . Hyperlipidemia   . Myocardial infarction 1984  . Colon cancer   . AAA (abdominal aortic aneurysm) 2011  . CAD (coronary artery disease) 2003  . Chronic kidney disease   . Anemia   . Diverticula of colon 2013  . Hypertrophy of prostate without urinary obstruction and other lower urinary tract symptoms (LUTS) 2008  . Hyperpotassemia 2008  . Gout, unspecified 2008  . Impacted cerumen 06/20/2006  . First degree atrioventricular block 2008  . Abnormality of gait 2007  . Other specified cardiac dysrhythmias(427.89) 2005  . Unspecified disorder of kidney and ureter 2005  . Edema 2005  . Malignant neoplasm of colon, unspecified site 2004  . Unspecified hereditary and idiopathic peripheral neuropathy 2004  . Chest pain, unspecified 2003  . Pain in joint, pelvic region and thigh 2004  . Pain in limb 2003    Past Surgical History  Procedure Laterality Date  . Mediansternotomy    . Right colectomy  2004  . Coronary artery bypass graft  1996    Bartle, MD  . Hernia repair Right 1952    In [employer]    Social History:  reports that he quit smoking about 30 years ago. His smoking use included Cigarettes. He smoked 0.00 packs per day. He has never used smokeless tobacco. He reports that  drinks alcohol. He reports that he does not use illicit drugs.  Allergies  Allergen Reactions  . Diovan [Valsartan]      Family History  Problem Relation Age of Onset  . Heart disease Father   . Pancreatic cancer Sister   . Cancer Sister     liver  . Cancer Brother     Leukemia  . Cancer Mother     ?    Medication Sig  aspirin 81 MG tablet Take 81 mg by mouth daily.   donepezil (ARICEPT) 5 MG tablet Take 2 tablets at bedtime.  glipiZIDE 5 MG tablet  Takes 1 tab in am and 1/2 tab in pm  lisinopril  40 MG tablet Take one tablet once daily to control BP.  mirtazapine 7.5 MG tablet Take 1 tablet (7.5 mg total) by mouth at bedtime.  NAMENDA XR 7 MG CP24 4 CAPSULES DAILY AS DIRECTED  naproxen sodium 220 MG tablet One twice daily with a meal to help arthritis  simvastatin (ZOCOR) 40 MG tablet Take 40 mg by mouth at bedtime.    Physical Exam: Filed Vitals:  03/04/13 1900 03/04/13 1915 03/04/13 1929 03/04/13 1930  BP: 146/47 136/52 136/52 150/125  Pulse: 62 63 96 71  Temp:   98.5 F (36.9 C)   TempSrc:   Oral   Resp: 18 21 22 15   SpO2: 91% 91% 96% 96%    Physical Exam  Constitutional: Appears stable and in no distress  HENT: Normocephalic. External right and left ear normal. Dry MM Eyes: Conjunctivae and EOM are normal. PERRLA, no scleral icterus.  Neck: Normal ROM. Neck supple. No JVD. No tracheal deviation. No thyromegaly.  CVS: RRR, S1/S2 +, no murmurs, no gallops, no carotid bruit.  Pulmonary: Course breath sounds bilaterally with rhonchi and productive cough noted on exam, no wheezing  Abdominal: Soft. BS +,  no distension, tenderness, rebound or guarding.  Musculoskeletal: Normal range of motion. No edema and no tenderness.  Lymphadenopathy: No lymphadenopathy noted, cervical, inguinal. Neuro: Alert. Normal reflexes, muscle tone coordination. No cranial nerve deficit. Skin: Skin is warm and dry. No rash noted. Not diaphoretic. No erythema. No pallor.  Psychiatric: Normal mood and affect. Behavior, judgment, thought content normal.   Labs on Admission:  Basic Metabolic  Panel:  Recent Labs Lab 03/04/13 1720  NA 140  K 4.4  CL 103  CO2 25  GLUCOSE 169*  BUN 24*  CREATININE 1.87*  CALCIUM 9.6   Liver Function Tests:  Recent Labs Lab 03/04/13 1720  AST 20  ALT 10  ALKPHOS 75  BILITOT 0.4  PROT 6.7  ALBUMIN 3.5   CBC:  Recent Labs Lab 03/04/13 1720  WBC 13.4*  NEUTROABS 12.4*  HGB 14.0  HCT 40.1  MCV 90.3  PLT 160   Cardiac Enzymes:  Recent Labs Lab 03/04/13 1720  CKTOTAL 407*  TROPONINI <0.30    Radiological Exams on Admission: Dg Chest 2 View   03/04/2013    1. Question of right lower lobe mass. CT of the chest is recommended. Contrast administration is recommended unless contraindicated.  2. Right lower lobe infiltrate, possibly postobstructive.  3. Multiple old rib fractures.     Ct Head Wo Contrast  03/04/2013  No skull fracture or intracranial hemorrhage.  Atrophy. Ventricular prominence may be explained by atrophy although hydrocephalus not excluded. Appears similar to the prior exam.  Small vessel disease type changes without CT evidence of large acute infarct.  No intracranial mass lesion noted on this unenhanced exam. .  Vascular calcifications.  Partial opacification maxillary sinuses with air-fluid level on the right. No other findings to suggest orbital floor for are fracture.  CT CERVICAL SPINE FINDINGS  03/04/2013 Cervical spondylotic changes appear similar to prior exam as does alignment. No cervical spine fracture noted.  Biapical lung parenchymal changes may represent scarring more notable on the left unchanged.  Right thyroid 2 cm lesion unchanged. This can be assessed with elective thyroid ultrasound.  Carotid bifurcation calcifications.   US Aorta  03/04/2013    Increase in size of the infrarenal abdominal aortic aneurysm, now with maximum transverse diameter 5.5 cm (formerly 5.1 cm). Please note that this exam is fine for following aneurysm size, but if there is concern for leak or acute injury to the  abdominal aorta then CT scan (with contrast a feasible) would offer greater sensitivity for acute aortic injury and would be the diagnostic examination of choice.    EKG: Normal sinus rhythm, no ST/T wave changes  Debbora Presto, MD  [facility] Pager [phone]  If 7PM-7AM, please contact night-coverage [email or web address] Password [facility] 03/04/2013, 7:42 PM

## 2013-03-04 NOTE — Progress Notes (Signed)
Pt NTS for aspiration and rhonchi heard on ascultation. Moderate amount of tenacious white and blood tinged sputum obtained. RT will continue to monitor.

## 2013-03-04 NOTE — Consult Note (Signed)
ANTIBIOTIC CONSULT NOTE - INITIAL  Pharmacy Consult for Vancomycin Indication: pneumonia  Allergies  Allergen Reactions  . Diovan [Valsartan]     Patient Measurements: Height: 6' 0.05" (183 cm) Weight: 175 lb 11.3 oz (79.7 kg) IBW/kg (Calculated) : 77.71  Vital Signs: Temp: 98.5 F (36.9 C) (09/23 1929) Temp src: Oral (09/23 1929) BP: 141/62 mmHg (09/23 2045) Pulse Rate: 61 (09/23 2045) Intake/Output from previous day:   Intake/Output from this shift:    Labs:  Recent Labs  03/04/13 1720  WBC 13.4*  HGB 14.0  PLT 160  CREATININE 1.87*   Estimated Creatinine Clearance: 32.9 ml/min (by C-G formula based on Cr of 1.87).  Microbiology: No results found for this or any previous visit (from the past 720 hour(s)).  Medical History: Past Medical History  Diagnosis Date  . Arthritis   . Depression   . Diabetes mellitus   . Hypertension   . Hyperlipidemia   . Myocardial infarction 1984  . Colon cancer   . AAA (abdominal aortic aneurysm) 2011  . CAD (coronary artery disease) 2003  . Chronic kidney disease   . Anemia   . Diverticula of colon 2013  . Hypertrophy of prostate without urinary obstruction and other lower urinary tract symptoms (LUTS) 2008  . Hyperpotassemia 2008  . Gout, unspecified 2008  . Impacted cerumen 06/20/2006  . First degree atrioventricular block 2008  . Abnormality of gait 2007  . Other specified cardiac dysrhythmias(427.89) 2005  . Unspecified disorder of kidney and ureter 2005  . Edema 2005  . Malignant neoplasm of colon, unspecified site 2004  . Unspecified hereditary and idiopathic peripheral neuropathy 2004  . Chest pain, unspecified 2003  . Pain in joint, pelvic region and thigh 2004  . Pain in limb 2003   Assessment: 85yom presented to the ED s/p fall today, also at some point had N/V and SOB. CXR worrisome for pneumonia, likely aspiration. He will begin broad spectrum antibiotics. He has CKD with sCr 1.87 (baseline ~ 1.7) and  CrCl 52ml/min.  Goal of Therapy:  Vancomycin trough level 15-20 mcg/ml  Plan:  1) Vancomycin 1000mg  IV q24 2) Change Cefepime to 1g IV q24 3) Follow renal function, cultures, LOT, trough at steady state  Fredrik Rigger 03/04/2013,9:06 PM

## 2013-03-04 NOTE — ED Notes (Signed)
Pt arrived by [facility]. Pt had a fall sometime yesterday evening, last seen by family yesterday at 4:30pm. At some point had n/v and possible aspiration, now having sob.

## 2013-03-04 NOTE — ED Notes (Signed)
1900  Received report from off going RN and introduced self to the pt.  Pt is relaxing in the bed with family at bedside  2000  Pt is repositioned in the bed

## 2013-03-04 NOTE — ED Provider Notes (Signed)
CSN: [id number]     Arrival date & time 03/04/13  1529 History   First MD Initiated Contact with Patient 03/04/13 1529     No chief complaint on file.  (Consider location/radiation/quality/duration/timing/severity/associated sxs/prior Treatment) Patient is a 79 y.o. male presenting with fall. The history is provided by the patient, the EMS personnel and a relative. No language interpreter was used.  Fall This is a new problem. The current episode started yesterday. The problem occurs rarely. The problem has been gradually improving. Associated symptoms include headaches. Pertinent negatives include no abdominal pain, chest pain, congestion, coughing, fever, nausea, rash, sore throat or vomiting. Nothing aggravates the symptoms. He has tried nothing for the symptoms. The treatment provided no relief.    Past Medical History  Diagnosis Date  . Arthritis   . Depression   . Diabetes mellitus   . Hypertension   . Hyperlipidemia   . Myocardial infarction 1984  . Colon cancer   . AAA (abdominal aortic aneurysm) 2011  . CAD (coronary artery disease) 2003  . Chronic kidney disease   . Anemia   . Diverticula of colon 2013  . Hypertrophy of prostate without urinary obstruction and other lower urinary tract symptoms (LUTS) 2008  . Hyperpotassemia 2008  . Gout, unspecified 2008  . Impacted cerumen 06/20/2006  . First degree atrioventricular block 2008  . Abnormality of gait 2007  . Other specified cardiac dysrhythmias(427.89) 2005  . Unspecified disorder of kidney and ureter 2005  . Edema 2005  . Malignant neoplasm of colon, unspecified site 2004  . Unspecified hereditary and idiopathic peripheral neuropathy 2004  . Chest pain, unspecified 2003  . Pain in joint, pelvic region and thigh 2004  . Pain in limb 2003   Past Surgical History  Procedure Laterality Date  . Mediansternotomy    . Right colectomy  2004  . Coronary artery bypass graft  1996    Bartle, MD  . Hernia repair Right  1952    In [employer]   Family History  Problem Relation Age of Onset  . Heart disease Father   . Pancreatic cancer Sister   . Cancer Sister     liver  . Cancer Brother     Leukemia  . Cancer Mother     ?   History  Substance Use Topics  . Smoking status: Former Smoker    Types: Cigarettes    Quit date: 06/12/1982  . Smokeless tobacco: Never Used  . Alcohol Use: Yes    Review of Systems  Constitutional: Negative for fever.  HENT: Negative for congestion, sore throat and rhinorrhea.   Respiratory: Negative for cough and shortness of breath.   Cardiovascular: Negative for chest pain.  Gastrointestinal: Negative for nausea, vomiting, abdominal pain and diarrhea.  Genitourinary: Negative for dysuria and hematuria.  Skin: Negative for rash.  Neurological: Positive for syncope and headaches. Negative for light-headedness.  All other systems reviewed and are negative.    Allergies  Diovan  Home Medications   Current Outpatient Rx  Name  Route  Sig  Dispense  Refill  . aspirin 81 MG tablet   Oral   Take 81 mg by mouth daily. Take 1 tablet daily to prevent stroke and heart attack.         . Blood Glucose Monitoring Suppl (BLOOD GLUCOSE METER) kit      Use as instructed   1 each   0   . donepezil (ARICEPT) 5 MG tablet   Oral   Take  2 tablets (10 mg total) by mouth at bedtime.   180 tablet   3   . glipiZIDE (GLUCOTROL) 5 MG tablet      TAKE 1 TABLET EVERYDAY IN THE MORNING AND 1/2 TABLET BY MOUTH BEFORE SUPPER   135 tablet   1   . hydrALAZINE (APRESOLINE) 25 MG tablet   Oral   Take 25 mg by mouth 3 (three) times daily.          . Lancets MISC      Use to check glucose   100 each   5   . lisinopril (PRINIVIL,ZESTRIL) 40 MG tablet      Take one tablet once daily to control BP.         Marland Kitchen Memantine HCl ER 7 & 14 & 21 &28 MG CP24   Oral   Take 7 mg by mouth daily. Take as directed on package to help memory   28 capsule   0   . metoprolol  (LOPRESSOR) 50 MG tablet   Oral   Take 50 mg by mouth 2 (two) times daily.          . mirtazapine (REMERON) 7.5 MG tablet   Oral   Take 1 tablet (7.5 mg total) by mouth at bedtime.   30 tablet   3   . NAMENDA XR 7 MG CP24      TAKE 1 CAP X 7 DAYS, 2 CAPS X 7 DAYS, 3 CAPS X 7 DAYS, AND THEN 4 CAPSULES DAILY AS DIRECTED   70 capsule   0   . naproxen sodium (ALEVE) 220 MG tablet      One twice daily with a meal to help arthritis   100 tablet   5   . saccharomyces boulardii (FLORASTOR) 250 MG capsule   Oral   Take 1 capsule (250 mg total) by mouth 2 (two) times daily.   30 capsule   0   . simvastatin (ZOCOR) 40 MG tablet   Oral   Take 40 mg by mouth at bedtime. Take 1 tablet once daily to lower cholesterol.          There were no vitals taken for this visit. Physical Exam  Nursing note and vitals reviewed. Constitutional: He is oriented to person, place, and time. He appears well-developed and well-nourished.  HENT:  Head: Normocephalic and atraumatic.  Right Ear: External ear normal.  Left Ear: External ear normal.  Eyes: EOM are normal.  Neck: Normal range of motion. Neck supple.  Cardiovascular: Normal rate, regular rhythm and intact distal pulses.  Exam reveals no gallop and no friction rub.   No murmur heard. Pulmonary/Chest: Effort normal and breath sounds normal. No respiratory distress. He has no wheezes. He has no rales. He exhibits no tenderness.  Abdominal: Soft. Bowel sounds are normal. He exhibits no distension. There is no tenderness. There is no rebound.  Musculoskeletal: Normal range of motion. He exhibits no edema and no tenderness.  Lymphadenopathy:    He has no cervical adenopathy.  Neurological: He is alert and oriented to person, place, and time.  Skin: Skin is warm. No rash noted.  Psychiatric: He has a normal mood and affect. His behavior is normal.    ED Course  Procedures (including critical care time) Labs Review Labs Reviewed  CBC  WITH DIFFERENTIAL - Abnormal; Notable for the following:    WBC 13.4 (*)    Neutrophils Relative % 93 (*)    Neutro Abs 12.4 (*)  Lymphocytes Relative 3 (*)    Lymphs Abs 0.4 (*)    All other components within normal limits  COMPREHENSIVE METABOLIC PANEL - Abnormal; Notable for the following:    Glucose, Bld 169 (*)    BUN 24 (*)    Creatinine, Ser 1.87 (*)    GFR calc non Af Amer 32 (*)    GFR calc Af Amer 37 (*)    All other components within normal limits  CK - Abnormal; Notable for the following:    Total CK 407 (*)    All other components within normal limits  BLOOD GAS, ARTERIAL - Abnormal; Notable for the following:    pO2, Arterial 75.1 (*)    Bicarbonate 25.6 (*)    All other components within normal limits  CULTURE, BLOOD (ROUTINE X 2)  CULTURE, BLOOD (ROUTINE X 2)  CULTURE, EXPECTORATED SPUTUM-ASSESSMENT  GRAM STAIN  TROPONIN I  PROTIME-INR  APTT  TROPONIN I  TROPONIN I  TROPONIN I  LEGIONELLA ANTIGEN, URINE  STREP PNEUMONIAE URINARY ANTIGEN  MAGNESIUM  PRO B NATRIURETIC PEPTIDE  TSH  BASIC METABOLIC PANEL  CBC  HEMOGLOBIN A1C  LIPID PANEL  CK   Imaging Review Dg Chest 2 View  03/04/2013   CLINICAL DATA:  Fall, cough.  EXAM: CHEST  2 VIEW  COMPARISON:  09/06/2012  FINDINGS: The patient has had median sternotomy and CABG. The heart is mildly enlarged. Numerous old bilateral rib fractures are noted. There is increased density in the right infrahilar region, raising the question of adenopathy or mass. There is accentuation of markings at the right lung base, consistent with infiltrate, possibly postobstructive. Further evaluation with chest CT is recommended.  IMPRESSION: 1. Question of right lower lobe mass. CT of the chest is recommended. Contrast administration is recommended unless contraindicated. 2. Right lower lobe infiltrate, possibly postobstructive. 3. Multiple old rib fractures.   Electronically Signed   By: Rosalie Gums M.D.   On: 03/04/2013 17:11    Ct Head Wo Contrast  03/04/2013   CLINICAL DATA:  Fall.  EXAM: CT HEAD WITHOUT CONTRAST  CT CERVICAL SPINE WITHOUT CONTRAST  TECHNIQUE: Multidetector CT imaging of the head and cervical spine was performed following the standard protocol without intravenous contrast. Multiplanar CT image reconstructions of the cervical spine were also generated.  COMPARISON:  08/19/2012.  FINDINGS: CT HEAD FINDINGS  No skull fracture or intracranial hemorrhage.  Atrophy. Ventricular prominence may be explained by atrophy although hydrocephalus not excluded. Appears similar to the prior exam.  Small vessel disease type changes without CT evidence of large acute infarct.  No intracranial mass lesion noted on this unenhanced exam. .  Vascular calcifications.  Partial opacification maxillary sinuses with air-fluid level on the right. No other findings to suggest orbital floor for are fracture. Clinical correlation recommended.  CT CERVICAL SPINE FINDINGS  Cervical spondylotic changes appear similar to prior exam as does alignment. No cervical spine fracture noted.  Biapical lung parenchymal changes may represent scarring more notable on the left unchanged.  Right thyroid 2 cm lesion unchanged. This can be assessed with elective thyroid ultrasound.  Carotid bifurcation calcifications.  IMPRESSION: CT HEAD IMPRESSION  No skull fracture or intracranial hemorrhage.  Atrophy. Ventricular prominence may be explained by atrophy although hydrocephalus not excluded. Appears similar to the prior exam.  Small vessel disease type changes without CT evidence of large acute infarct.  Partial opacification maxillary sinuses with air-fluid level on the right. No other findings to suggest orbital floor for are  fracture. Clinical correlation recommended.  CT CERVICAL SPINE IMPRESSION  Cervical spondylotic changes appear similar to prior exam as does alignment. No cervical spine fracture noted.  Biapical lung parenchymal changes may represent  scarring more notable on the left unchanged.  Right thyroid 2 cm lesion unchanged. This can be assessed with elective thyroid ultrasound.   Electronically Signed   By: Bridgett Larsson   On: 03/04/2013 17:12   Ct Cervical Spine Wo Contrast  03/04/2013   CLINICAL DATA:  Fall.  EXAM: CT HEAD WITHOUT CONTRAST  CT CERVICAL SPINE WITHOUT CONTRAST  TECHNIQUE: Multidetector CT imaging of the head and cervical spine was performed following the standard protocol without intravenous contrast. Multiplanar CT image reconstructions of the cervical spine were also generated.  COMPARISON:  08/19/2012.  FINDINGS: CT HEAD FINDINGS  No skull fracture or intracranial hemorrhage.  Atrophy. Ventricular prominence may be explained by atrophy although hydrocephalus not excluded. Appears similar to the prior exam.  Small vessel disease type changes without CT evidence of large acute infarct.  No intracranial mass lesion noted on this unenhanced exam. .  Vascular calcifications.  Partial opacification maxillary sinuses with air-fluid level on the right. No other findings to suggest orbital floor for are fracture. Clinical correlation recommended.  CT CERVICAL SPINE FINDINGS  Cervical spondylotic changes appear similar to prior exam as does alignment. No cervical spine fracture noted.  Biapical lung parenchymal changes may represent scarring more notable on the left unchanged.  Right thyroid 2 cm lesion unchanged. This can be assessed with elective thyroid ultrasound.  Carotid bifurcation calcifications.  IMPRESSION: CT HEAD IMPRESSION  No skull fracture or intracranial hemorrhage.  Atrophy. Ventricular prominence may be explained by atrophy although hydrocephalus not excluded. Appears similar to the prior exam.  Small vessel disease type changes without CT evidence of large acute infarct.  Partial opacification maxillary sinuses with air-fluid level on the right. No other findings to suggest orbital floor for are fracture. Clinical  correlation recommended.  CT CERVICAL SPINE IMPRESSION  Cervical spondylotic changes appear similar to prior exam as does alignment. No cervical spine fracture noted.  Biapical lung parenchymal changes may represent scarring more notable on the left unchanged.  Right thyroid 2 cm lesion unchanged. This can be assessed with elective thyroid ultrasound.   Electronically Signed   By: Bridgett Larsson   On: 03/04/2013 17:12   US Aorta  03/04/2013   CLINICAL DATA:  Abdominal aortic aneurysm. Fall.  EXAM: ULTRASOUND OF ABDOMINAL AORTA  TECHNIQUE: Ultrasound examination of the abdominal aorta was performed to evaluate for abdominal aortic aneurysm.  COMPARISON:  12/11/2011  FINDINGS: Abdominal Aorta  Fusiform infrarenal abdominal aortic aneurysm with chronic anterior mural thrombus  Maximum AP  Diameter:  4.8 cm  Maximum TRV  Diameter: 5.5 cm  Right common iliac artery 1.9 cm in AP dimension. The left common iliac artery AP diameter 1.7 cm.  IMPRESSION: 1. Increase in size of the infrarenal abdominal aortic aneurysm, now with maximum transverse diameter 5.5 cm (formerly 5.1 cm). Please note that this exam is fine for following aneurysm size, but if there is concern for leak or acute injury to the abdominal aorta then CT scan (with contrast a feasible) would offer greater sensitivity for acute aortic injury and would be the diagnostic examination of choice.   Electronically Signed   By: Herbie Baltimore   On: 03/04/2013 18:34    MDM   1. Acute respiratory failure with hypoxia   2. Abdominal aneurysm without mention of  rupture   3. Abnormality of gait    3:30 PM Pt is a 79 y.o. male with pertinent PMHX of AAA, CAD, CKD, HTN,DM, HLD who presents to the ED with syncope and fall. Pt presents with syncopal episode prior to falling yesterday. Preceding aura: none. Not similar to previous episodes. Pt denies chest pain, palpitations,lightheadedness. Pt denies focal neurologic deficits.Last seen by family 4:30PM yesterday.  Family came to check on pt today at 3:00PM, pt found on the floor with vomitus around his mouth with confusion from baseline. Pt found laying on the floor. Pt had not taken his night dose of medication. Unknown when pt fell.  On Exam: AFVSS, chest non tender, abdomen non tender, pulsatile mass noted in abdomen. TTP forehead. No pain with ROM of hip. Neurologic exam:  CN I-XII: grossly intact, Sensation: normal in upper and lower extremities, Strength 5/5 in both upper and lower extremities, Coordination intact. Plan for bedside evaluation of AAA. EKG, CT head wo contrast, CT cervical spine wo contrast. CXR PA/LAt. CBC, CMP, CK, troponin  EKG personally reviewed by myself showed NSR, first degree AV block Rate of 68, PR , QRS 85ms QT/QTC 393/466ms, right axis, without evidence of new ischemia. No Comparison, indication: syncope  Study: Limited Ultrasound of the Abdominal Aorta.   Indication: history of AAA.  Multiple views of the abdominal aorta are obtained from the diaphragmatic hiatus to the aortic bifurcation in transverse and sagittal planes with a multi-frequency probe.   Performed and interpreted by: myself.  Images are archived.  Findings include: AAA  Study limited by: pain  Interpretation: infrarenal AAA  Comments: 4.3 cm diameter  CPT Code: 16109-60 (limited retroperitoneal)  CXR PA/LAT for fall, cough per my read showed possible right lower lobe mass and pneumonia. CT head wo contrast negative for skull fracture or intracranial hemorrhage. CT cervical spine wo contrast negative for acute fracture or dislocation.   Will obtain US aorta. Given concern for right lower lobe mass, if Cr allows, will obtain CT chest abdomen pelvis w contrast to evaluate the lung mass and for possible internal injuries and AAA.  Per family pt has only one kidney.  Review of Labs: CBC: showed leukocytosis, H&H stable CMP: no electrolyte abnormalities, BUN 24, Cr 1.87. Given 1 kidney and poor  GFR will hold off on infused CT imaging BMP coags within normal limits CK: elevated 407 Troponin: <0.3  US aorta showed interval increased from 5.1 to 5.5 infrarenal AAA.  Given syncopal episode prior to fall, possible pneumonia and increase in size of infrarenal AAA plan for admission to hospitalist service for further evaluation and management.  The patient appears reasonably stabilized for admission considering the current resources, flow, and capabilities available in the ED at this time, and I doubt any other [facility] requiring further screening and/or treatment in the ED prior to admission.   Plan for admission to hospitalist for further evaluation and management. PT transferred to floor VSS.  Labs, EKG and imaging reviewed by myself and considered in medical decision making if ordered.  Imaging interpreted by radiology. Pt was discussed with my attending, Dr. Bjorn Loser, MD 03/04/13 [phone]

## 2013-03-04 NOTE — Telephone Encounter (Signed)
Dianna called from [facility] indicating that they are trying to d/c home health. Patient will not answer the door or his phone. Dianna spoke with patient's sister that informed them patient is ok, patient is in one of his moods and does not want to be bothered. This is a FYI call for patient's PCP  Message will be sent to Dr.Green

## 2013-03-05 LAB — CBC
HCT: 38.7 % — ABNORMAL LOW (ref 39.0–52.0)
Hemoglobin: 13.5 g/dL (ref 13.0–17.0)
MCH: 31.4 pg (ref 26.0–34.0)
MCHC: 34.9 g/dL (ref 30.0–36.0)
MCV: 90 fL (ref 78.0–100.0)
RDW: 13.6 % (ref 11.5–15.5)

## 2013-03-05 LAB — GLUCOSE, CAPILLARY: Glucose-Capillary: 110 mg/dL — ABNORMAL HIGH (ref 70–99)

## 2013-03-05 LAB — TROPONIN I
Troponin I: <0.3 ng/mL (ref <0.30)
Troponin I: <0.3 ng/mL (ref <0.30)

## 2013-03-05 LAB — PRO B NATRIURETIC PEPTIDE: Pro B Natriuretic peptide (BNP): 675 pg/mL — ABNORMAL HIGH (ref 0–450)

## 2013-03-05 LAB — BASIC METABOLIC PANEL
BUN: 25 mg/dL — ABNORMAL HIGH (ref 6–23)
CO2: 24 mEq/L (ref 19–32)
Calcium: 9.3 mg/dL (ref 8.4–10.5)
Chloride: 106 mEq/L (ref 96–112)
Creatinine, Ser: 1.77 mg/dL — ABNORMAL HIGH (ref 0.50–1.35)
GFR calc Af Amer: 39 mL/min — ABNORMAL LOW (ref >90)
GFR calc non Af Amer: 34 mL/min — ABNORMAL LOW (ref >90)
Glucose, Bld: 121 mg/dL — ABNORMAL HIGH (ref 70–99)
Potassium: 4.1 mEq/L (ref 3.5–5.1)
Sodium: 142 mEq/L (ref 135–145)

## 2013-03-05 LAB — HEMOGLOBIN A1C
Hgb A1c MFr Bld: 5.9 % — ABNORMAL HIGH (ref <5.7)
Mean Plasma Glucose: 123 mg/dL — ABNORMAL HIGH (ref <117)

## 2013-03-05 LAB — MAGNESIUM: Magnesium: 1.9 mg/dL (ref 1.5–2.5)

## 2013-03-05 LAB — TSH: TSH: 0.957 u[IU]/mL (ref 0.350–4.500)

## 2013-03-05 LAB — CK: Total CK: 529 U/L — ABNORMAL HIGH (ref 7–232)

## 2013-03-05 MED ORDER — CHLORHEXIDINE GLUCONATE CLOTH 2 % EX PADS
6.0000 | MEDICATED_PAD | Freq: Every day | CUTANEOUS | Status: AC
  Administered 2013-03-05 – 2013-03-09 (×5): 6 via TOPICAL

## 2013-03-05 MED ORDER — SODIUM CHLORIDE 0.9 % IV SOLN
INTRAVENOUS | Status: DC
  Administered 2013-03-05: 1000 mL via INTRAVENOUS
  Administered 2013-03-07: 05:00:00 via INTRAVENOUS

## 2013-03-05 MED ORDER — PIPERACILLIN-TAZOBACTAM 3.375 G IVPB
3.3750 g | Freq: Three times a day (TID) | INTRAVENOUS | Status: DC
  Administered 2013-03-05 – 2013-03-09 (×12): 3.375 g via INTRAVENOUS
  Filled 2013-03-05 (×16): qty 50

## 2013-03-05 MED ORDER — MUPIROCIN 2 % EX OINT
1.0000 "application " | TOPICAL_OINTMENT | Freq: Two times a day (BID) | CUTANEOUS | Status: AC
  Administered 2013-03-05 – 2013-03-09 (×10): 1 via NASAL
  Filled 2013-03-05 (×2): qty 22

## 2013-03-05 NOTE — Telephone Encounter (Signed)
OK to dc home health if patient does not want.

## 2013-03-05 NOTE — Evaluation (Signed)
Clinical/Bedside Swallow Evaluation Patient Details  Name: [name] MRN: [id number] Date of Birth: [birth year]  Today's Date: 03/05/2013 Time: 1400-1445 SLP Time Calculation (min): 45 min  Past Medical History:  Past Medical History  Diagnosis Date  . Arthritis   . Depression   . Diabetes mellitus   . Hypertension   . Hyperlipidemia   . Myocardial infarction 1984  . Colon cancer   . AAA (abdominal aortic aneurysm) 2011  . CAD (coronary artery disease) 2003  . Chronic kidney disease   . Anemia   . Diverticula of colon 2013  . Hypertrophy of prostate without urinary obstruction and other lower urinary tract symptoms (LUTS) 2008  . Hyperpotassemia 2008  . Gout, unspecified 2008  . Impacted cerumen 06/20/2006  . First degree atrioventricular block 2008  . Abnormality of gait 2007  . Other specified cardiac dysrhythmias(427.89) 2005  . Unspecified disorder of kidney and ureter 2005  . Edema 2005  . Malignant neoplasm of colon, unspecified site 2004  . Unspecified hereditary and idiopathic peripheral neuropathy 2004  . Chest pain, unspecified 2003  . Pain in joint, pelvic region and thigh 2004  . Pain in limb 2003   Past Surgical History:  Past Surgical History  Procedure Laterality Date  . Mediansternotomy    . Right colectomy  2004  . Coronary artery bypass graft  1996    Bartle, MD  . Hernia repair Right 1952    In [employer]   HPI:  79 yo male who was brought to [facility] ED after found on the floor at home unresponsive.  He reported feeling weak for a week, with subjective fevers, chills, cough and malaise. He also reported several days' duration of abdominal discomfort with nausea and poor oral intake. Family reported noticing vomiting after they found him on the floor. Dx include:  Acute hypoxic respiratory failure, right lower lobe community acquired pneumonia versus aspiration pneumonia versus postobstructive pneumonia (RN has noted apparent difficulty with thin liquids -  SLP eval ordered), possible RLL mass .     Assessment / Plan / Recommendation Clinical Impression  Pt presents with dysphagia due to changes in cognition, decreased attention to task, oropharyngeal delays with reduced airway protection noted with thin liquids.  Baseline status uncertain.   Assisted with lunch meal.  Required mod cues for clearing oral residue/pocketing of solids bilaterally and for intermittent throat clearing.  Pt needed max assist for self-feeding.  Recommend downgrading diet to dysphagia 3, nectar-thick liquids; meds whole with puree.   SLP will follow for readiness for diet advancement vs necessity of instumental testing.  Discussed with RN.Marland Kitchen    Aspiration Risk  Moderate    Diet Recommendation Dysphagia 3 (Mechanical Soft);Nectar-thick liquid   Liquid Administration via: Cup Medication Administration: Whole meds with puree Supervision: Full supervision/cueing for compensatory strategies (assist with feeding as needed) Compensations: Slow rate;Small sips/bites;Check for pocketing;Clear throat intermittently Postural Changes and/or Swallow Maneuvers: Seated upright 90 degrees    Other  Recommendations Oral Care Recommendations: Oral care BID Other Recommendations: Order thickener from pharmacy   Follow Up Recommendations   (tba)    Frequency and Duration min 2x/week  1 week   Pertinent Vitals/Pain No pain    SLP Swallow Goals Patient will utilize recommended strategies during swallow to increase swallowing safety with: Supervision/safety   Swallow Study Prior Functional Status       General Date of Onset: 03/04/13 HPI: 79 yo male who was brought to [facility] ED after found on the  floor at home unresponsive.  He reported feeling weak for a week, with subjective fevers, chills, cough and malaise. He also reported several days' duration of abdominal discomfort with nausea and poor oral intake. Family reported noticing vomiting after they found him on the floor. Dx  include:  Acute hypoxic respiratory failure, right lower lobe community acquired pneumonia versus aspiration pneumonia versus postobstructive pneumonia (RN has noted apparent difficulty with thin liquids - SLP eval ordered), possible RLL mass .   Type of Study: Bedside swallow evaluation Diet Prior to this Study: Regular;Thin liquids Temperature Spikes Noted: No Respiratory Status: Supplemental O2 delivered via (comment) (4 liters [place]) History of Recent Intubation: No Behavior/Cognition: Alert;Confused Oral Cavity - Dentition: Dentures, top;Dentures, bottom Self-Feeding Abilities: Needs assist Patient Positioning: Upright in bed Baseline Vocal Quality: Hoarse Volitional Cough: Strong Volitional Swallow: Able to elicit    Oral/Motor/Sensory Function Overall Oral Motor/Sensory Function: Appears within functional limits for tasks assessed   Ice Chips Ice chips: Not tested   Thin Liquid Thin Liquid: Impaired Presentation: Cup;Spoon Pharyngeal  Phase Impairments: Suspected delayed Swallow;Decreased hyoid-laryngeal movement;Wet Vocal Quality    Nectar Thick Nectar Thick Liquid: Impaired Presentation: Cup;Spoon Oral phase functional implications: Prolonged oral transit Pharyngeal Phase Impairments: Suspected delayed Swallow;Decreased hyoid-laryngeal movement;Multiple swallows   Honey Thick Honey Thick Liquid: Not tested   Puree Puree: Impaired Presentation: Spoon Oral Phase Functional Implications: Prolonged oral transit;Oral residue Pharyngeal Phase Impairments: Suspected delayed Swallow;Decreased hyoid-laryngeal movement   Solid   GO    Solid: Impaired Presentation: Spoon Oral Phase Functional Implications: Oral residue Pharyngeal Phase Impairments: Suspected delayed Swallow;Decreased hyoid-laryngeal movement      [name] L. [name], [place] CCC/SLP Pager [phone]  [name] [name] 03/05/2013,2:59 PM

## 2013-03-05 NOTE — Telephone Encounter (Signed)
Message printed off and given to MD

## 2013-03-05 NOTE — Progress Notes (Signed)
Utilization Review Completed.Zannie Locastro T9/24/2014  

## 2013-03-05 NOTE — Progress Notes (Signed)
[facility] Progress Note [place] TEAM 1 - Stepdown/ICU TEAM   [name] ZOX:[id number] DOB: [birth year] DOA: 03/04/2013 PCP: [name], MD  Admit HPI / Brief Narrative: 79 yo male who was brought to [facility] ED after found on the floor at home unresponsive. It is unknown how long pt had been on the floor. He reported feeling week for a week, with subjective fevers, chills, cough and malaise. He also reported several days duration of abdominal discomfort with nausea and poor oral intake. Family reported noticing vomiting after they found him on the floor. He denied similar events in the past. He denied focal neurological symptoms, no headaches and no visual changes.   In ED, pt found to be hypoxic with saturations in mid 80's on RA but quickly responded to oxygen via [place] 4 L. CXR worrisome for PNA.   Assessment/Plan:  Acute hypoxic respiratory failure sats much improved - titrate O2 as able   Right lower lobe community acquired pneumonia versus aspiration pneumonia versus postobstructive pneumonia RN has noted apparent difficulty with thin liquids - SLP evaluation when more alert - will broaden antibiotics to provide better anaerobic coverage - plan for CT of chest when renal function allows  Possible right lower lobe lung mass Will need CT of the chest when kidney function stabilizes  Syncope with fall likely due to above - CT head w/o acute findings   Mild Rhabdomyolysis Keep hydrated   Diabetes mellitus CBG well controlled  Chronic kidney disease stage I Baseline creatinine approximately 1.7 as of June 2014 - hydrate and follow trend  Abdominal aortic aneurysm diagnosed 2011  Coronary artery disease status post CABG 1996 No indication of ACS  Hyperlipidemia  History of colon cancer status post right colectomy  MRSA screen positive Continue usual contact precautions   Incidentally noted Right thyroid 2 cm lesion  For imaging as stabilized  Code Status:  FULL Family Communication: spoke w/ sister who is POA at bedside  Disposition Plan: SDU  Consultants: None  Procedures: None  Antibiotics: Maxipime 9/23 Vancomycin 9/23 >> Zosyn 9/24 >>  DVT prophylaxis: lovenox  HPI/Subjective: The patient is very sedated the time of my exam.  He is unable to answer questions/provide a reliable history.  I had an extensive discussion with the patient's sister/power of attorney at the bedside.  Objective: Blood pressure 155/67, pulse 57, temperature 98 F (36.7 C), temperature source Oral, resp. rate 19, height 6' (1.829 m), weight 74.4 kg (164 lb 0.4 oz), SpO2 100.00%.  Intake/Output Summary (Last 24 hours) at 03/05/13 1246 Last data filed at 03/05/13 1016  Gross per 24 hour  Intake     50 ml  Output    300 ml  Net   -250 ml   Exam: General: No acute respiratory distress evident at rest Lungs: Bibasilar crackles without wheeze Cardiovascular: Regular rate and rhythm without murmur gallop or rub normal S1 and S2 Abdomen: Nontender, nondistended, soft, bowel sounds positive, no rebound, no ascites, no appreciable mass Extremities: No significant cyanosis, clubbing, or edema bilateral lower extremities  Data Reviewed: Basic Metabolic Panel:  Recent Labs Lab 03/04/13 1720  NA 140  K 4.4  CL 103  CO2 25  GLUCOSE 169*  BUN 24*  CREATININE 1.87*  CALCIUM 9.6   Liver Function Tests:  Recent Labs Lab 03/04/13 1720  AST 20  ALT 10  ALKPHOS 75  BILITOT 0.4  PROT 6.7  ALBUMIN 3.5   CBC:  Recent Labs Lab 03/04/13 1720  03/05/13 0230  WBC 13.4* 8.5  NEUTROABS 12.4*  --   HGB 14.0 13.5  HCT 40.1 38.7*  MCV 90.3 90.0  PLT 160 131*   Cardiac Enzymes:  Recent Labs Lab 03/04/13 1720 03/04/13 2122 03/05/13 0230  CKTOTAL 407*  --   --   TROPONINI <0.30 <0.30 <0.30   BNP (last 3 results)  Recent Labs  03/05/13 0230  PROBNP 675.0*   CBG:  Recent Labs Lab 03/05/13 0046 03/05/13 0417 03/05/13 0834  GLUCAP  121* 110* 102*    Recent Results (from the past 240 hour(s))  MRSA PCR SCREENING     Status: Abnormal   Collection Time    03/05/13 12:48 AM      Result Value Range Status   MRSA by PCR POSITIVE (*) NEGATIVE Final   Comment:            The GeneXpert MRSA Assay (FDA     approved for NASAL specimens     only), is one component of a     comprehensive MRSA colonization     surveillance program. It is not     intended to diagnose MRSA     infection nor to guide or     monitor treatment for     MRSA infections.     RESULT CALLED TO, READ BACK BY AND VERIFIED WITH:     MHICKS,RN [phone] [facility]     Studies:  Recent x-ray studies have been reviewed in detail by the Attending Physician  Scheduled Meds:  Scheduled Meds: . aspirin  81 mg Oral Daily  . ceFEPime (MAXIPIME) IV  1 g Intravenous Q24H  . Chlorhexidine Gluconate Cloth  6 each Topical Q0600  . donepezil  10 mg Oral QHS  . enoxaparin (LOVENOX) injection  30 mg Subcutaneous Q24H  . insulin aspart  0-9 Units Subcutaneous TID WC  . mirtazapine  7.5 mg Oral QHS  . mupirocin ointment  1 application Nasal BID  . vancomycin  1,000 mg Intravenous Q24H    Time spent on care of this patient: 35 mins   [facility]  [facility] Office  [phone] Pager - Text Page per Loretha Stapler as per below:  On-Call/Text Page:      [email or web address]      password [email or web address]  If 7PM-7AM, please contact night-coverage [email or web address] Password [facility] 03/05/2013, 12:46 PM   LOS: 1 day

## 2013-03-06 ENCOUNTER — Inpatient Hospital Stay (HOSPITAL_COMMUNITY): Payer: Medicare Other

## 2013-03-06 DIAGNOSIS — E119 Type 2 diabetes mellitus without complications: Secondary | ICD-10-CM

## 2013-03-06 DIAGNOSIS — I10 Essential (primary) hypertension: Secondary | ICD-10-CM

## 2013-03-06 DIAGNOSIS — J189 Pneumonia, unspecified organism: Secondary | ICD-10-CM

## 2013-03-06 LAB — LIPID PANEL
Cholesterol: 116 mg/dL (ref 0–200)
Total CHOL/HDL Ratio: 3.1 RATIO
Triglycerides: 109 mg/dL (ref <150)
VLDL: 22 mg/dL (ref 0–40)

## 2013-03-06 LAB — AMMONIA: Ammonia: 18 umol/L (ref 11–60)

## 2013-03-06 LAB — COMPREHENSIVE METABOLIC PANEL
AST: 19 U/L (ref 0–37)
Albumin: 2.8 g/dL — ABNORMAL LOW (ref 3.5–5.2)
Alkaline Phosphatase: 57 U/L (ref 39–117)
Calcium: 8.8 mg/dL (ref 8.4–10.5)
Creatinine, Ser: 1.8 mg/dL — ABNORMAL HIGH (ref 0.50–1.35)

## 2013-03-06 LAB — GLUCOSE, CAPILLARY
Glucose-Capillary: 76 mg/dL (ref 70–99)
Glucose-Capillary: 88 mg/dL (ref 70–99)
Glucose-Capillary: 82 mg/dL (ref 70–99)
Glucose-Capillary: 141 mg/dL — ABNORMAL HIGH (ref 70–99)

## 2013-03-06 LAB — CBC
HCT: 36.3 % — ABNORMAL LOW (ref 39.0–52.0)
Hemoglobin: 12.6 g/dL — ABNORMAL LOW (ref 13.0–17.0)
MCH: 31.4 pg (ref 26.0–34.0)
MCV: 90.5 fL (ref 78.0–100.0)
RBC: 4.01 MIL/uL — ABNORMAL LOW (ref 4.22–5.81)

## 2013-03-06 LAB — LEGIONELLA ANTIGEN, URINE: Legionella Antigen, Urine: NEGATIVE

## 2013-03-06 MED ORDER — LISINOPRIL 20 MG PO TABS
20.0000 mg | ORAL_TABLET | Freq: Every day | ORAL | Status: DC
  Administered 2013-03-06 – 2013-03-10 (×5): 20 mg via ORAL
  Filled 2013-03-06 (×6): qty 1

## 2013-03-06 MED ORDER — SIMVASTATIN 40 MG PO TABS
40.0000 mg | ORAL_TABLET | Freq: Every day | ORAL | Status: DC
  Administered 2013-03-06 – 2013-03-09 (×4): 40 mg via ORAL
  Filled 2013-03-06 (×5): qty 1

## 2013-03-06 MED ORDER — ENOXAPARIN SODIUM 40 MG/0.4ML ~~LOC~~ SOLN
40.0000 mg | SUBCUTANEOUS | Status: DC
  Administered 2013-03-06: 23:00:00 40 mg via SUBCUTANEOUS
  Filled 2013-03-06 (×3): qty 0.4

## 2013-03-06 MED ORDER — MIRTAZAPINE 7.5 MG PO TABS
7.5000 mg | ORAL_TABLET | Freq: Every day | ORAL | Status: DC
  Administered 2013-03-06 – 2013-03-09 (×4): 7.5 mg via ORAL
  Filled 2013-03-06 (×5): qty 1

## 2013-03-06 MED ORDER — INFLUENZA VAC SPLIT QUAD 0.5 ML IM SUSP
0.5000 mL | INTRAMUSCULAR | Status: AC
  Administered 2013-03-07: 0.5 mL via INTRAMUSCULAR
  Filled 2013-03-06: qty 0.5

## 2013-03-06 NOTE — Progress Notes (Signed)
SLP Cancellation Note  Patient Details Name: [name] MRN: [id number] DOB: [birth year]   Cancelled treatment:       Reason Eval/Treat Not Completed: Patient at procedure or test/unavailable (Will f/u 9/26. )  Ferdinand Lango MA, CCC-SLP [phone]  [name] Meryl 03/06/2013, 2:57 PM

## 2013-03-06 NOTE — Progress Notes (Addendum)
[facility] Progress Note [place] TEAM 1 - Stepdown/ICU TEAM   CONWAY FEDORA ZOX:[id number] DOB: [birth year] DOA: 03/04/2013 PCP: [name], MD  Admit HPI / Brief Narrative: 79 yo male who was brought to [facility] ED after found on the floor at home unresponsive. It is unknown how long pt had been on the floor. He reported feeling week for a week, with subjective fevers, chills, cough and malaise. He also reported several days duration of abdominal discomfort with nausea and poor oral intake. Family reported noticing vomiting after they found him on the floor. He denied similar events in the past. He denied focal neurological symptoms, no headaches and no visual changes.   In ED, pt found to be hypoxic with saturations in mid 80's on RA but quickly responded to oxygen via [place] 4 L. CXR worrisome for PNA.   Assessment/Plan:  Acute hypoxic respiratory failure sats much improved - O2 down to 3 L with pulse ox of 97%- was on 6 L this morning  Right lower lobe community acquired pneumonia versus aspiration pneumonia versus postobstructive pneumonia RN has noted apparent difficulty with thin liquids- SLP recommends nectar thick liquids - on Vanc and Zosyn- gram neg rods in sputum culture consistent with aspiration  Possible right lower lobe lung mass Will order CT of the chest without contrast due to renal function  Syncope with fall From hypoxia? - CT head w/o acute findings   Abdominal aortic aneurysm diagnosed 2011 - quite large at 5.5 cm  Mild Rhabdomyolysis Keep hydrated - CK improved from 500s to 280s  Diabetes mellitus CBG well controlled  Chronic kidney disease stage I Baseline creatinine approximately 1.7 as of June 2014 -  Coronary artery disease status post CABG 1996 No indication of ACS  Hyperlipidemia - lipid profile reveals only a low HDL at 37  History of colon cancer status post right colectomy  MRSA screen positive Continue usual contact precautions    Incidentally noted Right thyroid 2 cm lesion  For imaging as stabilized  Code Status: FULL Family Communication: none Disposition Plan: tx out of SDU today- Pt eval  Consultants: None  Procedures: None  Antibiotics: Maxipime 9/23 Vancomycin 9/23 >> Zosyn 9/24 >>  DVT prophylaxis: lovenox  HPI/Subjective: The patient is alert- ate "most of his breakfast" - RN at bedside states he had 75% and has drank about 500 cc of fluid today. States he has not had a BM in 2 days.   Objective: Blood pressure 141/59, pulse 78, temperature 99.2 F (37.3 C), temperature source Oral, resp. rate 14, height 6' (1.829 m), weight 74.4 kg (164 lb 0.4 oz), SpO2 96.00%.  Intake/Output Summary (Last 24 hours) at 03/06/13 1224 Last data filed at 03/06/13 8119  Gross per 24 hour  Intake    350 ml  Output    100 ml  Net    250 ml   Exam: General: No acute respiratory distress evident at rest Lungs: severely decreased breath sounds- pt not taking a deep breath despite being asked to  Cardiovascular: Regular rate and rhythm without murmur gallop or rub normal S1 and S2 Abdomen: Nontender, nondistended, soft, bowel sounds positive, no rebound, no ascites, no appreciable mass Extremities: No significant cyanosis, clubbing, or edema bilateral lower extremities  Data Reviewed: Basic Metabolic Panel:  Recent Labs Lab 03/04/13 1720 03/05/13 1050 03/06/13 0450  NA 140 142 140  K 4.4 4.1 3.9  CL 103 106 107  CO2 25 24 22   GLUCOSE 169* 121*  81  BUN 24* 25* 26*  CREATININE 1.87* 1.77* 1.80*  CALCIUM 9.6 9.3 8.8  MG  --  1.9  --    Liver Function Tests:  Recent Labs Lab 03/04/13 1720 03/06/13 0450  AST 20 19  ALT 10 9  ALKPHOS 75 57  BILITOT 0.4 0.3  PROT 6.7 5.8*  ALBUMIN 3.5 2.8*   CBC:  Recent Labs Lab 03/04/13 1720 03/05/13 0230 03/06/13 0450  WBC 13.4* 8.5 6.5  NEUTROABS 12.4*  --   --   HGB 14.0 13.5 12.6*  HCT 40.1 38.7* 36.3*  MCV 90.3 90.0 90.5  PLT 160 131*  132*   Cardiac Enzymes:  Recent Labs Lab 03/04/13 1720 03/04/13 2122 03/05/13 0230 03/05/13 1050 03/06/13 0450  CKTOTAL 407*  --   --  529* 281*  TROPONINI <0.30 <0.30 <0.30 <0.30  --    BNP (last 3 results)  Recent Labs  03/05/13 0230  PROBNP 675.0*   CBG:  Recent Labs Lab 03/05/13 1545 03/05/13 2059 03/06/13 0113 03/06/13 0443 03/06/13 0827  GLUCAP 107* 76 88 88 82    Recent Results (from the past 240 hour(s))  CULTURE, RESPIRATORY (NON-EXPECTORATED)     Status: None   Collection Time    03/05/13 12:46 AM      Result Value Range Status   Specimen Description TRACHEAL ASPIRATE   Final   Special Requests NONE   Final   Gram Stain     Final   Value: FEW WBC PRESENT,BOTH PMN AND MONONUCLEAR     RARE SQUAMOUS EPITHELIAL CELLS PRESENT     FEW GRAM NEGATIVE RODS     FEW GRAM NEGATIVE COCCI     RARE GRAM POSITIVE COCCI   Culture     Final   Value: Culture reincubated for better growth     Performed at [facility]   Report Status PENDING   Incomplete  MRSA PCR SCREENING     Status: Abnormal   Collection Time    03/05/13 12:48 AM      Result Value Range Status   MRSA by PCR POSITIVE (*) NEGATIVE Final   Comment:            The GeneXpert MRSA Assay (FDA     approved for NASAL specimens     only), is one component of a     comprehensive MRSA colonization     surveillance program. It is not     intended to diagnose MRSA     infection nor to guide or     monitor treatment for     MRSA infections.     RESULT CALLED TO, READ BACK BY AND VERIFIED WITH:     MHICKS,RN [phone] [facility]  CULTURE, BLOOD (ROUTINE X 2)     Status: None   Collection Time    03/05/13  2:18 AM      Result Value Range Status   Specimen Description BLOOD RIGHT ARM   Final   Special Requests BOTTLES DRAWN AEROBIC ONLY 3CC   Final   Culture  Setup Time     Final   Value: 03/05/2013 10:02     Performed at [facility]   Culture     Final   Value:        BLOOD CULTURE  RECEIVED NO GROWTH TO DATE CULTURE WILL BE HELD FOR 5 DAYS BEFORE ISSUING A FINAL NEGATIVE REPORT     Performed at [facility]   Report Status  PENDING   Incomplete  CULTURE, BLOOD (ROUTINE X 2)     Status: None   Collection Time    03/05/13  2:30 AM      Result Value Range Status   Specimen Description BLOOD LEFT HAND   Final   Special Requests BOTTLES DRAWN AEROBIC ONLY 3CC   Final   Culture  Setup Time     Final   Value: 03/05/2013 10:02     Performed at [facility]   Culture     Final   Value:        BLOOD CULTURE RECEIVED NO GROWTH TO DATE CULTURE WILL BE HELD FOR 5 DAYS BEFORE ISSUING A FINAL NEGATIVE REPORT     Performed at [facility]   Report Status PENDING   Incomplete     Studies:  Recent x-ray studies have been reviewed in detail by the Attending Physician  Scheduled Meds:  Scheduled Meds: . aspirin  81 mg Oral Daily  . Chlorhexidine Gluconate Cloth  6 each Topical Q0600  . donepezil  10 mg Oral QHS  . enoxaparin (LOVENOX) injection  40 mg Subcutaneous Q24H  . insulin aspart  0-9 Units Subcutaneous TID WC  . mupirocin ointment  1 application Nasal BID  . piperacillin-tazobactam (ZOSYN)  IV  3.375 g Intravenous Q8H  . vancomycin  1,000 mg Intravenous Q24H    Time spent on care of this patient: 35 mins   Calvert Cantor, MD  [facility] Office  [phone] Pager - Text Page per Loretha Stapler as per below:  On-Call/Text Page:      [email or web address]      password [email or web address]  If 7PM-7AM, please contact night-coverage [email or web address] Password [email or web address] 03/06/2013, 12:24 PM   LOS: 2 days

## 2013-03-06 NOTE — ED Provider Notes (Signed)
I performed a history and physical examination of  [name] and discussed his management with Dr. Modesto Charon. I agree with the history, physical, assessment, and plan of care, with the following exceptions: None I was present for the following procedures: None  Time Spent in Critical Care of the patient: None  Time spent in discussions with the patient and family: 10 minutes  [name]  PMHX of AAA, CAD, CKD, HTN,DM, HLD comes in with what appears to be syncope. Lives alone, and was on the floor for a long duration. Appropriate labs and imaging ordered. Syncope could be due to AAA, will have the admitting team consult Vascular.  I was present for the bedside US imaging of the patient. Images were archived. AAA appreciated. Pt had a pulsatile mass, non tender.     [name], MD 03/06/13 [id number]

## 2013-03-06 NOTE — Progress Notes (Signed)
[name] is a 79 y.o. male patient who transferred  from 2 c stepdown awake, alert  & orientated  X 3, Full Code, VSS - Blood pressure 144/57, pulse 53, temperature 97.7 F (36.5 C), temperature source Oral, resp. rate 18, height 5\' 11"  (1.803 m), weight 78.3 kg (172 lb 9.9 oz), SpO2 99.00%., O2   @ 2 L nasal cannular, no c/o shortness of breath, no c/o chest pain, no distress noted. Tele # tx 09 placed and pt is currently running:sinus bradycardia/ 1st degree HB.   IV site WDL: wrist right, L forearm condition patent and no redness with a transparent dsg that's clean dry and intact.  Allergies:   Allergies  Allergen Reactions  . Diovan [Valsartan]      Past Medical History  Diagnosis Date  . Arthritis   . Depression   . Diabetes mellitus   . Hypertension   . Hyperlipidemia   . Myocardial infarction 1984  . Colon cancer   . AAA (abdominal aortic aneurysm) 2011  . CAD (coronary artery disease) 2003  . Chronic kidney disease   . Anemia   . Diverticula of colon 2013  . Hypertrophy of prostate without urinary obstruction and other lower urinary tract symptoms (LUTS) 2008  . Hyperpotassemia 2008  . Gout, unspecified 2008  . Impacted cerumen 06/20/2006  . First degree atrioventricular block 2008  . Abnormality of gait 2007  . Other specified cardiac dysrhythmias(427.89) 2005  . Unspecified disorder of kidney and ureter 2005  . Edema 2005  . Malignant neoplasm of colon, unspecified site 2004  . Unspecified hereditary and idiopathic peripheral neuropathy 2004  . Chest pain, unspecified 2003  . Pain in joint, pelvic region and thigh 2004  . Pain in limb 2003    Pt orientation to unit, room and routine. SR up x 2, fall risk assessment complete with Patient and family verbalizing understanding of risks associated with falls. Pt verbalizes an understanding of how to use the call bell and to call for help before getting out of bed.  Skin, clean-dry- intact without evidence of  bruising, or skin tears.   Foam on buttocks     Will cont to monitor and assist as needed.  Cindra Eves, RN 03/06/2013 8:04 PM

## 2013-03-07 ENCOUNTER — Other Ambulatory Visit: Payer: Self-pay | Admitting: *Deleted

## 2013-03-07 DIAGNOSIS — I714 Abdominal aortic aneurysm, without rupture: Secondary | ICD-10-CM

## 2013-03-07 LAB — CULTURE, RESPIRATORY W GRAM STAIN

## 2013-03-07 LAB — GLUCOSE, CAPILLARY: Glucose-Capillary: 99 mg/dL (ref 70–99)

## 2013-03-07 MED ORDER — PNEUMOCOCCAL VAC POLYVALENT 25 MCG/0.5ML IJ INJ
0.5000 mL | INJECTION | INTRAMUSCULAR | Status: AC
  Administered 2013-03-08: 0.5 mL via INTRAMUSCULAR
  Filled 2013-03-07: qty 0.5

## 2013-03-07 MED ORDER — ENOXAPARIN SODIUM 30 MG/0.3ML ~~LOC~~ SOLN
30.0000 mg | SUBCUTANEOUS | Status: DC
  Administered 2013-03-07 – 2013-03-09 (×3): 30 mg via SUBCUTANEOUS
  Filled 2013-03-07 (×4): qty 0.3

## 2013-03-07 MED ORDER — RESOURCE THICKENUP CLEAR PO POWD
ORAL | Status: DC | PRN
  Filled 2013-03-07: qty 125

## 2013-03-07 NOTE — Progress Notes (Signed)
[facility] Progress Note [place] TEAM 1 - Stepdown/ICU TEAM   [name] YNW:[id number] DOB: 06-05-1928 DOA: 03/04/2013 PCP: Douglas Relic, MD  Admit HPI / Brief Narrative: 79 yo male who was brought to [facility] ED after found on the floor at home unresponsive. It is unknown how long pt had been on the floor. He reported feeling week for a week, with subjective fevers, chills, cough and malaise. He also reported several days duration of abdominal discomfort with nausea and poor oral intake. Family reported noticing vomiting after they found him on the floor. He denied similar events in the past. He denied focal neurological symptoms, no headaches and no visual changes.   In ED, pt found to be hypoxic with saturations in mid 80's on RA but quickly responded to oxygen via [place] 4 L. CXR worrisome for PNA.   Assessment/Plan:   Acute hypoxic respiratory failure secondary to right-sided CAP  Improving with supportive care which includes empiric IV antibiotics, oxygen as needed along with nebulizer treatments. CT scan shows a large infiltrate, mass cannot be ruled out. Post discharge will need a repeat 2 view chest x-ray in 2-3 weeks time frame.    Syncope with fall as in #1 above From hypoxia? - CT head w/o acute findings     Abdominal aortic aneurysm diagnosed 2011 - quite large at 5.5 cm, discussed with his vascular surgeon Dr. Myra Huber who will see him in the office in 2-3 weeks post discharge no acute intervention needed. Patient does not have any abdominal pain at this time    Mild Rhabdomyolysis Keep hydrated - CK improved from 500s to 280s   Diabetes mellitus CBG well controlled  Lab Results  Component Value Date   HGBA1C 5.9* 03/05/2013    CBG (last 3)   Recent Labs  03/06/13 1841 03/06/13 2209 03/07/13 0808  GLUCAP 134* 70 101*      Chronic kidney disease stage I Baseline creatinine approximately 1.7 as of June 2014 -   Coronary artery disease  status post CABG 1996 No indication of ACS   Hyperlipidemia - lipid profile reveals only a low HDL at 37   Incidentally noted Right thyroid 2 cm lesion  Outpatient followup with endocrine.    Code Status: DO NOT RESUSCITATE  Family Communication: Discussed with primary decision maker patient's sister, agrees with placement if needed, agrees with gentle medical treatment, poor baseline quality of life if declines Comfort Care  Disposition Plan: Will need SNF   Consultants: None   Procedures: None    Antibiotics: Maxipime 9/23 Vancomycin 9/23 >> Zosyn 9/24 >>    DVT prophylaxis: lovenox    HPI/Subjective:  Patient in bed, is mildly confused, denies any headache chest or abdominal pain he is not short of breath.    Objective: Blood pressure 168/72, pulse 51, temperature 98.6 F (37 C), temperature source Oral, resp. rate 16, height 5\' 11"  (1.803 m), weight 78.3 kg (172 lb 9.9 oz), SpO2 95.00%.  Intake/Output Summary (Last 24 hours) at 03/07/13 1058 Last data filed at 03/07/13 0651  Gross per 24 hour  Intake   1820 ml  Output    925 ml  Net    895 ml     Exam: General: No acute respiratory distress evident at rest Lungs: severely decreased breath sounds- pt not taking a deep breath despite being asked to  Cardiovascular: Regular rate and rhythm without murmur gallop or rub normal S1 and S2 Abdomen: Nontender, nondistended, soft, bowel sounds  positive, no rebound, no ascites, no appreciable mass Extremities: No significant cyanosis, clubbing, or edema bilateral lower extremities CNS : He is awake, however he is delirious and confused, no focal deficits,   Data Reviewed: Basic Metabolic Panel:  Recent Labs Lab 03/04/13 1720 03/05/13 1050 03/06/13 0450  NA 140 142 140  K 4.4 4.1 3.9  CL 103 106 107  CO2 25 24 22   GLUCOSE 169* 121* 81  BUN 24* 25* 26*  CREATININE 1.87* 1.77* 1.80*  CALCIUM 9.6 9.3 8.8  MG  --  1.9  --    Liver Function  Tests:  Recent Labs Lab 03/04/13 1720 03/06/13 0450  AST 20 19  ALT 10 9  ALKPHOS 75 57  BILITOT 0.4 0.3  PROT 6.7 5.8*  ALBUMIN 3.5 2.8*   CBC:  Recent Labs Lab 03/04/13 1720 03/05/13 0230 03/06/13 0450  WBC 13.4* 8.5 6.5  NEUTROABS 12.4*  --   --   HGB 14.0 13.5 12.6*  HCT 40.1 38.7* 36.3*  MCV 90.3 90.0 90.5  PLT 160 131* 132*   Cardiac Enzymes:  Recent Labs Lab 03/04/13 1720 03/04/13 2122 03/05/13 0230 03/05/13 1050 03/06/13 0450  CKTOTAL 407*  --   --  529* 281*  TROPONINI <0.30 <0.30 <0.30 <0.30  --    BNP (last 3 results)  Recent Labs  03/05/13 0230  PROBNP 675.0*   CBG:  Recent Labs Lab 03/06/13 1149 03/06/13 1636 03/06/13 1841 03/06/13 2209 03/07/13 0808  GLUCAP 119* 141* 134* 70 101*    Recent Results (from the past 240 hour(s))  CULTURE, RESPIRATORY (NON-EXPECTORATED)     Status: None   Collection Time    03/05/13 12:46 AM      Result Value Range Status   Specimen Description TRACHEAL ASPIRATE   Final   Special Requests NONE   Final   Gram Stain     Final   Value: FEW WBC PRESENT,BOTH PMN AND MONONUCLEAR     RARE SQUAMOUS EPITHELIAL CELLS PRESENT     FEW GRAM NEGATIVE RODS     FEW GRAM NEGATIVE COCCI     RARE GRAM POSITIVE COCCI   Culture     Final   Value: Non-Pathogenic Oropharyngeal-type Flora Isolated.     Performed at [facility]   Report Status 03/07/2013 FINAL   Final  MRSA PCR SCREENING     Status: Abnormal   Collection Time    03/05/13 12:48 AM      Result Value Range Status   MRSA by PCR POSITIVE (*) NEGATIVE Final   Comment:            The GeneXpert MRSA Assay (FDA     approved for NASAL specimens     only), is one component of a     comprehensive MRSA colonization     surveillance program. It is not     intended to diagnose MRSA     infection nor to guide or     monitor treatment for     MRSA infections.     RESULT CALLED TO, READ BACK BY AND VERIFIED WITH:     MHICKS,RN [phone] [facility]   CULTURE, BLOOD (ROUTINE X 2)     Status: None   Collection Time    03/05/13  2:18 AM      Result Value Range Status   Specimen Description BLOOD RIGHT ARM   Final   Special Requests BOTTLES DRAWN AEROBIC ONLY 3CC   Final   Culture  Setup Time     Final   Value: 03/05/2013 10:02     Performed at [facility]   Culture     Final   Value:        BLOOD CULTURE RECEIVED NO GROWTH TO DATE CULTURE WILL BE HELD FOR 5 DAYS BEFORE ISSUING A FINAL NEGATIVE REPORT     Performed at [facility]   Report Status PENDING   Incomplete  CULTURE, BLOOD (ROUTINE X 2)     Status: None   Collection Time    03/05/13  2:30 AM      Result Value Range Status   Specimen Description BLOOD LEFT HAND   Final   Special Requests BOTTLES DRAWN AEROBIC ONLY 3CC   Final   Culture  Setup Time     Final   Value: 03/05/2013 10:02     Performed at [facility]   Culture     Final   Value:        BLOOD CULTURE RECEIVED NO GROWTH TO DATE CULTURE WILL BE HELD FOR 5 DAYS BEFORE ISSUING A FINAL NEGATIVE REPORT     Performed at [facility]   Report Status PENDING   Incomplete     Studies:  Recent x-ray studies have been reviewed in detail by the Attending Physician  Scheduled Meds:  Scheduled Meds: . aspirin  81 mg Oral Daily  . Chlorhexidine Gluconate Cloth  6 each Topical Q0600  . donepezil  10 mg Oral QHS  . enoxaparin (LOVENOX) injection  40 mg Subcutaneous Q24H  . insulin aspart  0-9 Units Subcutaneous TID WC  . lisinopril  20 mg Oral Daily  . mirtazapine  7.5 mg Oral QHS  . mupirocin ointment  1 application Nasal BID  . piperacillin-tazobactam (ZOSYN)  IV  3.375 g Intravenous Q8H  . simvastatin  40 mg Oral QHS  . vancomycin  1,000 mg Intravenous Q24H    Time spent on care of this patient: 35 mins   Leroy Sea, MD  [facility] Office  [phone] Pager - Text Page per Loretha Stapler as per below:  On-Call/Text Page:      [email or web address]      password [email or web address]  If  7PM-7AM, please contact night-coverage [email or web address] Password [email or web address] 03/07/2013, 10:58 AM   LOS: 3 days

## 2013-03-07 NOTE — Progress Notes (Signed)
Speech Language Pathology Dysphagia Treatment Patient Details Name: [name] MRN: [id number] DOB: 1927/07/17 Today's Date: 03/07/2013 Time: 1300-1330 SLP Time Calculation (min): 30 min  Assessment / Plan / Recommendation Clinical Impression  F/u for swallowing: pt presents with improvements since initial assessment two days ago.  Demonstrates better attention to POs and initiation for self-feeding; alert and asking questions.  Sister present.  Self fed Dysphagia 3 meal, tolerated solids and nectar-liquids well with no symptoms of airway compromise.  Trials of thin liquids elicited immediate coughing which persisted through rest of session, suggesting potential aspiration.  Recommend maintaining current diet through weekend. SLP to follow-up Monday, 9/29 for toleration/improvements.    Diet Recommendation  Continue with Current Diet: Dysphagia 3 (mechanical soft);Nectar-thick liquid    SLP Plan Continue with current plan of care   Pertinent Vitals/Pain No pain   Swallowing Goals  SLP Swallowing Goals Patient will utilize recommended strategies during swallow to increase swallowing safety with: Supervision/safety Swallow Study Goal #2 - Progress: Progressing toward goal  General Temperature Spikes Noted: No Respiratory Status: Supplemental O2 delivered via (comment) Behavior/Cognition: Alert;Confused Oral Cavity - Dentition: Dentures, top;Dentures, bottom Patient Positioning: Upright in bed  Oral Cavity - Oral Hygiene     Dysphagia Treatment Treatment focused on: Skilled observation of diet tolerance;Upgraded PO texture trials Treatment Methods/Modalities: Skilled observation;Differential diagnosis Patient observed directly with PO's: Yes Type of PO's observed: Dysphagia 1 (puree);Thin liquids;Nectar-thick liquids Feeding: Able to feed self Liquids provided via: Cup Pharyngeal Phase Signs & Symptoms: Multiple swallows;Immediate cough Type of cueing: Verbal Amount of  cueing: Minimal   GO   [name] L. [name], [place] CCC/SLP Pager [phone]   [name] [name] 03/07/2013, 1:37 PM

## 2013-03-07 NOTE — Evaluation (Signed)
Physical Therapy Evaluation Patient Details Name: [name] MRN: [id number] DOB: Jan 24, 1928 Today's Date: 03/07/2013 Time: 4098-1191 PT Time Calculation (min): 25 min  PT Assessment / Plan / Recommendation History of Present Illness  Pt is 79 yo male who was brought to [facility] ED after found on the floor at home and unresponsive. It is unknown how long pt has been on the floor and pt is not able to provide details. He reports feeling week for the past week, has had subjective fevers, chills, cough and malaise. He also reports several days duration of abdominal discomfort with nausea and poor oral intake. Family reports noticing vomiting after they found him on the floor. He denies similar events in the past, denies chest pain, no specific abdominal concerns at this time and no urinary concerns. He denies focal neurological symptoms, no headaches and no visual changes.   Clinical Impression  Pt with generalized weakness and decreased activity tolerance, balance and mobility.  Pt will benefit from skilled acute care PT to address deficits and increase functional independence.  Pt will require 24 hour supervision at home and may benefit from SNF on d/c.    PT Assessment  Patient needs continued PT services    Follow Up Recommendations  Supervision/Assistance - 24 hour;Home health PT;SNF    Does the patient have the potential to tolerate intense rehabilitation      Barriers to Discharge Decreased caregiver support;Inaccessible home environment      Equipment Recommendations       Recommendations for Other Services OT consult   Frequency Min 3X/week    Precautions / Restrictions Restrictions Weight Bearing Restrictions: No   Pertinent Vitals/Pain No c/o pain      Mobility  Bed Mobility Bed Mobility: Supine to Sit;Sit to Supine;Scooting to HOB Supine to Sit: 4: Min assist;HOB elevated Sit to Supine: 4: Min assist;HOB flat Scooting to HOB: 1: +1 Total assist Details for Bed  Mobility Assistance: Pt requires increased time and physical assistance due to generalized weakness Transfers Transfers: Sit to Stand Sit to Stand: 4: Min assist;From bed Details for Transfer Assistance: lifting assist, cues for LE and UE placement Ambulation/Gait Ambulation/Gait Assistance: 3: Mod assist Ambulation Distance (Feet): 2 Feet Assistive device: 1 person hand held assist Ambulation/Gait Assistance Details: Pt attempted fwd and side stepping.  Pt with strong posterior lean, narrow BOS, fwd trunk flexion, only able to take 5 small steps before fatigue.  May benefit from RW Stairs: No Wheelchair Mobility Wheelchair Mobility: No    Exercises     PT Diagnosis: Difficulty walking;Generalized weakness  PT Problem List: Decreased mobility;Decreased strength;Decreased activity tolerance;Decreased balance;Cardiopulmonary status limiting activity PT Treatment Interventions: Gait training;DME instruction;Therapeutic exercise;Wheelchair mobility training;Balance training;Neuromuscular re-education;Modalities;Stair training;Functional mobility training;Therapeutic activities;Patient/family education     PT Goals(Current goals can be found in the care plan section) Acute Rehab PT Goals Patient Stated Goal: none stated PT Goal Formulation: With patient Time For Goal Achievement: 03/14/13 Potential to Achieve Goals: Good  Visit Information  Last PT Received On: 03/07/13 Assistance Needed: +1 History of Present Illness: Pt is 79 yo male who was brought to [facility] ED after found on the floor at home and unresponsive. It is unknown how long pt has been on the floor and pt is not able to provide details. He reports feeling week for the past week, has had subjective fevers, chills, cough and malaise. He also reports several days duration of abdominal discomfort with nausea and poor oral intake. Family reports noticing vomiting after  they found him on the floor. He denies similar events in the past,  denies chest pain, no specific abdominal concerns at this time and no urinary concerns. He denies focal neurological symptoms, no headaches and no visual changes.        Prior Functioning  Home Living Family/patient expects to be discharged to:: Private residence Living Arrangements: Alone Type of Home: House Home Access: Stairs to enter [employer] of Steps: 2-3 Home Layout: One level Home Equipment: Walker - 2 wheels;Cane - single point Additional Comments: Pt reports he used cane PTA. Prior Function Level of Independence: Independent with assistive device(s)    Cognition  Cognition Arousal/Alertness: Lethargic Behavior During Therapy: WFL for tasks assessed/performed    Extremity/Trunk Assessment Lower Extremity Assessment Lower Extremity Assessment: Generalized weakness Cervical / Trunk Assessment Cervical / Trunk Assessment: Kyphotic   Balance Balance Balance Assessed: Yes Dynamic Sitting Balance Dynamic Sitting - Level of Assistance: 3: Mod assist Dynamic Sitting - Comments: for reaching tasks when fatigued Static Standing Balance Static Standing - Level of Assistance: 4: Min assist Static Standing - Comment/# of Minutes: 2 minutes before fatigued, spO2 93%  End of Session PT - End of Session Equipment Utilized During Treatment: Gait belt Activity Tolerance: Patient limited by fatigue Patient left: in bed;with call bell/phone within reach Nurse Communication: Mobility status  GP     Challis Crill 03/07/2013, 9:48 AM

## 2013-03-08 ENCOUNTER — Other Ambulatory Visit: Payer: Self-pay | Admitting: Internal Medicine

## 2013-03-08 LAB — GLUCOSE, CAPILLARY
Glucose-Capillary: 92 mg/dL (ref 70–99)
Glucose-Capillary: 113 mg/dL — ABNORMAL HIGH (ref 70–99)
Glucose-Capillary: 109 mg/dL — ABNORMAL HIGH (ref 70–99)
Glucose-Capillary: 115 mg/dL — ABNORMAL HIGH (ref 70–99)

## 2013-03-08 LAB — TSH: TSH: 1.707 u[IU]/mL (ref 0.350–4.500)

## 2013-03-08 NOTE — Progress Notes (Signed)
[facility]  Progress Note     [name]:[id number] DOB: Feb 02, 1928 DOA: 03/04/2013 PCP: [name], MD  Admit HPI / Brief Narrative:  79 yo male who was brought to [facility] ED after found on the floor at home unresponsive. It is unknown how long pt had been on the floor. He reported feeling week for a week, with subjective fevers, chills, cough and malaise. He also reported several days duration of abdominal discomfort with nausea and poor oral intake. Family reported noticing vomiting after they found him on the floor. He denied similar events in the past. He denied focal neurological symptoms, no headaches and no visual changes.   In ED, pt found to be hypoxic with saturations in mid 80's on RA but quickly responded to oxygen via [place] 4 L. CXR worrisome for PNA.   Assessment/Plan:   Acute hypoxic respiratory failure secondary to right-sided CAP  Improving with supportive care which includes empiric IV antibiotics, oxygen as needed along with nebulizer treatments. CT scan shows a large infiltrate, mass cannot be ruled out. Post discharge will need a repeat 2 view chest x-ray in 2-3 weeks time frame.    Syncope with fall as in #1 above From hypoxia? - CT head w/o acute findings     Abdominal aortic aneurysm diagnosed 2011 - quite large at 5.5 cm, discussed with his vascular surgeon Dr. Myra Gianotti who will see him in the office in 2-3 weeks post discharge no acute intervention needed. Patient does not have any abdominal pain at this time    Mild Rhabdomyolysis Keep hydrated - CK improved from 500s to 280s   Diabetes mellitus CBG well controlled  Lab Results  Component Value Date   HGBA1C 5.9* 03/05/2013    CBG (last 3)   Recent Labs  03/07/13 1735 03/07/13 2102 03/08/13 0751  GLUCAP 99 179* 92      Chronic kidney disease stage I Baseline creatinine approximately 1.7 as of June 2014 -    Coronary artery disease status post CABG 1996 No indication  of ACS    Hyperlipidemia - lipid profile reveals only a low HDL at 37    Mild bradycardia.  He remains asymptomatic, blood pressures are stable, no offending medications we will monitor closely. I will check a TSH.    Incidentally noted Right thyroid 2 cm lesion  Outpatient followup with endocrine.    Code Status: DO NOT RESUSCITATE  Family Communication: Discussed with primary decision maker patient's sister, agrees with placement if needed, agrees with gentle medical treatment, poor baseline quality of life if declines Comfort Care  Disposition Plan: Will need SNF   Consultants: None   Procedures: None    Antibiotics: Maxipime 9/23 Vancomycin 9/23 >> Zosyn 9/24 >>    DVT prophylaxis: lovenox    HPI/Subjective:  Patient in bed, is mildly confused, denies any headache chest or abdominal pain he is not short of breath.    Objective: Blood pressure 161/56, pulse 46, temperature 98.1 F (36.7 C), temperature source Oral, resp. rate 16, height 5\' 11"  (1.803 m), weight 78.3 kg (172 lb 9.9 oz), SpO2 94.00%.  Intake/Output Summary (Last 24 hours) at 03/08/13 1005 Last data filed at 03/08/13 0900  Gross per 24 hour  Intake 2467.5 ml  Output    675 ml  Net 1792.5 ml     Exam: General: No acute respiratory distress evident at rest Lungs: severely decreased breath sounds- pt not taking a deep breath despite being asked to  Cardiovascular: Regular rate and rhythm without murmur gallop or rub normal S1 and S2 Abdomen: Nontender, nondistended, soft, bowel sounds positive, no rebound, no ascites, no appreciable mass Extremities: No significant cyanosis, clubbing, or edema bilateral lower extremities CNS : He is awake, however he is delirious and confused, no focal deficits,   Data Reviewed: Basic Metabolic Panel:  Recent Labs Lab 03/04/13 1720 03/05/13 1050 03/06/13 0450  NA 140 142 140  K 4.4 4.1 3.9  CL 103 106 107  CO2 25 24 22   GLUCOSE 169*  121* 81  BUN 24* 25* 26*  CREATININE 1.87* 1.77* 1.80*  CALCIUM 9.6 9.3 8.8  MG  --  1.9  --    Liver Function Tests:  Recent Labs Lab 03/04/13 1720 03/06/13 0450  AST 20 19  ALT 10 9  ALKPHOS 75 57  BILITOT 0.4 0.3  PROT 6.7 5.8*  ALBUMIN 3.5 2.8*   CBC:  Recent Labs Lab 03/04/13 1720 03/05/13 0230 03/06/13 0450  WBC 13.4* 8.5 6.5  NEUTROABS 12.4*  --   --   HGB 14.0 13.5 12.6*  HCT 40.1 38.7* 36.3*  MCV 90.3 90.0 90.5  PLT 160 131* 132*   Cardiac Enzymes:  Recent Labs Lab 03/04/13 1720 03/04/13 2122 03/05/13 0230 03/05/13 1050 03/06/13 0450  CKTOTAL 407*  --   --  529* 281*  TROPONINI <0.30 <0.30 <0.30 <0.30  --    BNP (last 3 results)  Recent Labs  03/05/13 0230  PROBNP 675.0*   CBG:  Recent Labs Lab 03/07/13 0808 03/07/13 1233 03/07/13 1735 03/07/13 2102 03/08/13 0751  GLUCAP 101* 110* 99 179* 92    Recent Results (from the past 240 hour(s))  CULTURE, RESPIRATORY (NON-EXPECTORATED)     Status: None   Collection Time    03/05/13 12:46 AM      Result Value Range Status   Specimen Description TRACHEAL ASPIRATE   Final   Special Requests NONE   Final   Gram Stain     Final   Value: FEW WBC PRESENT,BOTH PMN AND MONONUCLEAR     RARE SQUAMOUS EPITHELIAL CELLS PRESENT     FEW GRAM NEGATIVE RODS     FEW GRAM NEGATIVE COCCI     RARE GRAM POSITIVE COCCI   Culture     Final   Value: Non-Pathogenic Oropharyngeal-type Flora Isolated.     Performed at [facility]   Report Status 03/07/2013 FINAL   Final  MRSA PCR SCREENING     Status: Abnormal   Collection Time    03/05/13 12:48 AM      Result Value Range Status   MRSA by PCR POSITIVE (*) NEGATIVE Final   Comment:            The GeneXpert MRSA Assay (FDA     approved for NASAL specimens     only), is one component of a     comprehensive MRSA colonization     surveillance program. It is not     intended to diagnose MRSA     infection nor to guide or     monitor treatment for      MRSA infections.     RESULT CALLED TO, READ BACK BY AND VERIFIED WITH:     MHICKS,RN [phone] WILDERK  CULTURE, BLOOD (ROUTINE X 2)     Status: None   Collection Time    03/05/13  2:18 AM      Result Value Range Status   Specimen Description BLOOD  RIGHT ARM   Final   Special Requests BOTTLES DRAWN AEROBIC ONLY 3CC   Final   Culture  Setup Time     Final   Value: 03/05/2013 10:02     Performed at [facility]   Culture     Final   Value:        BLOOD CULTURE RECEIVED NO GROWTH TO DATE CULTURE WILL BE HELD FOR 5 DAYS BEFORE ISSUING A FINAL NEGATIVE REPORT     Performed at [facility]   Report Status PENDING   Incomplete  CULTURE, BLOOD (ROUTINE X 2)     Status: None   Collection Time    03/05/13  2:30 AM      Result Value Range Status   Specimen Description BLOOD LEFT HAND   Final   Special Requests BOTTLES DRAWN AEROBIC ONLY 3CC   Final   Culture  Setup Time     Final   Value: 03/05/2013 10:02     Performed at [facility]   Culture     Final   Value:        BLOOD CULTURE RECEIVED NO GROWTH TO DATE CULTURE WILL BE HELD FOR 5 DAYS BEFORE ISSUING A FINAL NEGATIVE REPORT     Performed at [facility]   Report Status PENDING   Incomplete     Studies:  Recent x-ray studies have been reviewed in detail by the Attending Physician  Scheduled Meds:  Scheduled Meds: . aspirin  81 mg Oral Daily  . Chlorhexidine Gluconate Cloth  6 each Topical Q0600  . donepezil  10 mg Oral QHS  . enoxaparin (LOVENOX) injection  30 mg Subcutaneous Q24H  . insulin aspart  0-9 Units Subcutaneous TID WC  . lisinopril  20 mg Oral Daily  . mirtazapine  7.5 mg Oral QHS  . mupirocin ointment  1 application Nasal BID  . piperacillin-tazobactam (ZOSYN)  IV  3.375 g Intravenous Q8H  . pneumococcal 23 valent vaccine  0.5 mL Intramuscular Tomorrow-1000  . simvastatin  40 mg Oral QHS  . vancomycin  1,000 mg Intravenous Q24H    Time spent on care of this patient: 35  mins   Leroy Sea, MD  [phone]  If 7PM-7AM, please contact night-coverage [email or web address] Password [email or web address] 03/08/2013, 10:05 AM   LOS: 4 days

## 2013-03-08 NOTE — Clinical Social Work Placement (Signed)
Clinical Social Work Department CLINICAL SOCIAL WORK PLACEMENT NOTE 03/08/2013  Patient:  [name], [name]  Account Number:  [id number] Admit date:  03/04/2013  Clinical Social Worker:  Etta Grandchild  Date/time:  03/08/2013 04:00 PM  Clinical Social Work is seeking post-discharge placement for this patient at the following level of care:   SKILLED NURSING   (*CSW will update this form in Epic as items are completed)   03/08/2013  Patient/family provided with [facility] Department of Clinical Social Work's list of facilities offering this level of care within the geographic area requested by the patient (or if unable, by the patient's family).    Patient/family informed of their freedom to choose among providers that offer the needed level of care, that participate in Medicare, Medicaid or managed care program needed by the patient, have an available bed and are willing to accept the patient.    Patient/family informed of [facility]' ownership interest in [facility], as well as of the fact that they are under no obligation to receive care at this facility.  PASARR submitted to EDS on  PASARR number received from EDS on   FL2 transmitted to all facilities in geographic area requested by pt/family on  03/08/2013 FL2 transmitted to all facilities within larger geographic area on   Patient informed that his/her managed care company has contracts with or will negotiate with  certain facilities, including the following:     Patient/family informed of bed offers received:   Patient chooses bed at  Physician recommends and patient chooses bed at    Patient to be transferred to  on   Patient to be transferred to facility by   The following physician request were entered in Epic:   Additional Comments:  Kathrin Penner, MSW, LCSW Weekend coverage
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
## 2013-03-09 LAB — BASIC METABOLIC PANEL
BUN: 20 mg/dL (ref 6–23)
CO2: 25 mEq/L (ref 19–32)
Creatinine, Ser: 1.75 mg/dL — ABNORMAL HIGH (ref 0.50–1.35)
GFR calc non Af Amer: 34 mL/min — ABNORMAL LOW (ref >90)
Glucose, Bld: 104 mg/dL — ABNORMAL HIGH (ref 70–99)
Potassium: 3.9 mEq/L (ref 3.5–5.1)

## 2013-03-09 LAB — GLUCOSE, CAPILLARY
Glucose-Capillary: 101 mg/dL — ABNORMAL HIGH (ref 70–99)
Glucose-Capillary: 111 mg/dL — ABNORMAL HIGH (ref 70–99)
Glucose-Capillary: 107 mg/dL — ABNORMAL HIGH (ref 70–99)
Glucose-Capillary: 168 mg/dL — ABNORMAL HIGH (ref 70–99)

## 2013-03-09 MED ORDER — LORAZEPAM 0.5 MG PO TABS
0.5000 mg | ORAL_TABLET | Freq: Once | ORAL | Status: AC
  Administered 2013-03-09: 0.5 mg via ORAL
  Filled 2013-03-09: qty 1

## 2013-03-09 MED ORDER — DIPHENHYDRAMINE HCL 25 MG PO CAPS
25.0000 mg | ORAL_CAPSULE | Freq: Once | ORAL | Status: AC
  Administered 2013-03-09: 25 mg via ORAL

## 2013-03-09 MED ORDER — DIPHENHYDRAMINE HCL 25 MG PO CAPS
25.0000 mg | ORAL_CAPSULE | Freq: Once | ORAL | Status: AC
  Administered 2013-03-09: 02:00:00 25 mg via ORAL
  Filled 2013-03-09 (×2): qty 1

## 2013-03-09 MED ORDER — DOXYCYCLINE HYCLATE 100 MG PO TABS
100.0000 mg | ORAL_TABLET | Freq: Two times a day (BID) | ORAL | Status: DC
  Administered 2013-03-09 – 2013-03-10 (×3): 100 mg via ORAL
  Filled 2013-03-09 (×4): qty 1

## 2013-03-09 NOTE — Progress Notes (Signed)
Pt called out stating "I can't sleep, I need something to put me to sleep, well not for good, but I just can't get to sleep asleep. I've been tossing and turning all night. There is nothing worst than wanting to go to sleep and can't." M. Burnadette Peter, NP on call notified order for benadryl 25mg  po x1 given. Medication given, will continue to monitor and assist as needed. Julien Nordmann [facility]

## 2013-03-09 NOTE — Progress Notes (Signed)
Lab on the unit to collect AM labs. Pt has finally gone to sleep at 4am, labs rescheduled until after breakfast. Pt currently in bed and asleep. Will continue to monitor and assist as needed. Julien Nordmann [facility]

## 2013-03-09 NOTE — Progress Notes (Signed)
[facility]  Progress Note     [name] ZOX:[id number] DOB: [birth year] DOA: 03/04/2013 PCP: [name], MD  Admit HPI / Brief Narrative:  79 yo male who was brought to [facility] ED after found on the floor at home unresponsive. It is unknown how long pt had been on the floor. He reported feeling week for a week, with subjective fevers, chills, cough and malaise. He also reported several days duration of abdominal discomfort with nausea and poor oral intake. Family reported noticing vomiting after they found him on the floor. He denied similar events in the past. He denied focal neurological symptoms, no headaches and no visual changes.   In ED, pt found to be hypoxic with saturations in mid 80's on RA but quickly responded to oxygen via [place] 4 L. CXR worrisome for PNA.   Assessment/Plan:   Acute hypoxic respiratory failure secondary to right-sided CAP  Much improved we'll switch from IV antibiotics to oral antibiotics, oxygen as needed along with nebulizer treatments. CT scan shows a large infiltrate, mass cannot be ruled out. Post discharge will need a repeat 2 view chest x-ray in 2-3 weeks time frame.    Syncope with fall as in #1 above From hypoxia? - CT head w/o acute findings     Abdominal aortic aneurysm diagnosed 2011 - quite large at 5.5 cm, discussed with his vascular surgeon Dr. Myra Gianotti who will see him in the office in 2-3 weeks post discharge no acute intervention needed. Patient does not have any abdominal pain at this time    Mild Rhabdomyolysis Keep hydrated - CK improved from 500s to 280s   Diabetes mellitus CBG well controlled  Lab Results  Component Value Date   HGBA1C 5.9* 03/05/2013    CBG (last 3)   Recent Labs  03/08/13 1702 03/08/13 2218 03/09/13 0759  GLUCAP 109* 115* 101*      Chronic kidney disease stage I Baseline creatinine approximately 1.7 as of June 2014 -    Coronary artery disease status post CABG 1996 No indication  of ACS    Hyperlipidemia - lipid profile reveals only a low HDL at 37    Mild bradycardia.  He remains asymptomatic, blood pressures are stable, no offending medications we will monitor closely. I will check a TSH.    Incidentally noted Right thyroid 2 cm lesion  Outpatient followup with endocrine.    Code Status: DO NOT RESUSCITATE  Family Communication: Discussed with primary decision maker patient's sister, agrees with placement if needed, agrees with gentle medical treatment, poor baseline quality of life if declines Comfort Care  Disposition Plan: Will need SNF   Consultants: None   Procedures: None    Antibiotics: Maxipime 9/23 Vancomycin 9/23 >> Zosyn 9/24 >>    DVT prophylaxis: lovenox    HPI/Subjective:  Patient in bed, is mildly confused, denies any headache chest or abdominal pain he is not short of breath.    Objective: Blood pressure 153/60, pulse 62, temperature 99 F (37.2 C), temperature source Oral, resp. rate 17, height 5\' 11"  (1.803 m), weight 78.3 kg (172 lb 9.9 oz), SpO2 92.00%.  Intake/Output Summary (Last 24 hours) at 03/09/13 0912 Last data filed at 03/09/13 8119  Gross per 24 hour  Intake 1606.25 ml  Output   1651 ml  Net -44.75 ml     Exam: General: No acute respiratory distress evident at rest Lungs: severely decreased breath sounds- pt not taking a deep breath despite being asked to  Cardiovascular: Regular rate and rhythm without murmur gallop or rub normal S1 and S2 Abdomen: Nontender, nondistended, soft, bowel sounds positive, no rebound, no ascites, no appreciable mass Extremities: No significant cyanosis, clubbing, or edema bilateral lower extremities CNS : He is awake, however he is delirious and confused, no focal deficits,   Data Reviewed: Basic Metabolic Panel:  Recent Labs Lab 03/04/13 1720 03/05/13 1050 03/06/13 0450  NA 140 142 140  K 4.4 4.1 3.9  CL 103 106 107  CO2 25 24 22   GLUCOSE 169*  121* 81  BUN 24* 25* 26*  CREATININE 1.87* 1.77* 1.80*  CALCIUM 9.6 9.3 8.8  MG  --  1.9  --    Liver Function Tests:  Recent Labs Lab 03/04/13 1720 03/06/13 0450  AST 20 19  ALT 10 9  ALKPHOS 75 57  BILITOT 0.4 0.3  PROT 6.7 5.8*  ALBUMIN 3.5 2.8*   CBC:  Recent Labs Lab 03/04/13 1720 03/05/13 0230 03/06/13 0450  WBC 13.4* 8.5 6.5  NEUTROABS 12.4*  --   --   HGB 14.0 13.5 12.6*  HCT 40.1 38.7* 36.3*  MCV 90.3 90.0 90.5  PLT 160 131* 132*   Cardiac Enzymes:  Recent Labs Lab 03/04/13 1720 03/04/13 2122 03/05/13 0230 03/05/13 1050 03/06/13 0450  CKTOTAL 407*  --   --  529* 281*  TROPONINI <0.30 <0.30 <0.30 <0.30  --    BNP (last 3 results)  Recent Labs  03/05/13 0230  PROBNP 675.0*   CBG:  Recent Labs Lab 03/08/13 0751 03/08/13 1159 03/08/13 1702 03/08/13 2218 03/09/13 0759  GLUCAP 92 113* 109* 115* 101*    Recent Results (from the past 240 hour(s))  CULTURE, RESPIRATORY (NON-EXPECTORATED)     Status: None   Collection Time    03/05/13 12:46 AM      Result Value Range Status   Specimen Description TRACHEAL ASPIRATE   Final   Special Requests NONE   Final   Gram Stain     Final   Value: FEW WBC PRESENT,BOTH PMN AND MONONUCLEAR     RARE SQUAMOUS EPITHELIAL CELLS PRESENT     FEW GRAM NEGATIVE RODS     FEW GRAM NEGATIVE COCCI     RARE GRAM POSITIVE COCCI   Culture     Final   Value: Non-Pathogenic Oropharyngeal-type Flora Isolated.     Performed at [facility]   Report Status 03/07/2013 FINAL   Final  MRSA PCR SCREENING     Status: Abnormal   Collection Time    03/05/13 12:48 AM      Result Value Range Status   MRSA by PCR POSITIVE (*) NEGATIVE Final   Comment:            The GeneXpert MRSA Assay (FDA     approved for NASAL specimens     only), is one component of a     comprehensive MRSA colonization     surveillance program. It is not     intended to diagnose MRSA     infection nor to guide or     monitor treatment for      MRSA infections.     RESULT CALLED TO, READ BACK BY AND VERIFIED WITH:     MHICKS,RN [phone] WILDERK  CULTURE, BLOOD (ROUTINE X 2)     Status: None   Collection Time    03/05/13  2:18 AM      Result Value Range Status   Specimen Description BLOOD  RIGHT ARM   Final   Special Requests BOTTLES DRAWN AEROBIC ONLY 3CC   Final   Culture  Setup Time     Final   Value: 03/05/2013 10:02     Performed at [facility]   Culture     Final   Value:        BLOOD CULTURE RECEIVED NO GROWTH TO DATE CULTURE WILL BE HELD FOR 5 DAYS BEFORE ISSUING A FINAL NEGATIVE REPORT     Performed at [facility]   Report Status PENDING   Incomplete  CULTURE, BLOOD (ROUTINE X 2)     Status: None   Collection Time    03/05/13  2:30 AM      Result Value Range Status   Specimen Description BLOOD LEFT HAND   Final   Special Requests BOTTLES DRAWN AEROBIC ONLY 3CC   Final   Culture  Setup Time     Final   Value: 03/05/2013 10:02     Performed at [facility]   Culture     Final   Value:        BLOOD CULTURE RECEIVED NO GROWTH TO DATE CULTURE WILL BE HELD FOR 5 DAYS BEFORE ISSUING A FINAL NEGATIVE REPORT     Performed at [facility]   Report Status PENDING   Incomplete     Studies:  Recent x-ray studies have been reviewed in detail by the Attending Physician  Scheduled Meds:  Scheduled Meds: . aspirin  81 mg Oral Daily  . Chlorhexidine Gluconate Cloth  6 each Topical Q0600  . donepezil  10 mg Oral QHS  . enoxaparin (LOVENOX) injection  30 mg Subcutaneous Q24H  . insulin aspart  0-9 Units Subcutaneous TID WC  . lisinopril  20 mg Oral Daily  . mirtazapine  7.5 mg Oral QHS  . mupirocin ointment  1 application Nasal BID  . piperacillin-tazobactam (ZOSYN)  IV  3.375 g Intravenous Q8H  . simvastatin  40 mg Oral QHS  . vancomycin  1,000 mg Intravenous Q24H    Time spent on care of this patient: 35 mins   Leroy Sea, MD  [phone]  If 7PM-7AM, please  contact night-coverage [email or web address] Password [email or web address] 03/09/2013, 9:12 AM   LOS: 5 days

## 2013-03-09 NOTE — Progress Notes (Signed)
03/09/13 1515  patient was seen sitting on floor by side of his bed . Staff arrived promptly to examine him for any injury. VS taken and were stable, no skin injury. Was slightly wet from condom cath fallen off.Pt was assisted by 2 back to bed alarm put on. Calls made to DON, Physician, family to inform of fall.

## 2013-03-09 NOTE — Progress Notes (Addendum)
Pt called out stating "Honey, my nerves are shot I need something for my nerves." M. Lynch,NP on call notified. One time order given for ativan 0.5mg  PO. Medication given, will continue to monitor and assist as needed. Julien Nordmann [facility]

## 2013-03-10 ENCOUNTER — Other Ambulatory Visit: Payer: Self-pay | Admitting: *Deleted

## 2013-03-10 DIAGNOSIS — J9600 Acute respiratory failure, unspecified whether with hypoxia or hypercapnia: Secondary | ICD-10-CM

## 2013-03-10 LAB — GLUCOSE, CAPILLARY
Glucose-Capillary: 100 mg/dL — ABNORMAL HIGH (ref 70–99)
Glucose-Capillary: 104 mg/dL — ABNORMAL HIGH (ref 70–99)

## 2013-03-10 MED ORDER — LISINOPRIL 20 MG PO TABS: 20.0000 mg | ORAL_TABLET | Freq: Every day | ORAL | Status: DC

## 2013-03-10 MED ORDER — ASPIRIN EC 81 MG PO TBEC
81.0000 mg | DELAYED_RELEASE_TABLET | Freq: Every day | ORAL | Status: DC
  Administered 2013-03-10: 13:00:00 81 mg via ORAL
  Filled 2013-03-10: qty 1

## 2013-03-10 MED ORDER — HYDRALAZINE HCL 50 MG PO TABS
50.0000 mg | ORAL_TABLET | Freq: Three times a day (TID) | ORAL | Status: DC
  Administered 2013-03-10: 50 mg via ORAL
  Filled 2013-03-10 (×3): qty 1

## 2013-03-10 MED ORDER — IBUPROFEN 600 MG PO TABS
600.0000 mg | ORAL_TABLET | Freq: Once | ORAL | Status: AC
  Administered 2013-03-10: 15:00:00 600 mg via ORAL
  Filled 2013-03-10: qty 1

## 2013-03-10 MED ORDER — HYDRALAZINE HCL 25 MG PO TABS
25.0000 mg | ORAL_TABLET | Freq: Three times a day (TID) | ORAL | Status: DC
  Filled 2013-03-10 (×3): qty 1

## 2013-03-10 MED ORDER — HYDRALAZINE HCL 25 MG PO TABS: 25.0000 mg | ORAL_TABLET | Freq: Three times a day (TID) | ORAL | Status: DC

## 2013-03-10 MED ORDER — DOXYCYCLINE HYCLATE 100 MG PO TABS: 100.0000 mg | ORAL_TABLET | Freq: Two times a day (BID) | ORAL | Status: DC

## 2013-03-10 NOTE — Progress Notes (Signed)
Patient discharged to [facility] nursing home via ambulance with condom catheter insitu, IVx2 were d/cd. Patient is stable, skin intact except for stage II ulcer to sacrum. Report called to receiving nurse at [facility].

## 2013-03-10 NOTE — Clinical Social Work Note (Signed)
Clinical Social Work Department CLINICAL SOCIAL WORK PLACEMENT NOTE 03/10/2013  Patient:  [name], [name]  Account Number:  [id number] Admit date:  03/04/2013  Clinical Social Worker:  Etta Grandchild  Date/time:  03/08/2013 04:00 PM  Clinical Social Work is seeking post-discharge placement for this patient at the following level of care:   SKILLED NURSING   (*CSW will update this form in Epic as items are completed)   03/08/2013  Patient/family provided with [facility] Department of Clinical Social Work's list of facilities offering this level of care within the geographic area requested by the patient (or if unable, by the patient's family).    Patient/family informed of their freedom to choose among providers that offer the needed level of care, that participate in Medicare, Medicaid or managed care program needed by the patient, have an available bed and are willing to accept the patient.    Patient/family informed of [facility]' ownership interest in [facility], as well as of the fact that they are under no obligation to receive care at this facility.  PASARR submitted to EDS on  PASARR number received from EDS on   FL2 transmitted to all facilities in geographic area requested by pt/family on  03/08/2013 FL2 transmitted to all facilities within larger geographic area on   Patient informed that his/her managed care company has contracts with or will negotiate with  certain facilities, including the following:     Patient/family informed of bed offers received:  03/10/2013 Patient chooses bed at [facility] Physician recommends and patient chooses bed at    Patient to be transferred to [facility] on  03/10/2013 Patient to be transferred to facility by Ambulance  The following physician request were entered in Epic:   Additional Comments: Per MD patient ready for DC 03/10/13. Patient DC to [facility] 03/10/13.  Patient, sister, nurse, and facility notified of DC. CSW set up ambulance transport to facility. Sister completed paper work at facility and states that she will meet patient at facility upon DC. CSW signing off.  Roddie Mc, [place], [place], [phone]

## 2013-03-10 NOTE — Discharge Summary (Signed)
Physician Discharge Summary  [name] ZOX:[id number] DOB: 06-19-1927 DOA: 03/04/2013  PCP: [name], MD  Admit date: 03/04/2013 Discharge date: 03/10/2013  Time spent: 60 minutes  Recommendations for Outpatient Follow-up:  1.  Needs follow up chest xray in 2 weeks to re-eval large right sided infiltrate and rule out mass.  Monitor bradycardia. Monitor BP.  Lisinopril dose reduced. Hydralazine added. 2.  Please refer to endocrinology outpatient for 2 cm right thyroid nodule. 3.  Follow with Dr. Myra Gianotti, of [facility] for AAA.  Discharge Diagnoses:  Principal Problem:   Acute respiratory failure with hypoxia Active Problems:   DM   HYPERLIPIDEMIA   HYPERTENSION   CHRONIC KIDNEY DISEASE STAGE I   Abdominal aneurysm without mention of rupture   S/P right colectomy   PNA (pneumonia)   Leukocytosis   Discharge Condition: stable.  Much improved.  Diet recommendation:  Soft Diet.  Filed Weights   03/05/13 0417 03/06/13 0444 03/06/13 1844  Weight: 74.4 kg (164 lb 0.4 oz) 74.4 kg (164 lb 0.4 oz) 78.3 kg (172 lb 9.9 oz)    History of present illness at the time of admission:  Pt is 79 yo male who was brought to [facility] ED after found on the floor at home and unresponsive. It is unknown how long pt has been on the floor and pt is not able to provide details. He reports feeling weak for the past week, has had subjective fevers, chills, cough and malaise. He also reports several days duration of abdominal discomfort with nausea and poor oral intake. Family reports noticing vomiting after they found him on the floor.  In the ED, pt found to be hypoxic with saturations in mid 80's on room air but quickly responded to oxygen via [place] 4 L. CXR worrisome for PNA.   Hospital Course:   Acute hypoxic respiratory failure secondary to right-sided CAP  Much improved.  Treated with antibiotics, nebulizers and oxygen.  CT scan shows a large infiltrate, mass cannot be ruled out.  Post discharge will need a repeat 2 view chest x-ray in 2-3 weeks time frame.   Syncope with fall as in #1 above  From hypoxia? - CT head w/o acute findings.  Evaluated by physical therapy and will discharge to [facility]illed [facility] for rehabilitation.  Abdominal aortic aneurysm diagnosed 2011  Quite large at 5.5 cm, discussed with his vascular surgeon,  Dr. Myra Gianotti,  who will see him in the office in 2-3 weeks post discharge. No acute intervention needed. Patient does not have any abdominal pain at this time   Mild Rhabdomyolysis  Keep hydrated - CK improved from 500s to 280s (on 9/25)  Diabetes mellitus  CBG well controlled.  Will Continue glipizide. Lab Results   Component  Value  Date    HGBA1C  5.9*  03/05/2013    CBG (last 3)   Recent Labs   03/08/13 1702  03/08/13 2218  03/09/13 0759   GLUCAP  109*  115*  101*    Chronic kidney disease stage 2 Baseline creatinine approximately 1.7 as of June 2014, and is at baseline now.  Coronary artery disease status post CABG 1996  No indication of ACS   Hyperlipidemia  Lipid profile reveals only a low HDL at 37.  Stable for outpatient follow up.  Mild bradycardia.  He remains asymptomatic, blood pressures are stable, no offending medications we will monitor closely. TSH is normal.  Incidentally noted Right thyroid 2 cm lesion  Outpatient followup with endocrine.     Discharge Exam: Filed Vitals:   03/10/13 1044  BP: 161/70  Pulse:   Temp:   Resp:    General: Awake, Alert, no apparent distress.  Left eye conjunctiva is reddened. Lungs: cta, no w/c/r, no accessory muscle movement. Cardiovascular: Regular rate and rhythm without murmur gallop or rub normal S1 and S2  Abdomen: Nontender, nondistended, soft, bowel sounds positive, no rebound, no ascites, no appreciable mass  Extremities: No significant cyanosis, clubbing, or edema bilateral lower extremities   Discharge Instructions      Discharge Orders   Future  Appointments Provider Department Dept Phone   03/31/2013 11:45 AM Nada Libman, MD [facility] [phone]   04/09/2013 8:00 AM [facility] [phone]   04/16/2013 4:00 PM [name], MD [facility] [phone]   07/21/2013 11:00 AM [facility] [place] [facility] [phone]   07/21/2013 12:00 PM Nada Libman, MD [facility] [phone]   Future Orders Complete By Expires   Diet - low sodium heart healthy  As directed    Diet - low sodium heart healthy  As directed    Increase activity slowly  As directed    Increase activity slowly  As directed        Medication List    STOP taking these medications       NAMENDA XR 7 MG Cp24  Generic drug:  Memantine HCl ER     naproxen sodium 220 MG tablet  Commonly known as:  ALEVE      TAKE these medications       aspirin 81 MG tablet  Take 81 mg by mouth daily. Take 1 tablet daily to prevent stroke and heart attack.     Blood Glucose Meter kit  Use as instructed     donepezil 5 MG tablet  Commonly known as:  ARICEPT  Take 2 tablets (10 mg total) by mouth at bedtime.     doxycycline 100 MG tablet  Commonly known as:  VIBRA-TABS  Take 1 tablet (100 mg total) by mouth every 12 (twelve) hours.     glipiZIDE 5 MG tablet  Commonly known as:  GLUCOTROL  Take 2.5-5 mg by mouth 2 (two) times daily before a meal. Takes 1 tab in the morning and 1/2 tab in the evening     hydrALAZINE 50 MG tablet  Commonly known as:  APRESOLINE  Take 1 tablet (50 mg total) by mouth 3 (three) times daily.     Lancets Misc  Use to check glucose     lisinopril 20 MG tablet  Commonly known as:  PRINIVIL,ZESTRIL  Take 1 tablet (20 mg total) by mouth daily.     mirtazapine 7.5 MG tablet  Commonly known as:  REMERON  Take 1 tablet (7.5 mg total) by mouth at bedtime.     simvastatin 40 MG tablet  Commonly known as:  ZOCOR   Take 40 mg by mouth at bedtime. Take 1 tablet once daily to lower cholesterol.       Allergies  Allergen Reactions  . Diovan [Valsartan]    Follow-up Information   Follow up with GREEN, [name], MD. Schedule an appointment as soon as possible for a visit in 2 weeks. (Repeat CXR needed in 2 weeks.)    Specialty:  Internal Medicine   Contact information:   [address] [place] [address] [phone]  Follow up with Jorge Ny, MD. Schedule an appointment as soon as possible for a visit in 2 weeks. (Important to follow AAA)    Specialty:  Vascular Surgery   Contact information:   [address] [place] [address] [phone]        The results of significant diagnostics from this hospitalization (including imaging, microbiology, ancillary and laboratory) are listed below for reference.    Significant Diagnostic Studies: Dg Chest 2 View  03/04/2013   CLINICAL DATA:  Fall, cough.  EXAM: CHEST  2 VIEW  COMPARISON:  09/06/2012  FINDINGS: The patient has had median sternotomy and CABG. The heart is mildly enlarged. Numerous old bilateral rib fractures are noted. There is increased density in the right infrahilar region, raising the question of adenopathy or mass. There is accentuation of markings at the right lung base, consistent with infiltrate, possibly postobstructive. Further evaluation with chest CT is recommended.  IMPRESSION: 1. Question of right lower lobe mass. CT of the chest is recommended. Contrast administration is recommended unless contraindicated. 2. Right lower lobe infiltrate, possibly postobstructive. 3. Multiple old rib fractures.   Electronically Signed   By: Rosalie Gums M.D.   On: 03/04/2013 17:11   Ct Head Wo Contrast  03/04/2013   CLINICAL DATA:  Fall.  EXAM: CT HEAD WITHOUT CONTRAST  CT CERVICAL SPINE WITHOUT CONTRAST  TECHNIQUE: Multidetector CT imaging of the head and cervical spine was performed following the standard protocol without  intravenous contrast. Multiplanar CT image reconstructions of the cervical spine were also generated.  COMPARISON:  08/19/2012.  FINDINGS: CT HEAD FINDINGS  No skull fracture or intracranial hemorrhage.  Atrophy. Ventricular prominence may be explained by atrophy although hydrocephalus not excluded. Appears similar to the prior exam.  Small vessel disease type changes without CT evidence of large acute infarct.  No intracranial mass lesion noted on this unenhanced exam. .  Vascular calcifications.  Partial opacification maxillary sinuses with air-fluid level on the right. No other findings to suggest orbital floor for are fracture. Clinical correlation recommended.  CT CERVICAL SPINE FINDINGS  Cervical spondylotic changes appear similar to prior exam as does alignment. No cervical spine fracture noted.  Biapical lung parenchymal changes may represent scarring more notable on the left unchanged.  Right thyroid 2 cm lesion unchanged. This can be assessed with elective thyroid ultrasound.  Carotid bifurcation calcifications.  IMPRESSION: CT HEAD IMPRESSION  No skull fracture or intracranial hemorrhage.  Atrophy. Ventricular prominence may be explained by atrophy although hydrocephalus not excluded. Appears similar to the prior exam.  Small vessel disease type changes without CT evidence of large acute infarct.  Partial opacification maxillary sinuses with air-fluid level on the right. No other findings to suggest orbital floor for are fracture. Clinical correlation recommended.  CT CERVICAL SPINE IMPRESSION  Cervical spondylotic changes appear similar to prior exam as does alignment. No cervical spine fracture noted.  Biapical lung parenchymal changes may represent scarring more notable on the left unchanged.  Right thyroid 2 cm lesion unchanged. This can be assessed with elective thyroid ultrasound.   Electronically Signed   By: Bridgett Larsson   On: 03/04/2013 17:12   Ct Chest Wo Contrast  03/06/2013   CLINICAL DATA:   79 year old male found down and unresponsive. Fever, nausea, abnormal chest x-ray. History of colon cancer.  EXAM: CT CHEST WITHOUT CONTRAST  TECHNIQUE: Multidetector CT imaging of the chest was performed following the standard protocol without IV contrast.  COMPARISON:  03/04/2013 and earlier.  FINDINGS: In the right lower lobe there is confluent ground-glass and nodular airspace opacity which is new. This is superimposed on dependent atelectasis present at both lung bases. In addition, there is similar scattered distal peribronchovascular ground-glass opacity in the superior segment of the lower lobe and in the right posterior upper lobe. Likewise, confluent and irregular peribronchovascular opacity is present in the left upper lobe and lingula.  Major airways remain patent. Stable nodular thickening along the left major fissure (series 3, image 40) since 2007.  Chronic bilateral posterior rib fractures. Sequelae of median sternotomy. Otherwise stable visualized osseous structures.  No pericardial or pleural effusion. Chronic Coronary artery calcified atherosclerosis. No definite mediastinal or hilar lymphadenopathy. Chronic exophytic low-density posterior right thyroid lobe nodule is stable since 2007.  Grossly stable and negative visualized non contrast upper abdominal viscera.  IMPRESSION: 1. Confluent abnormal right lower lobe opacity plus additional scattered bilateral upper lobe and lingula areas of involvement most compatible with acute pneumonia.  2. No strong evidence of chest malignancy or metastatic disease.  3. Benign chronic findings including posterior exophytic right thyroid nodule.   Electronically Signed   By: Augusto Gamble M.D.   On: 03/06/2013 15:49   Ct Cervical Spine Wo Contrast  03/04/2013   CLINICAL DATA:  Fall.  EXAM: CT HEAD WITHOUT CONTRAST  CT CERVICAL SPINE WITHOUT CONTRAST  TECHNIQUE: Multidetector CT imaging of the head and cervical spine was performed following the standard protocol  without intravenous contrast. Multiplanar CT image reconstructions of the cervical spine were also generated.  COMPARISON:  08/19/2012.  FINDINGS: CT HEAD FINDINGS  No skull fracture or intracranial hemorrhage.  Atrophy. Ventricular prominence may be explained by atrophy although hydrocephalus not excluded. Appears similar to the prior exam.  Small vessel disease type changes without CT evidence of large acute infarct.  No intracranial mass lesion noted on this unenhanced exam. .  Vascular calcifications.  Partial opacification maxillary sinuses with air-fluid level on the right. No other findings to suggest orbital floor for are fracture. Clinical correlation recommended.  CT CERVICAL SPINE FINDINGS  Cervical spondylotic changes appear similar to prior exam as does alignment. No cervical spine fracture noted.  Biapical lung parenchymal changes may represent scarring more notable on the left unchanged.  Right thyroid 2 cm lesion unchanged. This can be assessed with elective thyroid ultrasound.  Carotid bifurcation calcifications.  IMPRESSION: CT HEAD IMPRESSION  No skull fracture or intracranial hemorrhage.  Atrophy. Ventricular prominence may be explained by atrophy although hydrocephalus not excluded. Appears similar to the prior exam.  Small vessel disease type changes without CT evidence of large acute infarct.  Partial opacification maxillary sinuses with air-fluid level on the right. No other findings to suggest orbital floor for are fracture. Clinical correlation recommended.  CT CERVICAL SPINE IMPRESSION  Cervical spondylotic changes appear similar to prior exam as does alignment. No cervical spine fracture noted.  Biapical lung parenchymal changes may represent scarring more notable on the left unchanged.  Right thyroid 2 cm lesion unchanged. This can be assessed with elective thyroid ultrasound.   Electronically Signed   By: Bridgett Larsson   On: 03/04/2013 17:12   US Aorta  03/04/2013   CLINICAL DATA:   Abdominal aortic aneurysm. Fall.  EXAM: ULTRASOUND OF ABDOMINAL AORTA  TECHNIQUE: Ultrasound examination of the abdominal aorta was performed to evaluate for abdominal aortic aneurysm.  COMPARISON:  12/11/2011  FINDINGS: Abdominal Aorta  Fusiform infrarenal abdominal aortic aneurysm with chronic anterior mural thrombus  Maximum AP  Diameter:  4.8 cm  Maximum TRV  Diameter: 5.5 cm  Right common iliac artery 1.9 cm in AP dimension. The left common iliac artery AP diameter 1.7 cm.  IMPRESSION: 1. Increase in size of the infrarenal abdominal aortic aneurysm, now with maximum transverse diameter 5.5 cm (formerly 5.1 cm). Please note that this exam is fine for following aneurysm size, but if there is concern for leak or acute injury to the abdominal aorta then CT scan (with contrast a feasible) would offer greater sensitivity for acute aortic injury and would be the diagnostic examination of choice.   Electronically Signed   By: Herbie Baltimore   On: 03/04/2013 18:34    Microbiology: Recent Results (from the past 240 hour(s))  CULTURE, RESPIRATORY (NON-EXPECTORATED)     Status: None   Collection Time    03/05/13 12:46 AM      Result Value Range Status   Specimen Description TRACHEAL ASPIRATE   Final   Special Requests NONE   Final   Gram Stain     Final   Value: FEW WBC PRESENT,BOTH PMN AND MONONUCLEAR     RARE SQUAMOUS EPITHELIAL CELLS PRESENT     FEW GRAM NEGATIVE RODS     FEW GRAM NEGATIVE COCCI     RARE GRAM POSITIVE COCCI   Culture     Final   Value: Non-Pathogenic Oropharyngeal-type Flora Isolated.     Performed at [facility]   Report Status 03/07/2013 FINAL   Final  MRSA PCR SCREENING     Status: Abnormal   Collection Time    03/05/13 12:48 AM      Result Value Range Status   MRSA by PCR POSITIVE (*) NEGATIVE Final   Comment:            The GeneXpert MRSA Assay (FDA     approved for NASAL specimens     only), is one component of a     comprehensive MRSA colonization      surveillance program. It is not     intended to diagnose MRSA     infection nor to guide or     monitor treatment for     MRSA infections.     RESULT CALLED TO, READ BACK BY AND VERIFIED WITH:     MHICKS,RN [id number] WILDERK  CULTURE, BLOOD (ROUTINE X 2)     Status: None   Collection Time    03/05/13  2:18 AM      Result Value Range Status   Specimen Description BLOOD RIGHT ARM   Final   Special Requests BOTTLES DRAWN AEROBIC ONLY 3CC   Final   Culture  Setup Time     Final   Value: 03/05/2013 10:02     Performed at [facility]   Culture     Final   Value:        BLOOD CULTURE RECEIVED NO GROWTH TO DATE CULTURE WILL BE HELD FOR 5 DAYS BEFORE ISSUING A FINAL NEGATIVE REPORT     Performed at [facility]   Report Status PENDING   Incomplete  CULTURE, BLOOD (ROUTINE X 2)     Status: None   Collection Time    03/05/13  2:30 AM      Result Value Range Status   Specimen Description BLOOD LEFT HAND   Final   Special Requests BOTTLES DRAWN AEROBIC ONLY 3CC   Final   Culture  Setup Time     Final  Value: 03/05/2013 10:02     Performed at [facility]   Culture     Final   Value:        BLOOD CULTURE RECEIVED NO GROWTH TO DATE CULTURE WILL BE HELD FOR 5 DAYS BEFORE ISSUING A FINAL NEGATIVE REPORT     Performed at [facility]   Report Status PENDING   Incomplete     Labs: Basic Metabolic Panel:  Recent Labs Lab 03/04/13 1720 03/05/13 1050 03/06/13 0450 03/09/13 0828  NA 140 142 140 141  K 4.4 4.1 3.9 3.9  CL 103 106 107 107  CO2 25 24 22 25   GLUCOSE 169* 121* 81 104*  BUN 24* 25* 26* 20  CREATININE 1.87* 1.77* 1.80* 1.75*  CALCIUM 9.6 9.3 8.8 8.8  MG  --  1.9  --   --    Liver Function Tests:  Recent Labs Lab 03/04/13 1720 03/06/13 0450  AST 20 19  ALT 10 9  ALKPHOS 75 57  BILITOT 0.4 0.3  PROT 6.7 5.8*  ALBUMIN 3.5 2.8*   CBC:  Recent Labs Lab 03/04/13 1720 03/05/13 0230 03/06/13 0450  WBC 13.4* 8.5 6.5  NEUTROABS  12.4*  --   --   HGB 14.0 13.5 12.6*  HCT 40.1 38.7* 36.3*  MCV 90.3 90.0 90.5  PLT 160 131* 132*   Cardiac Enzymes:  Recent Labs Lab 03/04/13 1720 03/04/13 2122 03/05/13 0230 03/05/13 1050 03/06/13 0450  CKTOTAL 407*  --   --  529* 281*  TROPONINI <0.30 <0.30 <0.30 <0.30  --    BNP: BNP (last 3 results)  Recent Labs  03/05/13 0230  PROBNP 675.0*   CBG:  Recent Labs Lab 03/09/13 0759 03/09/13 1143 03/09/13 1711 03/09/13 2149 03/10/13 0813  GLUCAP 101* 111* 107* 168* 106*     Signed:  Stephani Police, PA-C  [facility] 03/10/2013, 10:57 AM

## 2013-03-10 NOTE — Care Management Note (Signed)
    Page 1 of 1   03/10/2013     2:42:57 PM   CARE MANAGEMENT NOTE 03/10/2013  Patient:  [name], [name]   Account Number:  [id number]  Date Initiated:  03/05/2013  Documentation initiated by:  MAYO,HENRIETTA  Subjective/Objective Assessment:   adm with dx of resp failure; lives alone    PCP Dr Murray Hodgkins     Action/Plan:   Anticipated DC Date:  03/10/2013   Anticipated DC Plan:  SKILLED NURSING FACILITY  In-house referral  Clinical Social Worker      DC Planning Services  CM consult      Choice offered to / List presented to:             Status of service:  Completed, signed off Medicare Important Message given?   (If response is "NO", the following Medicare IM given date fields will be blank) Date Medicare IM given:   Date Additional Medicare IM given:    Discharge Disposition:  SKILLED NURSING FACILITY  Per UR Regulation:  Reviewed for med. necessity/level of care/duration of stay  If discussed at Long Length of Stay Meetings, dates discussed:    Comments:  03/10/13 14:42 Letha Cape RN, BSN [phone] patient is for dc today to [facility], CSW following.

## 2013-03-11 ENCOUNTER — Encounter: Payer: Self-pay | Admitting: Internal Medicine

## 2013-03-11 ENCOUNTER — Non-Acute Institutional Stay (SKILLED_NURSING_FACILITY): Payer: Medicare Other | Admitting: Internal Medicine

## 2013-03-11 ENCOUNTER — Other Ambulatory Visit: Payer: Self-pay | Admitting: *Deleted

## 2013-03-11 DIAGNOSIS — J189 Pneumonia, unspecified organism: Secondary | ICD-10-CM

## 2013-03-11 DIAGNOSIS — I714 Abdominal aortic aneurysm, without rupture, unspecified: Secondary | ICD-10-CM

## 2013-03-11 DIAGNOSIS — E119 Type 2 diabetes mellitus without complications: Secondary | ICD-10-CM

## 2013-03-11 DIAGNOSIS — E785 Hyperlipidemia, unspecified: Secondary | ICD-10-CM

## 2013-03-11 DIAGNOSIS — I10 Essential (primary) hypertension: Secondary | ICD-10-CM

## 2013-03-11 DIAGNOSIS — F4323 Adjustment disorder with mixed anxiety and depressed mood: Secondary | ICD-10-CM

## 2013-03-11 DIAGNOSIS — F0390 Unspecified dementia without behavioral disturbance: Secondary | ICD-10-CM

## 2013-03-11 LAB — CULTURE, BLOOD (ROUTINE X 2)
Culture: NO GROWTH
Culture: NO GROWTH

## 2013-03-11 NOTE — Assessment & Plan Note (Signed)
Continue aricept;Remeron for appetite

## 2013-03-11 NOTE — Progress Notes (Signed)
MRN: [id number] Name: [name]  Sex: male Age: 79 y.o. DOB: December 01, 1927  PSC #: [facility] Facility/Room: 107 Level Of Care: SNF Provider: Merrilee Seashore D Emergency Contacts: Extended Emergency Contact Information Primary Emergency Contact: Deatherage,Vera Address: [address]          [place] [address] [place] Home Phone: [phone] Relation: Sister Secondary Emergency Contact: Deatherage,Ralph  [place] Mobile Phone: [phone] Relation: Other  Code Status: FULL  Allergies: Diovan  Chief Complaint  Patient presents with  . nursing home admission    HPI: Patient is 79 y.o. male who lives alone so is here to work with PT before going back home.   Past Medical History  Diagnosis Date  . Arthritis   . Depression   . Diabetes mellitus   . Hypertension   . Hyperlipidemia   . Myocardial infarction 1984  . Colon cancer   . AAA (abdominal aortic aneurysm) 2011  . CAD (coronary artery disease) 2003  . Chronic kidney disease   . Anemia   . Diverticula of colon 2013  . Hypertrophy of prostate without urinary obstruction and other lower urinary tract symptoms (LUTS) 2008  . Hyperpotassemia 2008  . Gout, unspecified 2008  . Impacted cerumen 06/20/2006  . First degree atrioventricular block 2008  . Abnormality of gait 2007  . Other specified cardiac dysrhythmias(427.89) 2005  . Unspecified disorder of kidney and ureter 2005  . Edema 2005  . Malignant neoplasm of colon, unspecified site 2004  . Unspecified hereditary and idiopathic peripheral neuropathy 2004  . Chest pain, unspecified 2003  . Pain in joint, pelvic region and thigh 2004  . Pain in limb 2003    Past Surgical History  Procedure Laterality Date  . Mediansternotomy    . Right colectomy  2004  . Coronary artery bypass graft  1996    Bartle, MD  . Hernia repair Right 1952    In [employer]      Medication List       This list is accurate as of: 03/11/13   3:25 PM.  Always use your most recent med list.               aspirin 81 MG tablet  Take 81 mg by mouth daily. Take 1 tablet daily to prevent stroke and heart attack.     Blood Glucose Meter kit  Use as instructed     donepezil 5 MG tablet  Commonly known as:  ARICEPT  Take 2 tablets (10 mg total) by mouth at bedtime.     doxycycline 100 MG tablet  Commonly known as:  VIBRA-TABS  Take 1 tablet (100 mg total) by mouth every 12 (twelve) hours.     glipiZIDE 5 MG tablet  Commonly known as:  GLUCOTROL  Take 2.5-5 mg by mouth 2 (two) times daily before a meal. Takes 1 tab in the morning and 1/2 tab in the evening     hydrALAZINE 25 MG tablet  Commonly known as:  APRESOLINE  Take 1 tablet (25 mg total) by mouth 3 (three) times daily.     Lancets Misc  Use to check glucose     lisinopril 20 MG tablet  Commonly known as:  PRINIVIL,ZESTRIL  Take 1 tablet (20 mg total) by mouth daily.     mirtazapine 7.5 MG tablet  Commonly known as:  REMERON  Take 1 tablet (7.5 mg total) by mouth at bedtime.     simvastatin 40 MG tablet  Commonly known as:  ZOCOR  Take 40 mg by mouth at bedtime. Take 1 tablet once daily to lower cholesterol.        No orders of the defined types were placed in this encounter.    Immunization History  Administered Date(s) Administered  . Influenza,inj,Quad PF,36+ Mos 03/07/2013  . Pneumococcal Conjugate 06/13/1999  . Pneumococcal Polysaccharide 03/08/2013  . Td 06/12/1993    History  Substance Use Topics  . Smoking status: Former Smoker    Types: Cigarettes    Quit date: 06/12/1982  . Smokeless tobacco: Never Used  . Alcohol Use: Yes    Review of Systems  DATA OBTAINED: from patient, nurse GENERAL: Feels well no fevers, fatigue, appetite changes SKIN: No itching, rash or wounds EYES: No eye pain, redness, discharge EARS: No earache, tinnitus, change in hearing NOSE: No congestion, drainage or bleeding  MOUTH/THROAT: No mouth or tooth  pain, No sore throat, No difficulty chewing or swallowing  RESPIRATORY: No cough, wheezing, SOB CARDIAC: No chest pain, palpitations, lower extremity edema  GI: No abdominal pain, No N/V/D or constipation, No heartburn or reflux  GU: No dysuria, frequency or urgency, or incontinence  MUSCULOSKELETAL: No unrelieved bone/joint pain NEUROLOGIC: no c/o PSYCHIATRIC: PT IS VERY UPSET.HE WAS TOLD HE WOULD BE HERE FOR 8 WEEKS AND HE COULDN'T WALK AROUND.HE THOUGHT HIS DOG WOULD BE HERE. HE CAN'T SLEEP.HE NEEDS SOMETHING FOR SLEEP AND SOMETHING FOR HIS NERVES.    Filed Vitals:   03/11/13 1232  BP: 180/83  Pulse: 81  Temp: 97 F (36.1 C)  Resp: 19    Physical Exam  GENERAL APPEARANCE: Alert, conversant. Appropriately groomed. EMOTIONALLY UPSET SKIN: No diaphoresis rash, or wounds HEAD: Normocephalic, atraumatic  EYES: Conjunctiva/lids clear. Pupils round, reactive. EOMs intact.  EARS: External exam WNL, canals clear. Hearing grossly normal.  NOSE: No deformity or discharge.  MOUTH/THROAT: Lips w/o lesions RESPIRATORY: Breathing is even, unlabored. Lung sounds are clear   CARDIOVASCULAR: Heart RRR  with freq irregularities no murmurs, rubs or gallops. No peripheral edema.  GASTROINTESTINAL: Abdomen is soft, non-tender, not distended w/ normal bowel sounds GENITOURINARY: Bladder non tender, not distended  MUSCULOSKELETAL: No abnormal joints or musculature NEUROLOGIC: Oriented X3. Cranial nerves 2-12 grossly intact. Moves all extremities no tremor. PSYCHIATRIC: pt is distraught, emotional, burst into tears several times, he misses his dog, he is depressed and anxious about the future  Patient Active Problem List   Diagnosis Date Noted  . Acute respiratory failure with hypoxia 03/04/2013  . PNA (pneumonia) 03/04/2013  . Leukocytosis 03/04/2013  . Memory loss 02/01/2013  . Colon cancer 02/01/2013  . Onychogryphosis 01/15/2013  . Insomnia, unspecified 12/18/2012  . Loss of weight  11/28/2012  . Adjustment disorder with mixed anxiety and depressed mood 11/28/2012  . Dementia without behavioral disturbance 09/09/2012  . S/P right colectomy 06/23/2011  . Diverticulosis of colon (without mention of hemorrhage) 06/23/2011  . Abdominal aneurysm without mention of rupture 05/29/2011  . DM 09/26/2007  . HYPERLIPIDEMIA 09/26/2007  . ANEMIA 09/26/2007  . PERIPHERAL NEUROPATHY 09/26/2007  . HYPERTENSION 09/26/2007  . COR ATHEROSLERO UNSPEC TYPE VESSEL NATIVE/GRAFT 09/26/2007  . BRADYCARDIA 09/26/2007  . CHRONIC KIDNEY DISEASE STAGE I 09/26/2007  . ORGANIC IMPOTENCE 09/26/2007  . DEGENERATIVE JOINT DISEASE 09/26/2007  . ABNORMALITY OF GAIT 09/26/2007  . NEOPLASM, MALIGNANT, COLO-RECTAL, HX OF 09/26/2007    CBC    Component Value Date/Time   WBC 6.5 03/06/2013 0450   WBC 5.3 11/20/2012 1618   WBC 6.3 06/28/2007  1132   RBC 4.01* 03/06/2013 0450   RBC 4.05* 11/20/2012 1618   RBC 4.62 06/28/2007 1132   HGB 12.6* 03/06/2013 0450   HGB 14.4 06/28/2007 1132   HCT 36.3* 03/06/2013 0450   HCT 42.0 06/28/2007 1132   PLT 132* 03/06/2013 0450   PLT 164 06/28/2007 1132   MCV 90.5 03/06/2013 0450   MCV 90.8 06/28/2007 1132   LYMPHSABS 0.4* 03/04/2013 1720   LYMPHSABS 1.3 11/20/2012 1618   LYMPHSABS 1.5 06/28/2007 1132   MONOABS 0.6 03/04/2013 1720   MONOABS 0.3 06/28/2007 1132   EOSABS 0.0 03/04/2013 1720   EOSABS 0.1 11/20/2012 1618   BASOSABS 0.0 03/04/2013 1720   BASOSABS 0.0 11/20/2012 1618   BASOSABS 0.0 06/28/2007 1132    CMP     Component Value Date/Time   NA 141 03/09/2013 0828   NA 142 11/20/2012 1618   K 3.9 03/09/2013 0828   CL 107 03/09/2013 0828   CO2 25 03/09/2013 0828   GLUCOSE 104* 03/09/2013 0828   GLUCOSE 150* 11/20/2012 1618   BUN 20 03/09/2013 0828   BUN 30* 11/20/2012 1618   CREATININE 1.75* 03/09/2013 0828   CREATININE 1.90* 05/29/2011 1040   CALCIUM 8.8 03/09/2013 0828   PROT 5.8* 03/06/2013 0450   ALBUMIN 2.8* 03/06/2013 0450   AST 19 03/06/2013 0450   ALT 9  03/06/2013 0450   ALKPHOS 57 03/06/2013 0450   BILITOT 0.3 03/06/2013 0450   GFRNONAA 34* 03/09/2013 0828   GFRAA 40* 03/09/2013 0828    Assessment and Plan  PNA (pneumonia) Pt on doxycycline for a week;pt was treated with IV antibiotics, O2 and nebs in the hospital; no strong findings on Chest CT to support mass but would like f/u CXR in 2 weeks  HYPERTENSION Pt 's BP is high today and last reading was 156/80; his hosp d/c reading was 161/70; will increase his hydralazine to 75 mg TID for a week, then 100 mg TID if needed  DM Will continue glucatrol;last HbA1c 03/05/2013 was 5.9  Dementia without behavioral disturbance Continue aricept;Remeron for appetite  HYPERLIPIDEMIA Continue simvastatin 40 mg daily; FLP was good except HDL 37  Abdominal aneurysm without mention of rupture Dx 2011-was 5.5 cm;pt has appt to se eDr Myra Gianotti, [facility] 03/31/2013 at 11:45 to discuss.  Adjustment disorder with mixed anxiety and depressed mood Pt is anxious, depressed, unhappy and missing his dog;needs something for sleep,apparently remeron is not getting it now that pt is not at home;will start Zoloft 50 mg qHS, restoril 7.5 mg qHS prn and xanax 0.25 po BID; will watch for too much sedation    Margit Hanks, MD

## 2013-03-11 NOTE — Assessment & Plan Note (Signed)
Pt is anxious, depressed, unhappy and missing his dog;needs something for sleep,apparently remeron is not getting it now that pt is not at home;will start Zoloft 50 mg qHS, restoril 7.5 mg qHS prn and xanax 0.25 po BID; will watch for too much sedation

## 2013-03-11 NOTE — Assessment & Plan Note (Signed)
Will continue glucatrol;last HbA1c 03/05/2013 was 5.9

## 2013-03-11 NOTE — Assessment & Plan Note (Signed)
Dx 2011-was 5.5 cm;pt has appt to se eDr Myra Gianotti, [facility] 03/31/2013 at 11:45 to discuss.

## 2013-03-11 NOTE — Assessment & Plan Note (Addendum)
Pt 's BP is high today and last reading was 156/80; his hosp d/c reading was 161/70; will increase his hydralazine to 75 mg TID for a week, then 100 mg TID if needed

## 2013-03-11 NOTE — Assessment & Plan Note (Signed)
Continue simvastatin 40 mg daily; FLP was good except HDL 37

## 2013-03-11 NOTE — Assessment & Plan Note (Addendum)
Pt on doxycycline for a week;pt was treated with IV antibiotics, O2 and nebs in the hospital; no strong findings on Chest CT to support mass but would like f/u CXR in 2 weeks

## 2013-03-12 ENCOUNTER — Telehealth: Payer: Self-pay | Admitting: Surgery

## 2013-03-12 NOTE — Telephone Encounter (Addendum)
Message copied by Rosalyn Charters on Wed Mar 12, 2013 10:13 AM ------      Message from: Nada Libman      Created: Fri Mar 07, 2013  8:01 AM       pls have pt come and seee me in office for eval of AAA.  He needs a Non-contrast CT scan before.  Please specify 2mm cuts on the CT scan ------  notified [facility] of ct appt. at  imaging on 03-31-13 10:15 am and then to fu with dr. Myra Gianotti at 11:45 am in the office

## 2013-03-14 ENCOUNTER — Other Ambulatory Visit: Payer: Self-pay | Admitting: *Deleted

## 2013-03-28 ENCOUNTER — Encounter: Payer: Self-pay | Admitting: Surgery

## 2013-03-31 ENCOUNTER — Other Ambulatory Visit: Payer: Medicare Other

## 2013-03-31 ENCOUNTER — Ambulatory Visit: Payer: Medicare Other | Admitting: Surgery

## 2013-04-01 ENCOUNTER — Non-Acute Institutional Stay (SKILLED_NURSING_FACILITY): Payer: Medicare Other | Admitting: Nurse Practitioner

## 2013-04-01 DIAGNOSIS — E785 Hyperlipidemia, unspecified: Secondary | ICD-10-CM

## 2013-04-01 DIAGNOSIS — D649 Anemia, unspecified: Secondary | ICD-10-CM

## 2013-04-01 DIAGNOSIS — R413 Other amnesia: Secondary | ICD-10-CM

## 2013-04-01 DIAGNOSIS — E041 Nontoxic single thyroid nodule: Secondary | ICD-10-CM

## 2013-04-01 DIAGNOSIS — J9600 Acute respiratory failure, unspecified whether with hypoxia or hypercapnia: Secondary | ICD-10-CM

## 2013-04-01 DIAGNOSIS — N181 Chronic kidney disease, stage 1: Secondary | ICD-10-CM

## 2013-04-01 DIAGNOSIS — J9601 Acute respiratory failure with hypoxia: Secondary | ICD-10-CM

## 2013-04-01 DIAGNOSIS — R634 Abnormal weight loss: Secondary | ICD-10-CM

## 2013-04-01 DIAGNOSIS — J189 Pneumonia, unspecified organism: Secondary | ICD-10-CM

## 2013-04-01 DIAGNOSIS — E119 Type 2 diabetes mellitus without complications: Secondary | ICD-10-CM

## 2013-04-01 DIAGNOSIS — I714 Abdominal aortic aneurysm, without rupture: Secondary | ICD-10-CM

## 2013-04-01 DIAGNOSIS — I10 Essential (primary) hypertension: Secondary | ICD-10-CM

## 2013-04-01 NOTE — Progress Notes (Signed)
Patient ID: [name], male   DOB: [birth year], 79 y.o.   MRN: [id number]   PCP: [name], MD   Allergies  Allergen Reactions  . Diovan [Valsartan]     Chief Complaint  Patient presents with  . Medical Managment of Chronic Issues    HPI:  Pt is 79 yo male who was brought to [facility] ED after found on the floor at home and unresponsive. It is unknown how long pt has been on the floor he was found to have CAP and has been treated with antibiotics; pt is now at [facility] for STR before returning home. Currently pt reports he is doing well.  Pt reports shortness of breath in therapy but this resolved with rest rather quickly. Pt denies chest pain Reports some nausea when he doesn't eat (states he does not like the food and therefore he is not going to eat). Nursing without concerns  Review of Systems:  Review of Systems  Constitutional: Negative for fever, chills and weight loss.  HENT: Negative for congestion.   Eyes: Negative.   Respiratory: Negative for cough, shortness of breath and wheezing.   Cardiovascular: Negative for chest pain and leg swelling.  Gastrointestinal: Positive for nausea. Negative for heartburn, vomiting, abdominal pain, diarrhea and constipation.  Genitourinary: Negative for dysuria, urgency and frequency.  Musculoskeletal: Negative for falls and myalgias.  Skin: Negative for itching and rash.  Neurological: Positive for weakness (generalized). Negative for dizziness, tingling and headaches.  Psychiatric/Behavioral: Positive for depression and memory loss. The patient does not have insomnia.      Past Medical History  Diagnosis Date  . Arthritis   . Depression   . Diabetes mellitus   . Hypertension   . Hyperlipidemia   . Myocardial infarction 1984  . Colon cancer   . AAA (abdominal aortic aneurysm) 2011  . CAD (coronary artery disease) 2003  . Chronic kidney disease   . Anemia   . Diverticula of colon 2013  . Hypertrophy of prostate without  urinary obstruction and other lower urinary tract symptoms (LUTS) 2008  . Hyperpotassemia 2008  . Gout, unspecified 2008  . Impacted cerumen 06/20/2006  . First degree atrioventricular block 2008  . Abnormality of gait 2007  . Other specified cardiac dysrhythmias(427.89) 2005  . Unspecified disorder of kidney and ureter 2005  . Edema 2005  . Malignant neoplasm of colon, unspecified site 2004  . Unspecified hereditary and idiopathic peripheral neuropathy 2004  . Chest pain, unspecified 2003  . Pain in joint, pelvic region and thigh 2004  . Pain in limb 2003   Past Surgical History  Procedure Laterality Date  . Mediansternotomy    . Right colectomy  2004  . Coronary artery bypass graft  1996    Bartle, MD  . Hernia repair Right 1952    In [employer]   Social History:   reports that he quit smoking about 30 years ago. His smoking use included Cigarettes. He smoked 0.00 packs per day. He has never used smokeless tobacco. He reports that he drinks alcohol. He reports that he does not use illicit drugs.  Family History  Problem Relation Age of Onset  . Heart disease Father   . Pancreatic cancer Sister   . Cancer Sister     liver  . Cancer Brother     Leukemia  . Cancer Mother     ?    Medications: Patient's Medications  New Prescriptions   No medications on file  Previous  Medications   ASPIRIN 81 MG TABLET    Take 81 mg by mouth daily. Take 1 tablet daily to prevent stroke and heart attack.   BLOOD GLUCOSE MONITORING SUPPL (BLOOD GLUCOSE METER) KIT    Use as instructed   DONEPEZIL (ARICEPT) 5 MG TABLET    Take 2 tablets (10 mg total) by mouth at bedtime.   DOXYCYCLINE (VIBRA-TABS) 100 MG TABLET    Take 1 tablet (100 mg total) by mouth every 12 (twelve) hours.   GLIPIZIDE (GLUCOTROL) 5 MG TABLET    Take 2.5-5 mg by mouth 2 (two) times daily before a meal. Takes 1 tab in the morning and 1/2 tab in the evening   HYDRALAZINE (APRESOLINE) 25 MG TABLET    Take 1 tablet (25 mg total) by  mouth 3 (three) times daily.   LANCETS MISC    Use to check glucose   LISINOPRIL (PRINIVIL,ZESTRIL) 20 MG TABLET    Take 1 tablet (20 mg total) by mouth daily.   MIRTAZAPINE (REMERON) 7.5 MG TABLET    Take 1 tablet (7.5 mg total) by mouth at bedtime.   SIMVASTATIN (ZOCOR) 40 MG TABLET    Take 40 mg by mouth at bedtime. Take 1 tablet once daily to lower cholesterol.  Modified Medications   No medications on file  Discontinued Medications   No medications on file     Physical Exam:  Filed Vitals:   04/01/13 1146  BP: 120/56  Pulse: 88  Temp: 98.1 F (36.7 C)  Resp: 18  Weight: 165 lb 3.2 oz (74.934 kg)  SpO2: 99%   Physical Exam  Constitutional: He is well-developed, well-nourished, and in no distress. No distress.  HENT:  Head: Normocephalic and atraumatic.  Right Ear: External ear normal.  Left Ear: External ear normal.  Nose: Nose normal.  Mouth/Throat: Oropharynx is clear and moist. No oropharyngeal exudate.  Eyes: Conjunctivae and EOM are normal. Pupils are equal, round, and reactive to light.  Neck: Normal range of motion. Neck supple.  Cardiovascular: Normal rate, regular rhythm and normal heart sounds.   Pulmonary/Chest: Effort normal and breath sounds normal. No respiratory distress.  Abdominal: Soft. Normal appearance and bowel sounds are normal. He exhibits pulsatile midline mass. He exhibits no distension. There is no tenderness.  Musculoskeletal: Normal range of motion. He exhibits no edema and no tenderness.  Lymphadenopathy:    He has no cervical adenopathy.  Neurological: He is alert.  Skin: Skin is warm and dry. He is not diaphoretic.      Labs reviewed: Basic Metabolic Panel:  Recent Labs  16/10/96 1720 03/05/13 1050 03/06/13 0450 03/09/13 0828  NA 140 142 140 141  K 4.4 4.1 3.9 3.9  CL 103 106 107 107  CO2 25 24 22 25   GLUCOSE 169* 121* 81 104*  BUN 24* 25* 26* 20  CREATININE 1.87* 1.77* 1.80* 1.75*  CALCIUM 9.6 9.3 8.8 8.8  MG  --  1.9   --   --    Liver Function Tests:  Recent Labs  03/04/13 1720 03/06/13 0450  AST 20 19  ALT 10 9  ALKPHOS 75 57  BILITOT 0.4 0.3  PROT 6.7 5.8*  ALBUMIN 3.5 2.8*   No results found for this basename: LIPASE, AMYLASE,  in the last 8760 hours  Recent Labs  03/06/13 0450  AMMONIA 18   CBC:  Recent Labs  11/20/12 1618 03/04/13 1720 03/05/13 0230 03/06/13 0450  WBC 5.3 13.4* 8.5 6.5  NEUTROABS 3.6 12.4*  --   --  HGB 12.6 14.0 13.5 12.6*  HCT 36.7* 40.1 38.7* 36.3*  MCV 91 90.3 90.0 90.5  PLT  --  160 131* 132*   Cardiac Enzymes:  Recent Labs  03/04/13 1720 03/04/13 2122 03/05/13 0230 03/05/13 1050 03/06/13 0450  CKTOTAL 407*  --   --  529* 281*  TROPONINI <0.30 <0.30 <0.30 <0.30  --    BNP: No components found with this basename: POCBNP,  CBG:  Recent Labs  03/10/13 0813 03/10/13 1235 03/10/13 1712  GLUCAP 106* 100* 104*   CBC NO Diff (Complete Blood Count)       Result: 03/21/2013 2:29 PM    ( Status: F )            WBC  7.9        4.0-10.5  K/uL  SLN       RBC  4.21     L  4.22-5.81  MIL/uL  SLN       Hemoglobin  12.6     L  13.0-17.0  g/dL  SLN       Hematocrit  36.5     L  39.0-52.0  %  SLN       MCV  86.7        78.0-100.0  fL  SLN       MCH  29.9        26.0-34.0  pg  SLN       MCHC  34.5        30.0-36.0  g/dL  SLN       RDW  16.1        11.5-15.5  %  SLN       Platelet Count  256        150-400  K/uL  SLN      Basic Metabolic Panel       Result: 03/21/2013 3:07 PM    ( Status: F )            Sodium  139        135-145  mEq/L  SLN       Potassium  4.4        3.5-5.3  mEq/L  SLN       Chloride  109        96-112  mEq/L  SLN       CO2  24        19-32  mEq/L  SLN       Glucose  85        70-99  mg/dL  SLN       BUN  38     H  6-23  mg/dL  SLN       Creatinine  2.12     H  0.50-1.35  mg/dL  SLN       Calcium  9.0        8.4-10.5  mg/dL  SLN      Lipid Profile       Result: 03/21/2013 3:07 PM    ( Status: F )            Cholesterol  93         0-200  mg/dL  SLN  C     Triglyceride  55        <150  mg/dL  SLN       HDL Cholesterol  32     L  >39  mg/dL  SLN  Total Chol/HDL Ratio  2.9         Ratio  SLN       VLDL Cholesterol (Calc)  11        0-40  mg/dL  SLN       LDL Cholesterol (Calc)  50        0-99  mg/dL  SLN  C    Liver Profile       Result: 03/21/2013 3:07 PM    ( Status: F )            Bilirubin, Total  0.5        0.3-1.2  mg/dL  SLN       Bilirubin, Direct  0.1        0.0-0.3  mg/dL  SLN       Indirect Bilirubin  0.4        0.0-0.9  mg/dL  SLN       Alkaline Phosphatase  64        39-117  U/L  SLN       AST/SGOT  16        0-37  U/L  SLN       ALT/SGPT  12        0-53  U/L  SLN       Total Protein  5.2     L  6.0-8.3  g/dL  SLN       Albumin  3.2     L  3.5-5.2  g/dL  SLN      Hemoglobin G2X       Result: 03/21/2013 4:02 PM    ( Status: F )            Hemoglobin A1C  5.9     H  <5.7  %  SLN  C     Estimated Average Glucose  123     H       Assessment/Plan 1. PNA (pneumonia) Resolved; follow up chest xray shows no acute or active cardiopulmonary process; rib fractures have healed- pt still on doxycycline will dc at this time  2. Acute respiratory failure with hypoxia Resolved O2 sats stable off o2  3. HYPERTENSION Patient is stable; continue current regimen. Will monitor and make changes as necessary.  4. DM Fasting blood sugars are low (between 60-95) A1c 5.9; will change glipizide to 2.5 mg qam only  5. CHRONIC KIDNEY DISEASE STAGE I Will have staff encourage hydration; recheck BMP in 1 week to follow renal function  6. HYPERLIPIDEMIA Patient is stable; continue current regimen. Will monitor and make changes as necessary.  7. ANEMIA hgb remains stable  8. Loss of weight pts weight has remained stable despite GI complaints has gained 1 lbs may be related to ongoing doxycyline-- this has been dc'd  9. Memory loss Stable; will cont Aricept   10. AAA Will have staff make follow up for  pt at [facility] with Dr Myra Gianotti  11. Thyroid nodule 2 cm right thyroid nodule will get Endocrine referral at this time  Greater than 60 mins Time TOTAL:  time greater than 50% of total time spent doing pt counseled and coordination of care regarding plan of care

## 2013-04-09 ENCOUNTER — Other Ambulatory Visit: Payer: Medicare Other

## 2013-04-16 ENCOUNTER — Ambulatory Visit: Payer: Medicare Other | Admitting: Internal Medicine

## 2013-04-29 ENCOUNTER — Non-Acute Institutional Stay (SKILLED_NURSING_FACILITY): Payer: Medicare Other | Admitting: Nurse Practitioner

## 2013-04-29 ENCOUNTER — Encounter: Payer: Self-pay | Admitting: Nurse Practitioner

## 2013-04-29 DIAGNOSIS — J9601 Acute respiratory failure with hypoxia: Secondary | ICD-10-CM

## 2013-04-29 DIAGNOSIS — R634 Abnormal weight loss: Secondary | ICD-10-CM

## 2013-04-29 DIAGNOSIS — D649 Anemia, unspecified: Secondary | ICD-10-CM

## 2013-04-29 DIAGNOSIS — E785 Hyperlipidemia, unspecified: Secondary | ICD-10-CM

## 2013-04-29 DIAGNOSIS — J9600 Acute respiratory failure, unspecified whether with hypoxia or hypercapnia: Secondary | ICD-10-CM

## 2013-04-29 DIAGNOSIS — I10 Essential (primary) hypertension: Secondary | ICD-10-CM

## 2013-04-29 DIAGNOSIS — F4323 Adjustment disorder with mixed anxiety and depressed mood: Secondary | ICD-10-CM

## 2013-04-29 DIAGNOSIS — R413 Other amnesia: Secondary | ICD-10-CM

## 2013-04-29 MED ORDER — GLIPIZIDE 5 MG PO TABS: 2.5000 mg | ORAL_TABLET | Freq: Every day | ORAL | Status: DC

## 2013-04-29 MED ORDER — CITALOPRAM HYDROBROMIDE 20 MG PO TABS: 20.0000 mg | ORAL_TABLET | Freq: Every day | ORAL | Status: DC

## 2013-04-29 MED ORDER — MEMANTINE HCL 10 MG PO TABS: 10.0000 mg | ORAL_TABLET | Freq: Two times a day (BID) | ORAL | Status: DC

## 2013-04-29 NOTE — Progress Notes (Signed)
Patient ID: [name], male   DOB: 04-28-1928, 79 y.o.   MRN: [id number] Nursing Home Location:  [facility]   Place of Service: SNF (31)  PCP: [name], MD  Allergies  Allergen Reactions  . Diovan [Valsartan]     Chief Complaint  Patient presents with  . Medical Managment of Chronic Issues    HPI:  [name] is 79 yo male who is now at [facility] for STR after hospitalization for PNE before returning home. [name] with gait instability and therapy questions safety for [name] to return home alone; Currently [name] reports he is doing well except when he gets agitate he gets short of breath (therapy also notes this).  Staff without concerns at this time. [name] is to participate in at least 2 more weeks of thearpy  Review of Systems:  Review of Systems  Constitutional: Negative for fever, chills and diaphoresis.  HENT: Negative for congestion and sore throat.   Respiratory: Positive for shortness of breath (occasionally). Negative for cough and sputum production.   Cardiovascular: Negative for chest pain.  Gastrointestinal: Negative for heartburn, diarrhea and constipation.  Genitourinary: Negative for dysuria.  Musculoskeletal: Negative for joint pain and myalgias.  Skin: Negative.   Neurological: Negative for dizziness and headaches.  Psychiatric/Behavioral: Negative for depression. The patient is nervous/anxious.      Past Medical History  Diagnosis Date  . Arthritis   . Depression   . Diabetes mellitus   . Hypertension   . Hyperlipidemia   . Myocardial infarction 1984  . Colon cancer   . AAA (abdominal aortic aneurysm) 2011  . CAD (coronary artery disease) 2003  . Chronic kidney disease   . Anemia   . Diverticula of colon 2013  . Hypertrophy of prostate without urinary obstruction and other lower urinary tract symptoms (LUTS) 2008  . Hyperpotassemia 2008  . Gout, unspecified 2008  . Impacted cerumen 06/20/2006  . First degree atrioventricular block 2008  .  Abnormality of gait 2007  . Other specified cardiac dysrhythmias(427.89) 2005  . Unspecified disorder of kidney and ureter 2005  . Edema 2005  . Malignant neoplasm of colon, unspecified site 2004  . Unspecified hereditary and idiopathic peripheral neuropathy 2004  . Chest pain, unspecified 2003  . Pain in joint, pelvic region and thigh 2004  . Pain in limb 2003   Past Surgical History  Procedure Laterality Date  . Mediansternotomy    . Right colectomy  2004  . Coronary artery bypass graft  1996    Bartle, MD  . Hernia repair Right 1952    In [employer]   Social History:   reports that he quit smoking about 30 years ago. His smoking use included Cigarettes. He smoked 0.00 packs per day. He has never used smokeless tobacco. He reports that he drinks alcohol. He reports that he does not use illicit drugs.  Family History  Problem Relation Age of Onset  . Heart disease Father   . Pancreatic cancer Sister   . Cancer Sister     liver  . Cancer Brother     Leukemia  . Cancer Mother     ?    Medications: Patient's Medications  New Prescriptions   No medications on file  Previous Medications   ASPIRIN 81 MG TABLET    Take 81 mg by mouth daily. Take 1 tablet daily to prevent stroke and heart attack.   BLOOD GLUCOSE MONITORING SUPPL (BLOOD GLUCOSE METER) KIT    Use  as instructed   DONEPEZIL (ARICEPT) 10 MG TABLET    Take 10 mg by mouth at bedtime.   GLIPIZIDE (GLUCOTROL) 2.5 MG TABLET    Take 2.5 mg daily by mouth   HYDRALAZINE (APRESOLINE) 25 MG TABLET    Take 1 tablet (25 mg total) by mouth 3 (three) times daily.   LANCETS MISC    Use to check glucose   LISINOPRIL (PRINIVIL,ZESTRIL) 20 MG TABLET    Take 1 tablet (20 mg total) by mouth daily.   MIRTAZAPINE (REMERON) 7.5 MG TABLET    Take 1 tablet (7.5 mg total) by mouth at bedtime.   SIMVASTATIN (ZOCOR) 40 MG TABLET    Take 40 mg by mouth at bedtime. Take 1 tablet once daily to lower cholesterol.  Modified Medications   No  medications on file  Discontinued Medications   DONEPEZIL (ARICEPT) 5 MG TABLET    Take 2 tablets (10 mg total) by mouth at bedtime.   DOXYCYCLINE (VIBRA-TABS) 100 MG TABLET    Take 1 tablet (100 mg total) by mouth every 12 (twelve) hours.     Physical Exam: Physical Exam  Constitutional: He is well-developed, well-nourished, and in no distress. No distress.  HENT:  Head: Normocephalic and atraumatic.  Right Ear: External ear normal.  Left Ear: External ear normal.  Nose: Nose normal.  Mouth/Throat: Oropharynx is clear and moist. No oropharyngeal exudate.  Eyes: Conjunctivae and EOM are normal. Pupils are equal, round, and reactive to light.  Neck: Normal range of motion. Neck supple.  Cardiovascular: Normal rate, regular rhythm and normal heart sounds.   Pulmonary/Chest: Effort normal and breath sounds normal. No respiratory distress.  Abdominal: Soft. Normal appearance and bowel sounds are normal. He exhibits no distension. There is no tenderness.  Musculoskeletal: Normal range of motion. He exhibits no edema and no tenderness.  Lymphadenopathy:    He has no cervical adenopathy.  Neurological: He is alert.  Skin: Skin is warm and dry. He is not diaphoretic.  Psychiatric: He is agitated. He exhibits disordered thought content.     Filed Vitals:   04/29/13 1139  BP: 149/68  Pulse: 66  Temp: 97.9 F (36.6 C)  Resp: 20      Labs reviewed: Basic Metabolic Panel:  Recent Labs  11/91/47 1720 03/05/13 1050 03/06/13 0450 03/09/13 0828  NA 140 142 140 141  K 4.4 4.1 3.9 3.9  CL 103 106 107 107  CO2 25 24 22 25   GLUCOSE 169* 121* 81 104*  BUN 24* 25* 26* 20  CREATININE 1.87* 1.77* 1.80* 1.75*  CALCIUM 9.6 9.3 8.8 8.8  MG  --  1.9  --   --    Liver Function Tests:  Recent Labs  03/04/13 1720 03/06/13 0450  AST 20 19  ALT 10 9  ALKPHOS 75 57  BILITOT 0.4 0.3  PROT 6.7 5.8*  ALBUMIN 3.5 2.8*   No results found for this basename: LIPASE, AMYLASE,  in the  last 8760 hours  Recent Labs  03/06/13 0450  AMMONIA 18   CBC:  Recent Labs  11/20/12 1618 03/04/13 1720 03/05/13 0230 03/06/13 0450  WBC 5.3 13.4* 8.5 6.5  NEUTROABS 3.6 12.4*  --   --   HGB 12.6 14.0 13.5 12.6*  HCT 36.7* 40.1 38.7* 36.3*  MCV 91 90.3 90.0 90.5  PLT  --  160 131* 132*   Cardiac Enzymes:  Recent Labs  03/04/13 1720 03/04/13 2122 03/05/13 0230 03/05/13 1050 03/06/13 0450  CKTOTAL 407*  --   --  529* 281*  TROPONINI <0.30 <0.30 <0.30 <0.30  --    BNP: No components found with this basename: POCBNP,  CBG:  Recent Labs  03/10/13 0813 03/10/13 1235 03/10/13 1712  GLUCAP 106* 100* 104*   TSH:  Recent Labs  11/20/12 1618 03/05/13 1050 03/08/13 1130  TSH 1.880 0.957 1.707   A1C: Lab Results  Component Value Date   HGBA1C 5.9* 03/05/2013   Lipid Panel:  Recent Labs  03/06/13 0450  CHOL 116  HDL 37*  LDLCALC 57  TRIG 161  CHOLHDL 3.1   CBC NO Diff (Complete Blood Count)    Result: 04/23/2013 5:32 PM   ( Status: F )       WBC 5.2     4.0-10.5 K/uL SLN   RBC 3.48   L 4.22-5.81 MIL/uL SLN   Hemoglobin 10.5   L 13.0-17.0 g/dL SLN   Hematocrit 09.6   L 39.0-52.0 % SLN   MCV 89.9     78.0-100.0 fL SLN   MCH 30.2     26.0-34.0 pg SLN   MCHC 33.5     30.0-36.0 g/dL SLN   RDW 04.5     40.9-81.1 % SLN   Platelet Count 168     150-400 K/uL SLN   Comprehensive Metabolic Panel    Result: 04/23/2013 3:53 PM   ( Status: F )       Sodium 142     135-145 mEq/L SLN   Potassium 4.1     3.5-5.3 mEq/L SLN   Chloride 110     96-112 mEq/L SLN   CO2 23     19-32 mEq/L SLN   Glucose 94     70-99 mg/dL SLN   BUN 24   H 9-14 mg/dL SLN   Creatinine 7.82   H 0.50-1.35 mg/dL SLN   Bilirubin, Total 0.3     0.3-1.2 mg/dL SLN   Alkaline Phosphatase 55     39-117 U/L SLN   AST/SGOT 16     0-37 U/L SLN   ALT/SGPT 14     0-53 U/L SLN   Total Protein 5.0   L 6.0-8.3 g/dL SLN   Albumin 3.2   L 9.5-6.2 g/dL SLN   Calcium 8.5     1.3-08.6 mg/dL SLN    Basic Metabolic Panel    Result: 04/23/2013 3:53 PM   ( Status: F )       Sodium 142     135-145 mEq/L SLN   Potassium 4.1     3.5-5.3 mEq/L SLN   Chloride 110     96-112 mEq/L SLN   CO2 23     19-32 mEq/L SLN   Glucose 94     70-99 mg/dL SLN   BUN 24   H 5-78 mg/dL SLN   Creatinine 4.69   H 0.50-1.35 mg/dL SLN   Calcium 8.5     6.2-95.2 mg/dL SLN   Lipid Profile    Result: 04/23/2013 3:53 PM   ( Status: F )       Cholesterol 107     0-200 mg/dL SLN C Triglyceride 68     <150 mg/dL SLN   HDL Cholesterol 35   L >39 mg/dL SLN   Total Chol/HDL Ratio 3.1      Ratio SLN   VLDL Cholesterol (Calc) 14     0-40 mg/dL SLN   LDL Cholesterol (Calc) 58     0-99 mg/dL SLN C Liver Profile    Result:  04/23/2013 3:53 PM   ( Status: F )       Bilirubin, Total 0.3     0.3-1.2 mg/dL SLN   Bilirubin, Direct 0.1     0.0-0.3 mg/dL SLN   Indirect Bilirubin 0.2     0.0-0.9 mg/dL SLN   Alkaline Phosphatase 55     39-117 U/L SLN   AST/SGOT 16     0-37 U/L SLN   ALT/SGPT 14     0-53 U/L SLN   Total Protein 5.0   L 6.0-8.3 g/dL SLN   Albumin 3.2   Assessment/Plan 1. Loss of weight -Weights have fluctuated; pts weights currently stable;  minimal weight gain -diet has been liberalized  -cont supplements per RD  2. Memory loss On aricept 10 mg daily; will start namenda titration at this time to help preserve of memory  3. ANEMIA Cbc remains stable  4. Adjustment disorder with mixed anxiety and depressed mood Increase in anxiety and agitation at times; will start [name] on celexa 10 mg daily to help with mood  5. Acute respiratory failure with hypoxia Resolved; Reports increase shortness of breath with activity; thearpy reports they do not see this UNLESS he gets anxious; O2 being recording and no episodes noted  6. HYPERTENSION Stable on current medications  7. Diabetes Patient is stable; glipizide reduced last month ; CBGs fasting reviewed and have improved; no lows; will continue current regimen.  Will monitor and make changes as necessary.  8.hyperlipidia Stable on current medications

## 2013-05-07 ENCOUNTER — Encounter: Payer: Self-pay | Admitting: Internal Medicine

## 2013-05-07 ENCOUNTER — Non-Acute Institutional Stay (SKILLED_NURSING_FACILITY): Payer: Medicare Other | Admitting: Internal Medicine

## 2013-05-07 ENCOUNTER — Other Ambulatory Visit: Payer: Self-pay

## 2013-05-07 DIAGNOSIS — Z9889 Other specified postprocedural states: Secondary | ICD-10-CM

## 2013-05-07 DIAGNOSIS — Z9049 Acquired absence of other specified parts of digestive tract: Secondary | ICD-10-CM

## 2013-05-07 DIAGNOSIS — E1149 Type 2 diabetes mellitus with other diabetic neurological complication: Secondary | ICD-10-CM

## 2013-05-07 DIAGNOSIS — F4323 Adjustment disorder with mixed anxiety and depressed mood: Secondary | ICD-10-CM

## 2013-05-07 DIAGNOSIS — E785 Hyperlipidemia, unspecified: Secondary | ICD-10-CM

## 2013-05-07 DIAGNOSIS — I10 Essential (primary) hypertension: Secondary | ICD-10-CM

## 2013-05-07 DIAGNOSIS — F0390 Unspecified dementia without behavioral disturbance: Secondary | ICD-10-CM

## 2013-05-07 DIAGNOSIS — N181 Chronic kidney disease, stage 1: Secondary | ICD-10-CM

## 2013-05-07 DIAGNOSIS — I714 Abdominal aortic aneurysm, without rupture: Secondary | ICD-10-CM

## 2013-05-07 MED ORDER — ALPRAZOLAM 0.25 MG PO TABS: 0.2500 mg | ORAL_TABLET | Freq: Two times a day (BID) | ORAL | Status: DC | PRN

## 2013-05-10 ENCOUNTER — Encounter: Payer: Self-pay | Admitting: Internal Medicine

## 2013-05-10 NOTE — Progress Notes (Signed)
MRN: [id number] Name: [name]  Sex: male Age: 79 y.o. DOB: 1928-01-01  PSC #: [facility] Facility/Room: 107 Level Of Care: SNF Provider: Merrilee Seashore D Emergency Contacts: Extended Emergency Contact Information Primary Emergency Contact: Deatherage,Vera Address: [address]          [place] [address] [place] Home Phone: [phone] Relation: Sister Secondary Emergency Contact: Deatherage,Ralph  [place] Mobile Phone: [phone] Relation: Other    Allergies: Diovan  Chief Complaint  Patient presents with  . Discharge Note    HPI: Patient is 79 y.o. male who was admitted to SNF after having severe PNA and weakness who is ready to be discharged to home.  Past Medical History  Diagnosis Date  . Arthritis   . Depression   . Diabetes mellitus   . Hypertension   . Hyperlipidemia   . Myocardial infarction 1984  . Colon cancer   . AAA (abdominal aortic aneurysm) 2011  . CAD (coronary artery disease) 2003  . Chronic kidney disease   . Anemia   . Diverticula of colon 2013  . Hypertrophy of prostate without urinary obstruction and other lower urinary tract symptoms (LUTS) 2008  . Hyperpotassemia 2008  . Gout, unspecified 2008  . Impacted cerumen 06/20/2006  . First degree atrioventricular block 2008  . Abnormality of gait 2007  . Other specified cardiac dysrhythmias(427.89) 2005  . Unspecified disorder of kidney and ureter 2005  . Edema 2005  . Malignant neoplasm of colon, unspecified site 2004  . Unspecified hereditary and idiopathic peripheral neuropathy 2004  . Chest pain, unspecified 2003  . Pain in joint, pelvic region and thigh 2004  . Pain in limb 2003    Past Surgical History  Procedure Laterality Date  . Mediansternotomy    . Right colectomy  2004  . Coronary artery bypass graft  1996    Bartle, MD  . Hernia repair Right 1952    In [employer]      Medication List       This list is accurate as of:  05/07/13 11:59 PM.  Always use your most recent med list.               ALPRAZolam 0.25 MG tablet  Commonly known as:  XANAX  Take 1 tablet (0.25 mg total) by mouth 2 (two) times daily as needed for anxiety.     ARICEPT 10 MG tablet  Generic drug:  donepezil  Take 10 mg by mouth at bedtime.     aspirin 81 MG tablet  Take 81 mg by mouth daily. Take 1 tablet daily to prevent stroke and heart attack.     Blood Glucose Meter kit  Use as instructed     citalopram 20 MG tablet  Commonly known as:  CELEXA  Take 1 tablet (20 mg total) by mouth daily.     glipiZIDE 5 MG tablet  Commonly known as:  GLUCOTROL  Take 0.5 tablets (2.5 mg total) by mouth daily before breakfast.     hydrALAZINE 25 MG tablet  Commonly known as:  APRESOLINE  Take 75 mg by mouth 3 (three) times daily.     Lancets Misc  Use to check glucose     lisinopril 20 MG tablet  Commonly known as:  PRINIVIL,ZESTRIL  Take 1 tablet (20 mg total) by mouth daily.     memantine 10 MG tablet  Commonly known as:  NAMENDA  Take 1 tablet (10 mg total) by mouth 2 (two)  times daily.     mirtazapine 7.5 MG tablet  Commonly known as:  REMERON  Take 1 tablet (7.5 mg total) by mouth at bedtime.     simvastatin 40 MG tablet  Commonly known as:  ZOCOR  Take 40 mg by mouth at bedtime. Take 1 tablet once daily to lower cholesterol.        Meds ordered this encounter  Medications  . DISCONTD: ALPRAZolam (XANAX) 0.25 MG tablet    Sig: Take 0.25 mg by mouth 2 (two) times daily as needed for anxiety.  . hydrALAZINE (APRESOLINE) 25 MG tablet    Sig: Take 75 mg by mouth 3 (three) times daily.    Immunization History  Administered Date(s) Administered  . Influenza,inj,Quad PF,36+ Mos 03/07/2013  . Pneumococcal Conjugate-13 06/13/1999  . Pneumococcal Polysaccharide-23 03/08/2013  . Td 06/12/1993    History  Substance Use Topics  . Smoking status: Former Smoker    Types: Cigarettes    Quit date: 06/12/1982  .  Smokeless tobacco: Never Used  . Alcohol Use: Yes    Filed Vitals:   05/07/13 1420  BP: 122/61  Pulse: 77  Temp: 98.1 F (36.7 C)  Resp: 18    Physical Exam  GENERAL APPEARANCE: Alert, conversant. Appropriately groomed. No acute distress.  HEENT: Unremarkable. RESPIRATORY: Breathing is even, unlabored. Lung sounds are clear   CARDIOVASCULAR: Heart RRR no murmurs, rubs or gallops. No peripheral edema.  GASTROINTESTINAL: Abdomen is soft, non-tender, not distended w/ normal bowel sounds.  NEUROLOGIC: Cranial nerves 2-12 grossly intact. Moves all extremities no tremor.  Patient Active Problem List   Diagnosis Date Noted  . Acute respiratory failure with hypoxia 03/04/2013  . PNA (pneumonia) 03/04/2013  . Leukocytosis 03/04/2013  . Memory loss 02/01/2013  . Colon cancer 02/01/2013  . Onychogryphosis 01/15/2013  . Insomnia, unspecified 12/18/2012  . Loss of weight 11/28/2012  . Adjustment disorder with mixed anxiety and depressed mood 11/28/2012  . Dementia without behavioral disturbance 09/09/2012  . S/P right colectomy 06/23/2011  . Diverticulosis of colon (without mention of hemorrhage) 06/23/2011  . Abdominal aneurysm without mention of rupture 05/29/2011  . Type II or unspecified type diabetes mellitus with neurological manifestations, not stated as uncontrolled(250.60) 09/26/2007  . HYPERLIPIDEMIA 09/26/2007  . ANEMIA 09/26/2007  . PERIPHERAL NEUROPATHY 09/26/2007  . HYPERTENSION 09/26/2007  . COR ATHEROSLERO UNSPEC TYPE VESSEL NATIVE/GRAFT 09/26/2007  . BRADYCARDIA 09/26/2007  . CHRONIC KIDNEY DISEASE STAGE I 09/26/2007  . ORGANIC IMPOTENCE 09/26/2007  . DEGENERATIVE JOINT DISEASE 09/26/2007  . ABNORMALITY OF GAIT 09/26/2007  . NEOPLASM, MALIGNANT, COLO-RECTAL, HX OF 09/26/2007    CBC    Component Value Date/Time   WBC 6.5 03/06/2013 0450   WBC 5.3 11/20/2012 1618   WBC 6.3 06/28/2007 1132   RBC 4.01* 03/06/2013 0450   RBC 4.05* 11/20/2012 1618   RBC 4.62  06/28/2007 1132   HGB 12.6* 03/06/2013 0450   HGB 14.4 06/28/2007 1132   HCT 36.3* 03/06/2013 0450   HCT 42.0 06/28/2007 1132   PLT 132* 03/06/2013 0450   PLT 164 06/28/2007 1132   MCV 90.5 03/06/2013 0450   MCV 90.8 06/28/2007 1132   LYMPHSABS 0.4* 03/04/2013 1720   LYMPHSABS 1.3 11/20/2012 1618   LYMPHSABS 1.5 06/28/2007 1132   MONOABS 0.6 03/04/2013 1720   MONOABS 0.3 06/28/2007 1132   EOSABS 0.0 03/04/2013 1720   EOSABS 0.1 11/20/2012 1618   BASOSABS 0.0 03/04/2013 1720   BASOSABS 0.0 11/20/2012 1618   BASOSABS 0.0 06/28/2007 1132  CMP     Component Value Date/Time   NA 141 03/09/2013 0828   NA 142 11/20/2012 1618   K 3.9 03/09/2013 0828   CL 107 03/09/2013 0828   CO2 25 03/09/2013 0828   GLUCOSE 104* 03/09/2013 0828   GLUCOSE 150* 11/20/2012 1618   BUN 20 03/09/2013 0828   BUN 30* 11/20/2012 1618   CREATININE 1.75* 03/09/2013 0828   CREATININE 1.90* 05/29/2011 1040   CALCIUM 8.8 03/09/2013 0828   PROT 5.8* 03/06/2013 0450   ALBUMIN 2.8* 03/06/2013 0450   AST 19 03/06/2013 0450   ALT 9 03/06/2013 0450   ALKPHOS 57 03/06/2013 0450   BILITOT 0.3 03/06/2013 0450   GFRNONAA 34* 03/09/2013 0828   GFRAA 40* 03/09/2013 0828    Assessment and Plan  Patient is being discharged to home in stable and improved condition with OT/PT.  Margit Hanks, MD

## 2013-05-11 ENCOUNTER — Other Ambulatory Visit: Payer: Self-pay | Admitting: Nurse Practitioner

## 2013-05-14 ENCOUNTER — Inpatient Hospital Stay (HOSPITAL_COMMUNITY)
Admission: EM | Admit: 2013-05-14 | Discharge: 2013-05-17 | DRG: 308 | Disposition: A | Discharge status: Home Health | Payer: Medicare Other | Attending: Internal Medicine | Admitting: Internal Medicine

## 2013-05-14 ENCOUNTER — Emergency Department (HOSPITAL_COMMUNITY): Payer: Medicare Other

## 2013-05-14 ENCOUNTER — Encounter (HOSPITAL_COMMUNITY): Payer: Self-pay | Admitting: Internal Medicine

## 2013-05-14 DIAGNOSIS — IMO0002 Reserved for concepts with insufficient information to code with codable children: Secondary | ICD-10-CM

## 2013-05-14 DIAGNOSIS — R269 Unspecified abnormalities of gait and mobility: Secondary | ICD-10-CM

## 2013-05-14 DIAGNOSIS — Z85038 Personal history of other malignant neoplasm of large intestine: Secondary | ICD-10-CM

## 2013-05-14 DIAGNOSIS — Z87891 Personal history of nicotine dependence: Secondary | ICD-10-CM

## 2013-05-14 DIAGNOSIS — I714 Abdominal aortic aneurysm, without rupture, unspecified: Secondary | ICD-10-CM | POA: Diagnosis present

## 2013-05-14 DIAGNOSIS — I440 Atrioventricular block, first degree: Secondary | ICD-10-CM | POA: Diagnosis present

## 2013-05-14 DIAGNOSIS — G4700 Insomnia, unspecified: Secondary | ICD-10-CM

## 2013-05-14 DIAGNOSIS — K5730 Diverticulosis of large intestine without perforation or abscess without bleeding: Secondary | ICD-10-CM

## 2013-05-14 DIAGNOSIS — M129 Arthropathy, unspecified: Secondary | ICD-10-CM | POA: Diagnosis present

## 2013-05-14 DIAGNOSIS — F0390 Unspecified dementia without behavioral disturbance: Secondary | ICD-10-CM | POA: Diagnosis present

## 2013-05-14 DIAGNOSIS — Z951 Presence of aortocoronary bypass graft: Secondary | ICD-10-CM

## 2013-05-14 DIAGNOSIS — E1149 Type 2 diabetes mellitus with other diabetic neurological complication: Secondary | ICD-10-CM

## 2013-05-14 DIAGNOSIS — R413 Other amnesia: Secondary | ICD-10-CM

## 2013-05-14 DIAGNOSIS — D72829 Elevated white blood cell count, unspecified: Secondary | ICD-10-CM

## 2013-05-14 DIAGNOSIS — I252 Old myocardial infarction: Secondary | ICD-10-CM

## 2013-05-14 DIAGNOSIS — F4323 Adjustment disorder with mixed anxiety and depressed mood: Secondary | ICD-10-CM

## 2013-05-14 DIAGNOSIS — N400 Enlarged prostate without lower urinary tract symptoms: Secondary | ICD-10-CM | POA: Diagnosis present

## 2013-05-14 DIAGNOSIS — R001 Bradycardia, unspecified: Secondary | ICD-10-CM | POA: Diagnosis present

## 2013-05-14 DIAGNOSIS — F329 Major depressive disorder, single episode, unspecified: Secondary | ICD-10-CM | POA: Diagnosis present

## 2013-05-14 DIAGNOSIS — E785 Hyperlipidemia, unspecified: Secondary | ICD-10-CM | POA: Diagnosis present

## 2013-05-14 DIAGNOSIS — I2510 Atherosclerotic heart disease of native coronary artery without angina pectoris: Secondary | ICD-10-CM | POA: Diagnosis present

## 2013-05-14 DIAGNOSIS — L602 Onychogryphosis: Secondary | ICD-10-CM

## 2013-05-14 DIAGNOSIS — Z7982 Long term (current) use of aspirin: Secondary | ICD-10-CM

## 2013-05-14 DIAGNOSIS — N529 Male erectile dysfunction, unspecified: Secondary | ICD-10-CM

## 2013-05-14 DIAGNOSIS — N183 Chronic kidney disease, stage 3 unspecified: Secondary | ICD-10-CM | POA: Diagnosis present

## 2013-05-14 DIAGNOSIS — D649 Anemia, unspecified: Secondary | ICD-10-CM

## 2013-05-14 DIAGNOSIS — N181 Chronic kidney disease, stage 1: Secondary | ICD-10-CM

## 2013-05-14 DIAGNOSIS — I498 Other specified cardiac arrhythmias: Principal | ICD-10-CM

## 2013-05-14 DIAGNOSIS — I129 Hypertensive chronic kidney disease with stage 1 through stage 4 chronic kidney disease, or unspecified chronic kidney disease: Secondary | ICD-10-CM | POA: Diagnosis present

## 2013-05-14 DIAGNOSIS — Z9049 Acquired absence of other specified parts of digestive tract: Secondary | ICD-10-CM

## 2013-05-14 DIAGNOSIS — G609 Hereditary and idiopathic neuropathy, unspecified: Secondary | ICD-10-CM | POA: Diagnosis present

## 2013-05-14 DIAGNOSIS — I10 Essential (primary) hypertension: Secondary | ICD-10-CM

## 2013-05-14 DIAGNOSIS — M1990 Unspecified osteoarthritis, unspecified site: Secondary | ICD-10-CM

## 2013-05-14 DIAGNOSIS — J9601 Acute respiratory failure with hypoxia: Secondary | ICD-10-CM

## 2013-05-14 DIAGNOSIS — C189 Malignant neoplasm of colon, unspecified: Secondary | ICD-10-CM

## 2013-05-14 DIAGNOSIS — E1142 Type 2 diabetes mellitus with diabetic polyneuropathy: Secondary | ICD-10-CM | POA: Diagnosis present

## 2013-05-14 DIAGNOSIS — R634 Abnormal weight loss: Secondary | ICD-10-CM | POA: Diagnosis present

## 2013-05-14 DIAGNOSIS — F3289 Other specified depressive episodes: Secondary | ICD-10-CM | POA: Diagnosis present

## 2013-05-14 DIAGNOSIS — E43 Unspecified severe protein-calorie malnutrition: Secondary | ICD-10-CM | POA: Diagnosis present

## 2013-05-14 DIAGNOSIS — J189 Pneumonia, unspecified organism: Secondary | ICD-10-CM

## 2013-05-14 DIAGNOSIS — R42 Dizziness and giddiness: Secondary | ICD-10-CM

## 2013-05-14 HISTORY — DX: Chronic kidney disease, stage 3 unspecified: N18.30

## 2013-05-14 HISTORY — DX: Chronic kidney disease, stage 3 (moderate): N18.3

## 2013-05-14 HISTORY — DX: Dizziness and giddiness: R42

## 2013-05-14 HISTORY — DX: Shortness of breath: R06.02

## 2013-05-14 HISTORY — DX: Pneumonia, unspecified organism: J18.9

## 2013-05-14 HISTORY — DX: Anxiety disorder, unspecified: F41.9

## 2013-05-14 LAB — COMPREHENSIVE METABOLIC PANEL
AST: 18 U/L (ref 0–37)
Albumin: 3.3 g/dL — ABNORMAL LOW (ref 3.5–5.2)
BUN: 31 mg/dL — ABNORMAL HIGH (ref 6–23)
Chloride: 108 mEq/L (ref 96–112)
Creatinine, Ser: 1.77 mg/dL — ABNORMAL HIGH (ref 0.50–1.35)
GFR calc non Af Amer: 34 mL/min — ABNORMAL LOW (ref >90)
Potassium: 4.5 mEq/L (ref 3.5–5.1)
Total Protein: 6.1 g/dL (ref 6.0–8.3)

## 2013-05-14 LAB — CBC
HCT: 33.4 % — ABNORMAL LOW (ref 39.0–52.0)
Hemoglobin: 11.3 g/dL — ABNORMAL LOW (ref 13.0–17.0)
MCHC: 33.8 g/dL (ref 30.0–36.0)
Platelets: 181 10*3/uL (ref 150–400)
RDW: 15 % (ref 11.5–15.5)
WBC: 5.7 10*3/uL (ref 4.0–10.5)

## 2013-05-14 LAB — URINALYSIS, ROUTINE W REFLEX MICROSCOPIC
Glucose, UA: NEGATIVE mg/dL
Hgb urine dipstick: NEGATIVE
Leukocytes, UA: NEGATIVE
Nitrite: NEGATIVE
Protein, ur: 30 mg/dL — AB
pH: 5 (ref 5.0–8.0)

## 2013-05-14 LAB — TROPONIN I: Troponin I: <0.3 ng/mL (ref <0.30)

## 2013-05-14 LAB — URINE MICROSCOPIC-ADD ON

## 2013-05-14 MED ORDER — HYDRALAZINE HCL 50 MG PO TABS
75.0000 mg | ORAL_TABLET | Freq: Three times a day (TID) | ORAL | Status: DC
  Administered 2013-05-14 – 2013-05-17 (×8): 75 mg via ORAL
  Filled 2013-05-14 (×10): qty 1

## 2013-05-14 MED ORDER — ONDANSETRON HCL 4 MG/2ML IJ SOLN: 4.0000 mg | Freq: Four times a day (QID) | INTRAMUSCULAR | Status: DC | PRN

## 2013-05-14 MED ORDER — SIMVASTATIN 40 MG PO TABS
40.0000 mg | ORAL_TABLET | Freq: Every day | ORAL | Status: DC
  Administered 2013-05-14 – 2013-05-16 (×3): 40 mg via ORAL
  Filled 2013-05-14 (×4): qty 1

## 2013-05-14 MED ORDER — HEPARIN SODIUM (PORCINE) 5000 UNIT/ML IJ SOLN
5000.0000 [IU] | Freq: Three times a day (TID) | INTRAMUSCULAR | Status: DC
  Administered 2013-05-14 – 2013-05-17 (×8): 5000 [IU] via SUBCUTANEOUS
  Filled 2013-05-14 (×11): qty 1

## 2013-05-14 MED ORDER — ASPIRIN EC 81 MG PO TBEC
81.0000 mg | DELAYED_RELEASE_TABLET | Freq: Every day | ORAL | Status: DC
  Administered 2013-05-14 – 2013-05-17 (×4): 81 mg via ORAL
  Filled 2013-05-14 (×4): qty 1

## 2013-05-14 MED ORDER — ONDANSETRON HCL 4 MG PO TABS: 4.0000 mg | ORAL_TABLET | Freq: Four times a day (QID) | ORAL | Status: DC | PRN

## 2013-05-14 MED ORDER — LISINOPRIL 20 MG PO TABS
20.0000 mg | ORAL_TABLET | Freq: Every day | ORAL | Status: DC
  Administered 2013-05-15 – 2013-05-17 (×3): 20 mg via ORAL
  Filled 2013-05-14 (×3): qty 1

## 2013-05-14 MED ORDER — MEMANTINE HCL 10 MG PO TABS
10.0000 mg | ORAL_TABLET | Freq: Two times a day (BID) | ORAL | Status: DC
  Administered 2013-05-15 – 2013-05-17 (×6): 10 mg via ORAL
  Filled 2013-05-14 (×7): qty 1

## 2013-05-14 MED ORDER — MIRTAZAPINE 7.5 MG PO TABS
7.5000 mg | ORAL_TABLET | Freq: Every day | ORAL | Status: DC
  Administered 2013-05-14 – 2013-05-16 (×3): 7.5 mg via ORAL
  Filled 2013-05-14 (×4): qty 1

## 2013-05-14 MED ORDER — DONEPEZIL HCL 10 MG PO TABS
10.0000 mg | ORAL_TABLET | Freq: Every day | ORAL | Status: DC
  Administered 2013-05-14: 10 mg via ORAL
  Filled 2013-05-14 (×2): qty 1

## 2013-05-14 MED ORDER — MECLIZINE HCL 25 MG PO TABS
25.0000 mg | ORAL_TABLET | Freq: Once | ORAL | Status: AC
  Administered 2013-05-14: 25 mg via ORAL
  Filled 2013-05-14: qty 1

## 2013-05-14 MED ORDER — ALPRAZOLAM 0.25 MG PO TABS: 0.2500 mg | ORAL_TABLET | Freq: Two times a day (BID) | ORAL | Status: DC | PRN

## 2013-05-14 MED ORDER — SODIUM CHLORIDE 0.9 % IJ SOLN
3.0000 mL | Freq: Two times a day (BID) | INTRAMUSCULAR | Status: DC
  Administered 2013-05-14 – 2013-05-17 (×6): 3 mL via INTRAVENOUS

## 2013-05-14 MED ORDER — MECLIZINE HCL 25 MG PO TABS
25.0000 mg | ORAL_TABLET | Freq: Three times a day (TID) | ORAL | Status: DC | PRN
  Filled 2013-05-14: qty 1

## 2013-05-14 NOTE — ED Notes (Signed)
Pt stuck X 2 for blood by RN without success, phlebotomy paged twice.

## 2013-05-14 NOTE — ED Provider Notes (Signed)
CSN: [id number]     Arrival date & time 05/14/13  1402 History   First MD Initiated Contact with Patient 05/14/13 1404     Chief Complaint  Patient presents with  . Bradycardia  . Dizziness   (Consider location/radiation/quality/duration/timing/severity/associated sxs/prior Treatment) HPI Mr. [name] is a 79 y.o. male w/ PMHx of CAD, HTN. HLD, AAA (2011), Colon CA (s/p hemicolectomy), DM type II, CKD stage III, BPH and memory loss presents to the ED from home after his [facility] noticed his HR to be in the 30's and the patient was complaining of difficulty standing, 2/2 dizziness and lightheadedness. HHRN states that his HR is typically in the 60's, but today is was much slower in the 30'40's. Additionally, the patient claims he tried to get up and go to the bathroom when he stood up from bed and could not stay standing. He kept trying and falling back to his original seated position b/c of lightheadedness. His HHRN also states that the last day or so he has had a harder time walking d/t imbalance.  Patient was recently hospitalized at [facility] in 02/2013 after being found unresponsive at home and determine to have hypoxia most likely 2/2 pneumonia. Treated w/ Abx in the hospital.  Also had an abdominal US at that time, showing that AAA was 5.5 cm in diameter, but stable, patient w/ no abdominal pain.   Past Medical History  Diagnosis Date  . Arthritis   . Depression   . Diabetes mellitus   . Hypertension   . Hyperlipidemia   . Myocardial infarction 1984  . Colon cancer   . AAA (abdominal aortic aneurysm) 2011  . CAD (coronary artery disease) 2003  . Chronic kidney disease   . Anemia   . Diverticula of colon 2013  . Hypertrophy of prostate without urinary obstruction and other lower urinary tract symptoms (LUTS) 2008  . Hyperpotassemia 2008  . Gout, unspecified 2008  . Impacted cerumen 06/20/2006  . First degree atrioventricular block 2008  . Abnormality of gait 2007  . Other  specified cardiac dysrhythmias(427.89) 2005  . Unspecified disorder of kidney and ureter 2005  . Edema 2005  . Malignant neoplasm of colon, unspecified site 2004  . Unspecified hereditary and idiopathic peripheral neuropathy 2004  . Chest pain, unspecified 2003  . Pain in joint, pelvic region and thigh 2004  . Pain in limb 2003   Past Surgical History  Procedure Laterality Date  . Mediansternotomy    . Right colectomy  2004  . Coronary artery bypass graft  1996    Bartle, MD  . Hernia repair Right 1952    In [employer]   Family History  Problem Relation Age of Onset  . Heart disease Father   . Pancreatic cancer Sister   . Cancer Sister     liver  . Cancer Brother     Leukemia  . Cancer Mother     ?   History  Substance Use Topics  . Smoking status: Former Smoker    Types: Cigarettes    Quit date: 06/12/1982  . Smokeless tobacco: Never Used  . Alcohol Use: Yes    Review of Systems General: Positive for chills (chronic issue). Denies fever, diaphoresis, appetite change and fatigue.  Respiratory: Positive for DOE. Denies cough, chest tightness, and wheezing.   Cardiovascular: Denies chest pain, palpitations and leg swelling.  Gastrointestinal: Positive for intermittent diarrhea and constipation. Denies nausea, vomiting, abdominal pain, blood in stool and abdominal distention.  Genitourinary:  Positive for hesitancy and urgency. Denies dysuria, hematuria,or flank pain.  Endocrine: Denies hot or cold intolerance, sweats, polyuria, polydipsia. Musculoskeletal: Positive for gait problem. Denies myalgias, back pain, joint swelling, arthralgias. Skin: Denies pallor, rash and wounds.  Neurological: Positive for dizziness, lightheadedness, weakness. Denies seizures, syncope, numbness and headaches.  Psychiatric/Behavioral: Denies mood changes, confusion, nervousness, sleep disturbance and agitation.  Allergies  Diovan  Home Medications   Current Outpatient Rx  Name  Route  Sig   Dispense  Refill  . ALPRAZolam (XANAX) 0.25 MG tablet   Oral   Take 1 tablet (0.25 mg total) by mouth 2 (two) times daily as needed for anxiety.   60 tablet   5   . aspirin 81 MG tablet   Oral   Take 81 mg by mouth daily. Take 1 tablet daily to prevent stroke and heart attack.         . donepezil (ARICEPT) 10 MG tablet   Oral   Take 10 mg by mouth at bedtime.         . hydrALAZINE (APRESOLINE) 25 MG tablet   Oral   Take 75 mg by mouth 3 (three) times daily.         Marland Kitchen lisinopril (PRINIVIL,ZESTRIL) 20 MG tablet   Oral   Take 1 tablet (20 mg total) by mouth daily.   30 tablet   6   . memantine (NAMENDA) 10 MG tablet   Oral   Take 1 tablet (10 mg total) by mouth 2 (two) times daily.         . mirtazapine (REMERON) 7.5 MG tablet   Oral   Take 1 tablet (7.5 mg total) by mouth at bedtime.   30 tablet   3   . simvastatin (ZOCOR) 40 MG tablet   Oral   Take 40 mg by mouth at bedtime. Take 1 tablet once daily to lower cholesterol.          BP 184/60  Pulse 41  Temp(Src) 98.7 F (37.1 C) (Oral)  Resp 23  SpO2 99% Physical Exam Filed Vitals:   05/14/13 1420 05/14/13 1421 05/14/13 1653 05/14/13 1730  BP: 165/55 176/65 176/63 184/60  Pulse: 46 60  41  Temp:      TempSrc:      Resp:   18 23  SpO2:   99% 99%   General: Vital signs reviewed.  Patient is a well-developed and well-nourished, in no acute distress and cooperative with exam. Alert and oriented x3.  Head: Normocephalic and atraumatic. Eyes: PERRL, EOMI, conjunctivae slightly erythematous, R>L, No scleral icterus.  Neck: Supple, trachea midline, normal ROM, No JVD, masses, thyromegaly, or carotid bruit present.  Cardiovascular: RRR, S1 normal, S2 normal, no murmurs, gallops, or rubs. Midline scar from previous CABG Pulmonary/Chest: Normal respiratory effort, CTAB, no wheezes, rales, or rhonchi. Abdominal: Soft, non-tender, non-distended, bowel sounds are normal, central pulsating mass felt on exam (h/o  AAA), organomegaly, or guarding present.  Musculoskeletal: No joint deformities, erythema, or stiffness, ROM full and no nontender. Extremities: No swelling or edema,  pulses symmetric and intact bilaterally. No cyanosis or clubbing. Neurological: A&O x3, Strength is normal and symmetric bilaterally, cranial nerve II-XII are grossly intact, no focal motor deficit, sensory intact to light touch bilaterally.  Skin: Warm, dry and intact. No rashes or erythema. Psychiatric: Normal mood and affect. speech and behavior is normal. Cognition normal, memory slightly impaired.   ED Course  Procedures (including critical care time) Labs Review Labs Reviewed  CBC -  Abnormal; Notable for the following:    RBC 3.65 (*)    Hemoglobin 11.3 (*)    HCT 33.4 (*)    All other components within normal limits  COMPREHENSIVE METABOLIC PANEL - Abnormal; Notable for the following:    Glucose, Bld 105 (*)    BUN 31 (*)    Creatinine, Ser 1.77 (*)    Albumin 3.3 (*)    Total Bilirubin 0.2 (*)    GFR calc non Af Amer 34 (*)    GFR calc Af Amer 39 (*)    All other components within normal limits  URINALYSIS, ROUTINE W REFLEX MICROSCOPIC   Imaging Review Ct Abdomen Pelvis Wo Contrast  05/14/2013   CLINICAL DATA:  Abdominal pain, fall 2 weeks ago  EXAM: CT ABDOMEN AND PELVIS WITHOUT CONTRAST  TECHNIQUE: Multidetector CT imaging of the abdomen and pelvis was performed following the standard protocol without intravenous contrast.  COMPARISON:  12/11/2011  FINDINGS: Lung bases are unremarkable. Sagittal images of the spine shows significant disc space flattening with endplate sclerotic changes mild anterior and mild posterior spurring and vacuum disc phenomenon at L3-L4 level. There is disc space flattening with mild posterior spurring at L5-S1 level.  Atherosclerotic calcifications of abdominal aorta, celiac trunk origin, renal artery origin, splenic artery and iliac arteries are noted. SMA calcifications are noted.  Again noted aneurysmal dilatation of distal abdominal aorta measures 4.9 by 5 cm in diameter. On the prior exam measures 4.9 x 4.9 cm in diameter. No periaortic fluid or leak is noted.  Unenhanced liver shows no biliary ductal dilatation. No calcified gallstones are noted within gallbladder. Again noted status post partial right hemicolectomy. Unenhanced right kidney is unchanged in size in appearance. No right hydronephrosis. Adrenal glands are unremarkable. The spleen and pancreas are unremarkable.  Left kidney surgically absent.  No small bowel or colonic obstruction. Moderate stool noted in distal colon. Left colon and sigmoid colon diverticula are noted. No evidence of acute diverticulitis. Again noted atherosclerotic calcifications and aneurysmal dilatation of common iliac arteries. Right common iliac artery measures 2 cm in diameter. Left common iliac artery measures 1.7 cm in diameter. The urinary bladder is unremarkable. Prostate gland and seminal vesicles are unremarkable. No destructive bony lesions are noted within pelvis. Atherosclerotic calcifications of femoral arteries.  IMPRESSION: 1. Degenerative changes lumbar spine at L3-L4 and L5-S1 level. 2. Again noted infrarenal aneurysmal dilatation of abdominal aorta measures 5 x 4.9 cm on the prior exam measures 4.9 cm. 3. Stable in appearance unenhanced right kidney. No right hydronephrosis. The left kidney is surgically absent. 4. Again noted status post partial right hemicolectomy. No small bowel or colonic obstruction. No adenopathy. 5. Distal colonic diverticula without evidence of acute diverticulitis.   Electronically Signed   By: Natasha Mead M.D.   On: 05/14/2013 16:53   Dg Chest 2 View  05/14/2013   CLINICAL DATA:  Bradycardia.  Dizziness.  EXAM: CHEST  2 VIEW  COMPARISON:  03/06/2013  FINDINGS: Abnormal density overlies the left post for lateral rib fractures. Mildly tortuous thoracic aorta. Biapical pleural parenchymal scarring.  Prior CABG.   Mildly tortuous thoracic aorta.  IMPRESSION: 1. Abnormal density, left upper lobe, suspicious for lung cancer. There were clearly some infectious/ inflammatory densities on the prior chest CT from 03/06/2013, but I am highly suspicious for low grade adenocarcinoma in the left upper lobe given the persistence today. I recommend followup chest CT now with comparison to CT chest exams back through 2007 in order to  assess for growth.   Electronically Signed   By: Herbie Baltimore M.D.   On: 05/14/2013 16:21   Ct Head Wo Contrast  05/14/2013   CLINICAL DATA:  Dizziness.  EXAM: CT HEAD WITHOUT CONTRAST  TECHNIQUE: Contiguous axial images were obtained from the base of the skull through the vertex without intravenous contrast.  COMPARISON:  CT scan of March 04, 2013.  FINDINGS: Bony calvarium appears intact. Diffuse cortical atrophy is noted. Chronic ischemic white matter disease is noted. No mass effect or midline shift is noted. Ventricular size is within normal limits. There is no evidence of mass lesion, hemorrhage or acute infarction.  IMPRESSION: Mild diffuse cortical atrophy. Chronic ischemic white matter disease. No acute intracranial abnormality seen.   Electronically Signed   By: Roque Lias M.D.   On: 05/14/2013 16:42    EKG Interpretation    Date/Time:  Wednesday May 14 2013 14:10:00 EST Ventricular Rate:  43 PR Interval:  281 QRS Duration: 90 QT Interval:  464 QTC Calculation: 392 R Axis:   70 Text Interpretation:  Sinus bradycardia Prolonged PR interval Anterior infarct, old Confirmed by DELOS  MD, DOUGLAS ([id number]) on 05/14/2013 2:12:28 PM            MDM   Mr. [name] is a 79 y.o. male w/ PMHx of CAD, HTN. HLD, AAA (2011), Colon CA (in remission?), DM type II, CKD stage III, BPH and memory loss presents to the ED w/ bradycardia and dizziness. Bradycardia seems to be a new issue. Patient complaining of dizziness and lightheadedness when standing and turning of his  head. Consider posterior circulation infarct, BPPV, orthostasis/dehydration.  -EKG shows sinus bradycardia @ 43 bpm, w/ 1st degree AV block.  -CT head shows no acute intracranial abnormality.  -Patient w/ -ve orthostatics. SBP in 170's in ED. Patient and HHRN deny any decrease in PO intake or extra volume losses including vomiting, increased urinary frequency, or diarrhea.  -No signs of infection, CBC shows no leukocytosis. -CXR shows an abnormal density in the left upper lobe, suspicious for lung cancer. There were clearly some infectious/ inflammatory densities on the prior chest CT from 03/06/2013, but new film is highly suspicious for low grade adenocarcinoma in the left upper lobe given the persistence today. CT chest recommended.  -Given history of AAA w/ 5.5 cm diameter seen on [place], CT abdomen/pelvis performed, shows infrarenal aneurysmal dilatation of abdominal aorta measuring 5 x 4.9 cm, generally unchanged from prior exam. Patient denies any recent abdominal pain. -CMET generally unchanged from prior exams. Cr at baseline at 1.77. No electrolyte disturbances.   Patient still very unstable on his feet, bradycardic in the 30's-40's. Will admit to triad hospitalist.     Courtney Paris, MD 05/14/13 [phone]

## 2013-05-14 NOTE — ED Notes (Signed)
Pt denies swallowing difficulties, able to cough and breathe without difficulty.

## 2013-05-14 NOTE — Progress Notes (Signed)
Report received from [place], [place] in ED. Awaiting pt arrival.

## 2013-05-14 NOTE — ED Notes (Signed)
Pt from home.  Pt woke around 1200 and states he was dizzy when he tried to get up.  Pt denies dizziness while sitting/laying.  Home health nurse called EMS and reported pt's HR in the 30's.  EMS reports pt HR to be low to mid 40's with 1st degree AVB.  Pt denies pain.  Per EMS, pt recently d/c'd from [facility] after tx for pneumonia.

## 2013-05-14 NOTE — ED Provider Notes (Signed)
I saw and evaluated the patient, reviewed the resident's note and I agree with the findings and plan. Patient is an 79 year old male brought for evaluation of dizziness. He has an extensive past medical history including coronary artery disease, known AAA, hypertension. States that he feels dizzy and lightheaded when he stands. He denies any chest pain or shortness of breath. Denies any fevers chills or cough. He denies any headache.  On exam the patient is afebrile, and vitals are stable with the exception of a heart rate in the upper 30s to lower 40s. Heart is bradycardic without murmurs and lungs are clear. Neurologically cranial nerves are intact there are no focal strength deficits.  Workup reveals a sinus bradycardia however laboratory studies are essentially otherwise unremarkable. CT scan of the head shows no acute process and CT of the abdomen and pelvis reveals the aneurysm to be unruptured. He was given meclizine as his symptoms sounded somewhat vertiginous in nature however this did not help much. He was ambulated and did not tolerate this well. It appears as though he may be symptomatic from this sinus bradycardia and feel as though he should be admitted for observation. I discussed the case with internal medicine and they agree to admit.  EKG Interpretation    Date/Time:  Wednesday May 14 2013 14:10:00 EST Ventricular Rate:  43 PR Interval:  281 QRS Duration: 90 QT Interval:  464 QTC Calculation: 392 R Axis:   70 Text Interpretation:  Sinus bradycardia Prolonged PR interval Anterior infarct, old Confirmed by Malva Cogan  MD, Mulan Adan ([id number]) on 05/14/2013 2:12:28 PM              Geoffery Lyons, MD 05/14/13 1958

## 2013-05-14 NOTE — H&P (Signed)
[facility] History and Physical  Patient: [name]  NFA:[id number]  DOB: Sep 08, 1927  DOS: the patient was seen and examined on 05/14/2013 PCP: [name], MD  Chief Complaint: Dizziness  HPI: [name] is a 78 y.o. male with Past medical history of Diabetes mellitus, hypertension, coronary artery disease, AAA, recent admission for pneumonia and recently discharged from nursing home. The patient is coming from home. The patient presents with the complaint of dizziness that started this morning when he woke up.Mentions that when he woke up he tried to stand up 3 times and he would fall back due to dizziness and lightheadedness. He denied any vertiginous sensation. After that he attempted to stand up and used to the wall and other support to reach to the restroom. After that he stayed lying down throughout the day. Home health nurse arrived at home and found that he was bradycardic in the 30s and therefore referred him to the hospital. The history was obtained both from patient as well as his sister. As per the sister the patient has chronic polyneuropathy for which she wears braces to support his legs since last one and a half year and he was not wearing them this morning. Also in the past he had a history of falling back after standing up for long time for unknown reason. Patient denies any changes in his medication, trauma, headache, focal neurological deficit, difficulty speaking and swallowing, chest pain, palpitation, shortness of breath, nausea vomiting or diarrhea, abdominal pain, burning urination.  Review of Systems: as mentioned in the history of present illness.  A Comprehensive review of the other systems is negative.  Past Medical History  Diagnosis Date  . Arthritis   . Depression   . Diabetes mellitus   . Hypertension   . Hyperlipidemia   . Myocardial infarction 1984  . Colon cancer   . AAA (abdominal aortic aneurysm) 2011  . CAD (coronary artery  disease) 2003  . Chronic kidney disease   . Anemia   . Diverticula of colon 2013  . Hypertrophy of prostate without urinary obstruction and other lower urinary tract symptoms (LUTS) 2008  . Hyperpotassemia 2008  . Gout, unspecified 2008  . Impacted cerumen 06/20/2006  . First degree atrioventricular block 2008  . Abnormality of gait 2007  . Other specified cardiac dysrhythmias(427.89) 2005  . Unspecified disorder of kidney and ureter 2005  . Edema 2005  . Malignant neoplasm of colon, unspecified site 2004  . Unspecified hereditary and idiopathic peripheral neuropathy 2004  . Chest pain, unspecified 2003  . Pain in joint, pelvic region and thigh 2004  . Pain in limb 2003   Past Surgical History  Procedure Laterality Date  . Mediansternotomy    . Right colectomy  2004  . Coronary artery bypass graft  1996    Bartle, MD  . Hernia repair Right 1952    In [employer]   Social History:  reports that he quit smoking about 30 years ago. His smoking use included Cigarettes. He smoked 0.00 packs per day. He has never used smokeless tobacco. He reports that he drinks alcohol. He reports that he does not use illicit drugs. Independent for most of his  ADL.  Allergies  Allergen Reactions  . Diovan [Valsartan] Other (See Comments)    Unknown     Family History  Problem Relation Age of Onset  . Heart disease Father   . Pancreatic cancer Sister   . Cancer Sister  liver  . Cancer Brother     Leukemia  . Cancer Mother     ?    Prior to Admission medications   Medication Sig Start Date End Date Taking? Authorizing Provider  ALPRAZolam (XANAX) 0.25 MG tablet Take 1 tablet (0.25 mg total) by mouth 2 (two) times daily as needed for anxiety. 05/07/13  Yes Claudie Revering, NP  aspirin 81 MG tablet Take 81 mg by mouth daily. Take 1 tablet daily to prevent stroke and heart attack.   Yes Historical Provider, MD  donepezil (ARICEPT) 10 MG tablet Take 10 mg by mouth at bedtime.   Yes Historical  Provider, MD  hydrALAZINE (APRESOLINE) 25 MG tablet Take 75 mg by mouth 3 (three) times daily. 03/10/13  Yes Marianne L York, PA-C  lisinopril (PRINIVIL,ZESTRIL) 20 MG tablet Take 1 tablet (20 mg total) by mouth daily. 03/10/13  Yes Marianne L York, PA-C  memantine (NAMENDA) 10 MG tablet Take 1 tablet (10 mg total) by mouth 2 (two) times daily. 04/29/13  Yes Claudie Revering, NP  mirtazapine (REMERON) 7.5 MG tablet Take 1 tablet (7.5 mg total) by mouth at bedtime. 02/14/13  Yes Tiffany L Reed, DO  simvastatin (ZOCOR) 40 MG tablet Take 40 mg by mouth at bedtime. Take 1 tablet once daily to lower cholesterol.   Yes Historical Provider, MD    Physical Exam: Filed Vitals:   05/14/13 1839 05/14/13 1900 05/14/13 2029 05/14/13 2102  BP: 187/55 175/59 168/69 168/53  Pulse: 42 46  40  Temp: 97.9 F (36.6 C)   97.9 F (36.6 C)  TempSrc: Oral   Oral  Resp: 20 19 18 16   Height:    6' (1.829 m)  Weight:    72.349 kg (159 lb 8 oz)  SpO2: 99% 100% 98% 100%    General: Alert, Awake and Oriented to Time, Place and Person. Appear in mild distress Eyes: PERRL ENT: Oral Mucosa clear moist. Neck: no JVD, Right-sided carotid bruit heard Cardiovascular: S1 and S2 Present, No Murmur, Peripheral Pulses Present Respiratory: Bilateral Air entry equal and Decreased, Clear to Auscultation,  No Crackles,No wheezes Abdomen: Bowel Sound Present, Soft and Non tender Skin: No Rash Extremities: No Pedal edema, No calf tenderness Neurologic: Grossly Unremarkable.  Labs on Admission:  CBC:  Recent Labs Lab 05/14/13 1705  WBC 5.7  HGB 11.3*  HCT 33.4*  MCV 91.5  PLT 181    CMP     Component Value Date/Time   NA 140 05/14/2013 1705   NA 142 11/20/2012 1618   K 4.5 05/14/2013 1705   CL 108 05/14/2013 1705   CO2 22 05/14/2013 1705   GLUCOSE 105* 05/14/2013 1705   GLUCOSE 150* 11/20/2012 1618   BUN 31* 05/14/2013 1705   BUN 30* 11/20/2012 1618   CREATININE 1.77* 05/14/2013 1705   CREATININE 1.90* 05/29/2011 1040    CALCIUM 9.0 05/14/2013 1705   PROT 6.1 05/14/2013 1705   ALBUMIN 3.3* 05/14/2013 1705   AST 18 05/14/2013 1705   ALT 12 05/14/2013 1705   ALKPHOS 69 05/14/2013 1705   BILITOT 0.2* 05/14/2013 1705   GFRNONAA 34* 05/14/2013 1705   GFRAA 39* 05/14/2013 1705    No results found for this basename: LIPASE, AMYLASE,  in the last 168 hours No results found for this basename: AMMONIA,  in the last 168 hours  No results found for this basename: CKTOTAL, CKMB, CKMBINDEX, TROPONINI,  in the last 168 hours BNP (last 3 results)  Recent Labs  03/05/13 0230  PROBNP 675.0*    Radiological Exams on Admission: Ct Abdomen Pelvis Wo Contrast  05/14/2013   CLINICAL DATA:  Abdominal pain, fall 2 weeks ago  EXAM: CT ABDOMEN AND PELVIS WITHOUT CONTRAST  TECHNIQUE: Multidetector CT imaging of the abdomen and pelvis was performed following the standard protocol without intravenous contrast.  COMPARISON:  12/11/2011  FINDINGS: Lung bases are unremarkable. Sagittal images of the spine shows significant disc space flattening with endplate sclerotic changes mild anterior and mild posterior spurring and vacuum disc phenomenon at L3-L4 level. There is disc space flattening with mild posterior spurring at L5-S1 level.  Atherosclerotic calcifications of abdominal aorta, celiac trunk origin, renal artery origin, splenic artery and iliac arteries are noted. SMA calcifications are noted. Again noted aneurysmal dilatation of distal abdominal aorta measures 4.9 by 5 cm in diameter. On the prior exam measures 4.9 x 4.9 cm in diameter. No periaortic fluid or leak is noted.  Unenhanced liver shows no biliary ductal dilatation. No calcified gallstones are noted within gallbladder. Again noted status post partial right hemicolectomy. Unenhanced right kidney is unchanged in size in appearance. No right hydronephrosis. Adrenal glands are unremarkable. The spleen and pancreas are unremarkable.  Left kidney surgically absent.  No small bowel or  colonic obstruction. Moderate stool noted in distal colon. Left colon and sigmoid colon diverticula are noted. No evidence of acute diverticulitis. Again noted atherosclerotic calcifications and aneurysmal dilatation of common iliac arteries. Right common iliac artery measures 2 cm in diameter. Left common iliac artery measures 1.7 cm in diameter. The urinary bladder is unremarkable. Prostate gland and seminal vesicles are unremarkable. No destructive bony lesions are noted within pelvis. Atherosclerotic calcifications of femoral arteries.  IMPRESSION: 1. Degenerative changes lumbar spine at L3-L4 and L5-S1 level. 2. Again noted infrarenal aneurysmal dilatation of abdominal aorta measures 5 x 4.9 cm on the prior exam measures 4.9 cm. 3. Stable in appearance unenhanced right kidney. No right hydronephrosis. The left kidney is surgically absent. 4. Again noted status post partial right hemicolectomy. No small bowel or colonic obstruction. No adenopathy. 5. Distal colonic diverticula without evidence of acute diverticulitis.   Electronically Signed   By: Natasha Mead M.D.   On: 05/14/2013 16:53   Dg Chest 2 View  05/14/2013   CLINICAL DATA:  Bradycardia.  Dizziness.  EXAM: CHEST  2 VIEW  COMPARISON:  03/06/2013  FINDINGS: Abnormal density overlies the left post for lateral rib fractures. Mildly tortuous thoracic aorta. Biapical pleural parenchymal scarring.  Prior CABG.  Mildly tortuous thoracic aorta.  IMPRESSION: 1. Abnormal density, left upper lobe, suspicious for lung cancer. There were clearly some infectious/ inflammatory densities on the prior chest CT from 03/06/2013, but I am highly suspicious for low grade adenocarcinoma in the left upper lobe given the persistence today. I recommend followup chest CT now with comparison to CT chest exams back through 2007 in order to assess for growth.   Electronically Signed   By: Herbie Baltimore M.D.   On: 05/14/2013 16:21   Ct Head Wo Contrast  05/14/2013   CLINICAL  DATA:  Dizziness.  EXAM: CT HEAD WITHOUT CONTRAST  TECHNIQUE: Contiguous axial images were obtained from the base of the skull through the vertex without intravenous contrast.  COMPARISON:  CT scan of March 04, 2013.  FINDINGS: Bony calvarium appears intact. Diffuse cortical atrophy is noted. Chronic ischemic white matter disease is noted. No mass effect or midline shift is noted. Ventricular size is within normal limits. There is  no evidence of mass lesion, hemorrhage or acute infarction.  IMPRESSION: Mild diffuse cortical atrophy. Chronic ischemic white matter disease. No acute intracranial abnormality seen.   Electronically Signed   By: Roque Lias M.D.   On: 05/14/2013 16:42    EKG: Independently reviewed. sinus bradycardia.  Assessment/Plan Active Problems:   PERIPHERAL NEUROPATHY   Abdominal aneurysm without mention of rupture   Dementia without behavioral disturbance   Symptomatic bradycardia   Dizziness   1. Symptomatic bradycardia The patient is presenting with sinus bradycardia with heart rate in 40s. He was unable to walk in the ED without any gait imbalance. As per the ED physician he was also orthostatic. His EKG is suggestive of sinus bradycardia and he does have history of First degree heart block, Currently his heart rate has improved and 50s with minimal change in his symptoms. I would admit him to the hospital for observation and monitor him on telemetry. I would also consult physical therapy and occupational therapy for assistance. He does not have any recent URTI to suggest labyrinthitis.  2.Dizziness Likely secondary from bradycardia. His CT scan of the head is not showing any acute abnormality. He does not have any focal neurological deficit on exam. I would obtain serial neuro checks and if there is any worsening or lack of improvement in his symptom that he may require further imaging or neurological consult. We'll obtain a carotid Doppler for his carotid  bruit.  3.Dementia Continue home medications  4.AAA At present stable in size continue to monitor.  DVT Prophylaxis: subcutaneous Heparin Nutrition: Cardiac  Code Status: Full  Family Communication: Sister was present at bedside, opportunity was given to ask question and all questions were answered satisfactorily at the time of interview. Disposition: Admitted to observation in telemetry unit.  Author: Lynden Oxford, MD [facility] Pager: [phone] 05/14/2013, 9:08 PM    If 7PM-7AM, please contact night-coverage [email or web address] Password [email or web address]

## 2013-05-15 ENCOUNTER — Inpatient Hospital Stay (HOSPITAL_COMMUNITY): Payer: Medicare Other

## 2013-05-15 ENCOUNTER — Encounter (HOSPITAL_COMMUNITY): Payer: Self-pay | Admitting: General Practice

## 2013-05-15 DIAGNOSIS — I2510 Atherosclerotic heart disease of native coronary artery without angina pectoris: Secondary | ICD-10-CM

## 2013-05-15 DIAGNOSIS — I495 Sick sinus syndrome: Secondary | ICD-10-CM

## 2013-05-15 DIAGNOSIS — I517 Cardiomegaly: Secondary | ICD-10-CM

## 2013-05-15 LAB — PROTIME-INR
INR: 1.03 (ref 0.00–1.49)
Prothrombin Time: 13.3 seconds (ref 11.6–15.2)

## 2013-05-15 LAB — COMPREHENSIVE METABOLIC PANEL
Albumin: 3.1 g/dL — ABNORMAL LOW (ref 3.5–5.2)
Alkaline Phosphatase: 64 U/L (ref 39–117)
BUN: 32 mg/dL — ABNORMAL HIGH (ref 6–23)
GFR calc Af Amer: 40 mL/min — ABNORMAL LOW (ref >90)
Glucose, Bld: 95 mg/dL (ref 70–99)
Potassium: 4.5 mEq/L (ref 3.5–5.1)
Total Protein: 5.8 g/dL — ABNORMAL LOW (ref 6.0–8.3)

## 2013-05-15 LAB — CBC
HCT: 32 % — ABNORMAL LOW (ref 39.0–52.0)
MCH: 30.7 pg (ref 26.0–34.0)
MCHC: 34.1 g/dL (ref 30.0–36.0)
Platelets: 180 10*3/uL (ref 150–400)
RDW: 14.9 % (ref 11.5–15.5)
WBC: 4.8 10*3/uL (ref 4.0–10.5)

## 2013-05-15 MED ORDER — ENSURE COMPLETE PO LIQD
237.0000 mL | Freq: Two times a day (BID) | ORAL | Status: DC
  Administered 2013-05-15 – 2013-05-17 (×4): 237 mL via ORAL

## 2013-05-15 NOTE — Progress Notes (Signed)
Echocardiogram 2D Echocardiogram has been performed.  Keanon Bevins 05/15/2013, 4:04 PM

## 2013-05-15 NOTE — Care Management Note (Unsigned)
    Page 1 of 1   05/15/2013     4:24:48 PM   CARE MANAGEMENT NOTE 05/15/2013  Patient:  [name], [name]   Account Number:  [id number]  Date Initiated:  05/15/2013  Documentation initiated by:  Nomie Buchberger  Subjective/Objective Assessment:   PT ADM ON 12/3 WITH SYMPTOMATIC BRADYCARDIA. PTA, PT RESIDED AT HOME WITH 24H CAREGIVER AND HH PROVIDED BY [facility].     Action/Plan:   WILL CONT TO FOLLOW FOR DC NEEDS AS PT PROGRESSES.  PT WILL NEED RESUMPTION OF CARE ORDERS PRIOR TO DC.   Anticipated DC Date:  05/17/2013   Anticipated DC Plan:  HOME W HOME HEALTH SERVICES      DC Planning Services  CM consult      [facility] Choice  Resumption Of Svcs/PTA Provider   Choice offered to / List presented to:             Status of service:  In process, will continue to follow Medicare Important Message given?   (If response is "NO", the following Medicare IM given date fields will be blank) Date Medicare IM given:   Date Additional Medicare IM given:    Discharge Disposition:    Per UR Regulation:    If discussed at Long Length of Stay Meetings, dates discussed:    Comments:  05/15/13 Annett Boxwell,RN,BSN [phone] MET WITH PT'S SISTER AT BEDSIDE TO DISCUSS DC PLANS. SISTER STATES PT WAS AT [facility] UNTIL LAST MONDAY.  SHE STATES NOW PT AT HOME WITH 24HR CAREGIVER AND HAS HH SERVICES WITH [facility].  PT HAS HAD 2 SNF STAYS WITHIN THE LAST YEAR, AND PT/FAMILY PREFER TO KEEP PT AT HOME WITH CAREGIVER, IF AT ALL POSSIBLE.  WILL NEED RESUMPTION OF HH ORDERS PRIOR TO DC.  WILL NOTIFY [facility] OF PT ADMISSION.

## 2013-05-15 NOTE — Consult Note (Addendum)
CARDIOLOGY CONSULT NOTE   Patient ID: [name] MRN: [id number], DOB/AGE: 1929-10-77   Admit date: 05/14/2013 Date of Consult: 05/15/2013   Primary Physician: [name], MD Primary Cardiologist: None  Pt. Profile  Elderly WM lives in [place] with a friend.  Problem List  Past Medical History  Diagnosis Date  . Arthritis   . Depression   . Diabetes mellitus   . Hypertension   . Hyperlipidemia   . Colon cancer   . AAA (abdominal aortic aneurysm) 2011  . CAD (coronary artery disease) 2003  . Anemia   . Diverticula of colon 2013  . Hypertrophy of prostate without urinary obstruction and other lower urinary tract symptoms (LUTS) 2008  . Hyperpotassemia 2008  . Gout, unspecified 2008  . Impacted cerumen 06/20/2006  . First degree atrioventricular block 2008  . Abnormality of gait 2007  . Other specified cardiac dysrhythmias(427.89) 2005  . Unspecified disorder of kidney and ureter 2005  . Edema 2005  . Malignant neoplasm of colon, unspecified site 2004  . Unspecified hereditary and idiopathic peripheral neuropathy 2004  . Chest pain, unspecified 2003  . Pain in joint, pelvic region and thigh 2004  . Pain in limb 2003  . Myocardial infarction 1984  . Exertional shortness of breath   . Pneumonia ~ 02/2013    "hospitalized for double pneumonia" (05/15/2013)  . Anxiety   . Chronic kidney disease     "born w/only 1 kidney" (05/15/2013)  . Chronic kidney disease (CKD), stage III (moderate)     [name] 05/14/2013 (05/15/2013)  . Postural dizziness     hospitalized 05/14/2013    Past Surgical History  Procedure Laterality Date  . Mediastinotomy    . Right colectomy  2004  . Inguinal hernia repair Right 1952    In [employer]  . Coronary artery bypass graft  1996    Bartle, MD  . Cardiac catheterization    . Colon surgery       Allergies  Allergies  Allergen Reactions  . Diovan [Valsartan] Other (See Comments)    Unknown     HPI   This elderly male  awoke yesterday very dizzy and had trouble getting out of bed. Noted nausea and sensation of spinning. No chest or abdominal pain. Has remote history of CABG and has diabetic neuropathy. States takes no meds for his diabetes.  Inpatient Medications  . aspirin EC  81 mg Oral Daily  . donepezil  10 mg Oral QHS  . heparin  5,000 Units Subcutaneous Q8H  . hydrALAZINE  75 mg Oral TID  . lisinopril  20 mg Oral Daily  . memantine  10 mg Oral BID WC  . mirtazapine  7.5 mg Oral QHS  . simvastatin  40 mg Oral QHS  . sodium chloride  3 mL Intravenous Q12H    Family History Family History  Problem Relation Age of Onset  . Heart disease Father   . Pancreatic cancer Sister   . Cancer Sister     liver  . Cancer Brother     Leukemia  . Cancer Mother     ?     Social History History   Social History  . Marital Status: Single    Spouse Name: N/A    Number of Children: 0  . Years of Education: N/A   Occupational History  .     Social History Main Topics  . Smoking status: Former Smoker -- 1.00 packs/day for 35 years  Types: Cigarettes    Quit date: 06/12/1982  . Smokeless tobacco: Former Neurosurgeon    Types: Snuff  . Alcohol Use: Yes     Comment: 05/15/2013 "have a beer sometimes; not very often"  . Drug Use: No  . Sexual Activity: No   Other Topics Concern  . Not on file   Social History Narrative  . No narrative on file     Review of Systems  General:  No chills, fever, night sweats ,Nonintentional weight loss of about 30 lb in past year. Cardiovascular:  No chest pain, dyspnea on exertion, edema, orthopnea, palpitations, paroxysmal nocturnal dyspnea. Dermatological: No rash, lesions/masses Respiratory: No cough, dyspnea Urologic: No hematuria, dysuria Abdominal:   No nausea, vomiting, diarrhea, bright red blood per rectum, melena, or hematemesis Neurologic:  No visual changes, wkns, changes in mental status. All other systems reviewed and are otherwise negative except  as noted above.  Physical Exam  Blood pressure 172/80, pulse 71, temperature 98.4 F (36.9 C), temperature source Oral, resp. rate 18, height 6' (1.829 m), weight 157 lb 14.4 oz (71.623 kg), SpO2 95.00%.  General: Pleasant, NAD. Mildly demented and does not know any of his home meds. Psych: Normal affect. Neuro: Alert. Moves all extremities spontaneously. Wears foot braces for foot drop on both feet. HEENT: Normal  Neck: Supple without bruits or JVD. Lungs:  Resp regular and unlabored, CTA. Heart: RRR no s3, s4, or murmurs. Abdomen: Soft, non-tender, non-distended, BS + x 4. There is a prominent epigastric pulsatile mass ?AAA Extremities: No clubbing, cyanosis or edema. DP/PT/Radials 2+ and equal bilaterally.  Labs   Recent Labs  05/14/13 2205 05/15/13 0318  TROPONINI <0.30 <0.30   Lab Results  Component Value Date   WBC 4.8 05/15/2013   HGB 10.9* 05/15/2013   HCT 32.0* 05/15/2013   MCV 90.1 05/15/2013   PLT 180 05/15/2013     Recent Labs Lab 05/15/13 0318  NA 139  K 4.5  CL 107  CO2 22  BUN 32*  CREATININE 1.75*  CALCIUM 8.5  PROT 5.8*  BILITOT 0.2*  ALKPHOS 64  ALT 12  AST 19  GLUCOSE 95   Lab Results  Component Value Date   CHOL 116 03/06/2013   HDL 37* 03/06/2013   LDLCALC 57 03/06/2013   TRIG 109 03/06/2013   No results found for this basename: DDIMER    Radiology/Studies  Ct Abdomen Pelvis Wo Contrast  05/14/2013   CLINICAL DATA:  Abdominal pain, fall 2 weeks ago  EXAM: CT ABDOMEN AND PELVIS WITHOUT CONTRAST  TECHNIQUE: Multidetector CT imaging of the abdomen and pelvis was performed following the standard protocol without intravenous contrast.  COMPARISON:  12/11/2011  FINDINGS: Lung bases are unremarkable. Sagittal images of the spine shows significant disc space flattening with endplate sclerotic changes mild anterior and mild posterior spurring and vacuum disc phenomenon at L3-L4 level. There is disc space flattening with mild posterior spurring at  L5-S1 level.  Atherosclerotic calcifications of abdominal aorta, celiac trunk origin, renal artery origin, splenic artery and iliac arteries are noted. SMA calcifications are noted. Again noted aneurysmal dilatation of distal abdominal aorta measures 4.9 by 5 cm in diameter. On the prior exam measures 4.9 x 4.9 cm in diameter. No periaortic fluid or leak is noted.  Unenhanced liver shows no biliary ductal dilatation. No calcified gallstones are noted within gallbladder. Again noted status post partial right hemicolectomy. Unenhanced right kidney is unchanged in size in appearance. No right hydronephrosis. Adrenal glands are unremarkable.  The spleen and pancreas are unremarkable.  Left kidney surgically absent.  No small bowel or colonic obstruction. Moderate stool noted in distal colon. Left colon and sigmoid colon diverticula are noted. No evidence of acute diverticulitis. Again noted atherosclerotic calcifications and aneurysmal dilatation of common iliac arteries. Right common iliac artery measures 2 cm in diameter. Left common iliac artery measures 1.7 cm in diameter. The urinary bladder is unremarkable. Prostate gland and seminal vesicles are unremarkable. No destructive bony lesions are noted within pelvis. Atherosclerotic calcifications of femoral arteries.  IMPRESSION: 1. Degenerative changes lumbar spine at L3-L4 and L5-S1 level. 2. Again noted infrarenal aneurysmal dilatation of abdominal aorta measures 5 x 4.9 cm on the prior exam measures 4.9 cm. 3. Stable in appearance unenhanced right kidney. No right hydronephrosis. The left kidney is surgically absent. 4. Again noted status post partial right hemicolectomy. No small bowel or colonic obstruction. No adenopathy. 5. Distal colonic diverticula without evidence of acute diverticulitis.   Electronically Signed   By: Natasha Mead M.D.   On: 05/14/2013 16:53   Dg Chest 2 View  05/14/2013   CLINICAL DATA:  Bradycardia.  Dizziness.  EXAM: CHEST  2 VIEW   COMPARISON:  03/06/2013  FINDINGS: Abnormal density overlies the left post for lateral rib fractures. Mildly tortuous thoracic aorta. Biapical pleural parenchymal scarring.  Prior CABG.  Mildly tortuous thoracic aorta.  IMPRESSION: 1. Abnormal density, left upper lobe, suspicious for lung cancer. There were clearly some infectious/ inflammatory densities on the prior chest CT from 03/06/2013, but I am highly suspicious for low grade adenocarcinoma in the left upper lobe given the persistence today. I recommend followup chest CT now with comparison to CT chest exams back through 2007 in order to assess for growth.   Electronically Signed   By: Herbie Baltimore M.D.   On: 05/14/2013 16:21   Ct Head Wo Contrast  05/14/2013   CLINICAL DATA:  Dizziness.  EXAM: CT HEAD WITHOUT CONTRAST  TECHNIQUE: Contiguous axial images were obtained from the base of the skull through the vertex without intravenous contrast.  COMPARISON:  CT scan of March 04, 2013.  FINDINGS: Bony calvarium appears intact. Diffuse cortical atrophy is noted. Chronic ischemic white matter disease is noted. No mass effect or midline shift is noted. Ventricular size is within normal limits. There is no evidence of mass lesion, hemorrhage or acute infarction.  IMPRESSION: Mild diffuse cortical atrophy. Chronic ischemic white matter disease. No acute intracranial abnormality seen.   Electronically Signed   By: Roque Lias M.D.   On: 05/14/2013 16:42    ECG  Marked sinus bradycardia with first degree block.  ASSESSMENT AND PLAN 1. Dizziness possibly secondary to marked sinus bradycardia. 2. Unintended weight loss. Possible left upper lobe lung cancer by CT scan. Also has AAA by CT scan. Per primary service. 3. Dementia. 4. Remote CABG. Denies chest pain.  Plan: Will get 2D echo. Will stop donepezil which can cause bradycardia and heart block and observe response.   Signed, Cassell Clement, MD  05/15/2013, 10:19 AM

## 2013-05-15 NOTE — Progress Notes (Signed)
INITIAL NUTRITION ASSESSMENT  DOCUMENTATION CODES Per approved criteria  -Severe malnutrition in the context of chronic illness   INTERVENTION: Ensure Complete po BID, each supplement provides 350 kcal and 13 grams of protein.  NUTRITION DIAGNOSIS: Malnutrition related to chronic illness as evidenced by 10% weight loss x 3 months, intake </= 75% of his needs for >/= 1 month and severe fat and muscle wasting.   Goal: Pt to meet >/= 90% of their estimated nutrition needs   Monitor:  PO intake, weight trend, labs, supplement acceptance  Reason for Assessment: Pt identified as at nutrition risk on the Malnutrition Screen Tool  79 y.o. male  Admitting Dx: Symptomatic bradycardia  ASSESSMENT: Pt with hospitalization in 9/14 for PNA. Pt recently d/c'ed from SNF and was at home PTA. Pt admitted with dizziness determined from sinus bradycardia, cardiology following.  Per cardiologist note pt with possible left upper lobe lung cancer by CT. Pt states that he was not eating in rehab because he hated the food, otherwise he feels that he has a good appetite. Pt likes ensure and is willing to drink. Pt consumed 100% of his lunch today and feels that he likes the food here.   Nutrition Focused Physical Exam:  Subcutaneous Fat:  Orbital Region: WNL Upper Arm Region: WNL Thoracic and Lumbar Region: severe wasting  Muscle:  Temple Region: severe wasting Clavicle Bone Region: severe wasting Clavicle and Acromion Bone Region: severe wasting Scapular Bone Region: severe wasting Dorsal Hand: severe wasting Patellar Region: severe wasting Anterior Thigh Region: severe wasting Posterior Calf Region: severe wasting  Edema: not present   Height: Ht Readings from Last 1 Encounters:  05/14/13 6' (1.829 m)    Weight: Wt Readings from Last 1 Encounters:  05/15/13 157 lb 14.4 oz (71.623 kg)    Ideal Body Weight: 80.9 kg   % Ideal Body Weight: 89%  Wt Readings from Last 10 Encounters:   05/15/13 157 lb 14.4 oz (71.623 kg)  04/01/13 165 lb 3.2 oz (74.934 kg)  03/06/13 172 lb 9.9 oz (78.3 kg)  02/14/13 175 lb 12.8 oz (79.742 kg)  02/06/13 178 lb (80.74 kg)  01/15/13 174 lb 3.2 oz (79.017 kg)  12/18/12 174 lb 3.2 oz (79.017 kg)  11/20/12 171 lb 12.8 oz (77.928 kg)  07/01/12 193 lb (87.544 kg)  06/23/11 189 lb (85.73 kg)    Usual Body Weight: 174 lb   % Usual Body Weight: 90%  BMI:  Body mass index is 21.41 kg/(m^2).  Estimated Nutritional Needs: Kcal: 1800-2000 Protein: 90-105 grams Fluid: > 1.8 L/day  Skin: no issues noted  Diet Order: Cardiac Meal Completion: 100%  EDUCATION NEEDS: -No education needs identified at this time   Intake/Output Summary (Last 24 hours) at 05/15/13 1237 Last data filed at 05/15/13 0900  Gross per 24 hour  Intake    360 ml  Output    726 ml  Net   -366 ml    Last BM: 12/4   Labs:   Recent Labs Lab 05/14/13 1705 05/15/13 0318  NA 140 139  K 4.5 4.5  CL 108 107  CO2 22 22  BUN 31* 32*  CREATININE 1.77* 1.75*  CALCIUM 9.0 8.5  GLUCOSE 105* 95    CBG (last 3)   Recent Labs  05/14/13 2205 05/15/13 0614  GLUCAP 128* 77    Scheduled Meds: . aspirin EC  81 mg Oral Daily  . heparin  5,000 Units Subcutaneous Q8H  . hydrALAZINE  75 mg Oral  TID  . lisinopril  20 mg Oral Daily  . memantine  10 mg Oral BID WC  . mirtazapine  7.5 mg Oral QHS  . simvastatin  40 mg Oral QHS  . sodium chloride  3 mL Intravenous Q12H    Continuous Infusions:   Past Medical History  Diagnosis Date  . Arthritis   . Depression   . Diabetes mellitus   . Hypertension   . Hyperlipidemia   . Colon cancer   . AAA (abdominal aortic aneurysm) 2011  . CAD (coronary artery disease) 2003  . Anemia   . Diverticula of colon 2013  . Hypertrophy of prostate without urinary obstruction and other lower urinary tract symptoms (LUTS) 2008  . Hyperpotassemia 2008  . Gout, unspecified 2008  . Impacted cerumen 06/20/2006  . First  degree atrioventricular block 2008  . Abnormality of gait 2007  . Other specified cardiac dysrhythmias(427.89) 2005  . Unspecified disorder of kidney and ureter 2005  . Edema 2005  . Malignant neoplasm of colon, unspecified site 2004  . Unspecified hereditary and idiopathic peripheral neuropathy 2004  . Chest pain, unspecified 2003  . Pain in joint, pelvic region and thigh 2004  . Pain in limb 2003  . Myocardial infarction 1984  . Exertional shortness of breath   . Pneumonia ~ 02/2013    "hospitalized for double pneumonia" (05/15/2013)  . Anxiety   . Chronic kidney disease     "born w/only 1 kidney" (05/15/2013)  . Chronic kidney disease (CKD), stage III (moderate)     [name] 05/14/2013 (05/15/2013)  . Postural dizziness     hospitalized 05/14/2013    Past Surgical History  Procedure Laterality Date  . Mediastinotomy    . Right colectomy  2004  . Inguinal hernia repair Right 1952    In [employer]  . Coronary artery bypass graft  1996    Bartle, MD  . Cardiac catheterization    . Colon surgery      Kendell Bane RD, LDN, CNSC [phone] Pager [phone] After Hours Pager

## 2013-05-15 NOTE — Progress Notes (Signed)
[facility]                                                                                Patient Demographics  [name], is a 79 y.o. male, DOB - 12-Jun-1928, AVW:[id number]  Admit date - 05/14/2013   Admitting Physician Lynden Oxford, MD  Outpatient Primary MD for the patient is GREEN, [name], MD  LOS - 1   Chief Complaint  Patient presents with  . Bradycardia  . Dizziness        Assessment & Plan    1. Dizziness. Question if this is due to symptomatic bradycardia, heart rate upon ambulation along with blood pressure is satisfactory, he's not orthostatic, does not have nystagmus on exam, no signs of vertigo, will have cardiology evaluate, check echogram, check TSH, will have PT monitor for balance issues.    2 H/O AAA - in September on his last admission the size was 5.5 cm, at that time case was discussed with his vascular surgeon, Dr. Myra Gianotti, who wanted to follow in the outpatient setting. Will repeat ultrasound to make sure AAA size is not worse, will monitor. Not use beta blockers due to issues with bradycardia.   3. Chronic kidney disease stage III. Baseline creatinine is around 1.7. He is at baseline.    4.Coronary artery disease status post CABG 1996 - pain and symptom-free no acute issues.    5. History of right thyroid nodule which was 2 cm in size found a few months ago last admission. Obtain dedicated thyroid ultrasound.  Lab Results  Component Value Date   TSH 1.707 03/08/2013     6. History of dementia. He is at baseline no acute issues. Remains at risk for delirium. Continue memantine.     7. Hypertension. Stable on combination of lisinopril and hydralazine     8. Dyslipidemia. Home dose statin continued.      Code Status: Full  Family Communication: None present  Disposition Plan: SNF   Procedures thyroid ultrasound, abdominal aortic ultrasound   Consults  Cards   Medications  Scheduled Meds: . aspirin EC  81  mg Oral Daily  . heparin  5,000 Units Subcutaneous Q8H  . hydrALAZINE  75 mg Oral TID  . lisinopril  20 mg Oral Daily  . memantine  10 mg Oral BID WC  . mirtazapine  7.5 mg Oral QHS  . simvastatin  40 mg Oral QHS  . sodium chloride  3 mL Intravenous Q12H   Continuous Infusions:  PRN Meds:.ALPRAZolam, ondansetron (ZOFRAN) IV, ondansetron  DVT Prophylaxis  heparin  Lab Results  Component Value Date   PLT 180 05/15/2013    Antibiotics     Anti-infectives   None          Subjective:   [name] today has, No headache, No chest pain, No abdominal pain - No Nausea, No new weakness tingling or numbness, No Cough - SOB.    Objective:   Filed Vitals:   05/15/13 0450 05/15/13 0837 05/15/13 0840 05/15/13 0843  BP: 108/69 163/68 152/74 172/80  Pulse: 54 50 64 71  Temp: 98.4 F (36.9 C)     TempSrc: Oral  Resp: 18     Height:      Weight: 71.623 kg (157 lb 14.4 oz)     SpO2: 98%  97% 95%    Wt Readings from Last 3 Encounters:  05/15/13 71.623 kg (157 lb 14.4 oz)  04/01/13 74.934 kg (165 lb 3.2 oz)  03/06/13 78.3 kg (172 lb 9.9 oz)     Intake/Output Summary (Last 24 hours) at 05/15/13 1332 Last data filed at 05/15/13 0900  Gross per 24 hour  Intake    360 ml  Output    726 ml  Net   -366 ml    Exam Awake Alert, Oriented X 3, No new F.N deficits, Normal affect [place].AT,PERRAL Supple Neck,No JVD, No cervical lymphadenopathy appriciated.  Symmetrical Chest wall movement, Good air movement bilaterally, CTAB RRR,No Gallops,Rubs or new Murmurs, No Parasternal Heave +ve B.Sounds, Abd Soft, pulsatile AAA, Non tender, No organomegaly appriciated, No rebound - guarding or rigidity. No Cyanosis, Clubbing or edema, No new Rash or bruise     Data Review   Micro Results Recent Results (from the past 240 hour(s))  MRSA PCR SCREENING     Status: None   Collection Time    05/14/13 11:21 PM      Result Value Range Status   MRSA by PCR NEGATIVE  NEGATIVE Final    Comment:            The GeneXpert MRSA Assay (FDA     approved for NASAL specimens     only), is one component of a     comprehensive MRSA colonization     surveillance program. It is not     intended to diagnose MRSA     infection nor to guide or     monitor treatment for     MRSA infections.    Radiology Reports Ct Abdomen Pelvis Wo Contrast  05/14/2013   CLINICAL DATA:  Abdominal pain, fall 2 weeks ago  EXAM: CT ABDOMEN AND PELVIS WITHOUT CONTRAST  TECHNIQUE: Multidetector CT imaging of the abdomen and pelvis was performed following the standard protocol without intravenous contrast.  COMPARISON:  12/11/2011  FINDINGS: Lung bases are unremarkable. Sagittal images of the spine shows significant disc space flattening with endplate sclerotic changes mild anterior and mild posterior spurring and vacuum disc phenomenon at L3-L4 level. There is disc space flattening with mild posterior spurring at L5-S1 level.  Atherosclerotic calcifications of abdominal aorta, celiac trunk origin, renal artery origin, splenic artery and iliac arteries are noted. SMA calcifications are noted. Again noted aneurysmal dilatation of distal abdominal aorta measures 4.9 by 5 cm in diameter. On the prior exam measures 4.9 x 4.9 cm in diameter. No periaortic fluid or leak is noted.  Unenhanced liver shows no biliary ductal dilatation. No calcified gallstones are noted within gallbladder. Again noted status post partial right hemicolectomy. Unenhanced right kidney is unchanged in size in appearance. No right hydronephrosis. Adrenal glands are unremarkable. The spleen and pancreas are unremarkable.  Left kidney surgically absent.  No small bowel or colonic obstruction. Moderate stool noted in distal colon. Left colon and sigmoid colon diverticula are noted. No evidence of acute diverticulitis. Again noted atherosclerotic calcifications and aneurysmal dilatation of common iliac arteries. Right common iliac artery measures 2 cm in  diameter. Left common iliac artery measures 1.7 cm in diameter. The urinary bladder is unremarkable. Prostate gland and seminal vesicles are unremarkable. No destructive bony lesions are noted within pelvis. Atherosclerotic calcifications of femoral arteries.  IMPRESSION: 1. Degenerative  changes lumbar spine at L3-L4 and L5-S1 level. 2. Again noted infrarenal aneurysmal dilatation of abdominal aorta measures 5 x 4.9 cm on the prior exam measures 4.9 cm. 3. Stable in appearance unenhanced right kidney. No right hydronephrosis. The left kidney is surgically absent. 4. Again noted status post partial right hemicolectomy. No small bowel or colonic obstruction. No adenopathy. 5. Distal colonic diverticula without evidence of acute diverticulitis.   Electronically Signed   By: Natasha Mead M.D.   On: 05/14/2013 16:53   Dg Chest 2 View  05/14/2013   CLINICAL DATA:  Bradycardia.  Dizziness.  EXAM: CHEST  2 VIEW  COMPARISON:  03/06/2013  FINDINGS: Abnormal density overlies the left post for lateral rib fractures. Mildly tortuous thoracic aorta. Biapical pleural parenchymal scarring.  Prior CABG.  Mildly tortuous thoracic aorta.  IMPRESSION: 1. Abnormal density, left upper lobe, suspicious for lung cancer. There were clearly some infectious/ inflammatory densities on the prior chest CT from 03/06/2013, but I am highly suspicious for low grade adenocarcinoma in the left upper lobe given the persistence today. I recommend followup chest CT now with comparison to CT chest exams back through 2007 in order to assess for growth.   Electronically Signed   By: Herbie Baltimore M.D.   On: 05/14/2013 16:21   Ct Head Wo Contrast  05/14/2013   CLINICAL DATA:  Dizziness.  EXAM: CT HEAD WITHOUT CONTRAST  TECHNIQUE: Contiguous axial images were obtained from the base of the skull through the vertex without intravenous contrast.  COMPARISON:  CT scan of March 04, 2013.  FINDINGS: Bony calvarium appears intact. Diffuse cortical  atrophy is noted. Chronic ischemic white matter disease is noted. No mass effect or midline shift is noted. Ventricular size is within normal limits. There is no evidence of mass lesion, hemorrhage or acute infarction.  IMPRESSION: Mild diffuse cortical atrophy. Chronic ischemic white matter disease. No acute intracranial abnormality seen.   Electronically Signed   By: Roque Lias M.D.   On: 05/14/2013 16:42    CBC  Recent Labs Lab 05/14/13 1705 05/15/13 0318  WBC 5.7 4.8  HGB 11.3* 10.9*  HCT 33.4* 32.0*  PLT 181 180  MCV 91.5 90.1  MCH 31.0 30.7  MCHC 33.8 34.1  RDW 15.0 14.9    Chemistries   Recent Labs Lab 05/14/13 1705 05/15/13 0318  NA 140 139  K 4.5 4.5  CL 108 107  CO2 22 22  GLUCOSE 105* 95  BUN 31* 32*  CREATININE 1.77* 1.75*  CALCIUM 9.0 8.5  AST 18 19  ALT 12 12  ALKPHOS 69 64  BILITOT 0.2* 0.2*   ------------------------------------------------------------------------------------------------------------------ estimated creatinine clearance is 32.4 ml/min (by C-G formula based on Cr of 1.75). ------------------------------------------------------------------------------------------------------------------ No results found for this basename: HGBA1C,  in the last 72 hours ------------------------------------------------------------------------------------------------------------------ No results found for this basename: CHOL, HDL, LDLCALC, TRIG, CHOLHDL, LDLDIRECT,  in the last 72 hours ------------------------------------------------------------------------------------------------------------------ No results found for this basename: TSH, T4TOTAL, FREET3, T3FREE, THYROIDAB,  in the last 72 hours ------------------------------------------------------------------------------------------------------------------ No results found for this basename: VITAMINB12, FOLATE, FERRITIN, TIBC, IRON, RETICCTPCT,  in the last 72 hours  Coagulation profile  Recent  Labs Lab 05/15/13 0318  INR 1.03    No results found for this basename: DDIMER,  in the last 72 hours  Cardiac Enzymes  Recent Labs Lab 05/14/13 2205 05/15/13 0318 05/15/13 0930  TROPONINI <0.30 <0.30 <0.30   ------------------------------------------------------------------------------------------------------------------ No components found with this basename: POCBNP,      Time  Spent in minutes  45   SINGH,PRASHANT K M.D on 05/15/2013 at 1:32 PM  Between 7am to 7pm - Pager - [phone]  After 7pm go to [email or web address] - password [email or web address]  And look for the night coverage person covering for me after hours  [facility] Office  [phone]

## 2013-05-15 NOTE — Progress Notes (Signed)
Orthostatic vitals complete, please see doc flow sheets. Ambulated with patient in hallway.  Patient ambulated on RA approximately 150 feet using a front wheel walker. Patient Heart rate in 90's when ambulating. 90-95% 02 Sat.  [name]

## 2013-05-16 ENCOUNTER — Inpatient Hospital Stay (HOSPITAL_COMMUNITY): Payer: Medicare Other

## 2013-05-16 DIAGNOSIS — E43 Unspecified severe protein-calorie malnutrition: Secondary | ICD-10-CM | POA: Insufficient documentation

## 2013-05-16 LAB — GLUCOSE, CAPILLARY: Glucose-Capillary: 71 mg/dL (ref 70–99)

## 2013-05-16 LAB — TSH: TSH: 2.041 u[IU]/mL (ref 0.350–4.500)

## 2013-05-16 NOTE — Progress Notes (Signed)
    Subjective:  Denies CP or dyspnea; dizziness resolved   Objective:  Filed Vitals:   05/15/13 1330 05/15/13 2120 05/16/13 0500 05/16/13 0541  BP: 125/63 134/53  140/54  Pulse: 55 62  53  Temp: 98.1 F (36.7 C) 97.3 F (36.3 C)  97.9 F (36.6 C)  TempSrc: Oral Oral  Oral  Resp: 18 18  18   Height:      Weight:   165 lb 14.4 oz (75.252 kg)   SpO2: 97% 97%  96%    Intake/Output from previous day:  Intake/Output Summary (Last 24 hours) at 05/16/13 0753 Last data filed at 05/16/13 0553  Gross per 24 hour  Intake    120 ml  Output    926 ml  Net   -806 ml    Physical Exam: Physical exam: Well-developed chronically ill appearing in no acute distress.  Skin is warm and dry.  HEENT is normal.  Neck is supple.  Chest is clear to auscultation with normal expansion.  Cardiovascular exam is regular but bradycardic Abdominal exam nontender or distended. No masses palpated. Extremities show no edema. neuro grossly intact    Lab Results: Basic Metabolic Panel:  Recent Labs  40/98/11 1705 05/15/13 0318  NA 140 139  K 4.5 4.5  CL 108 107  CO2 22 22  GLUCOSE 105* 95  BUN 31* 32*  CREATININE 1.77* 1.75*  CALCIUM 9.0 8.5   CBC:  Recent Labs  05/14/13 1705 05/15/13 0318  WBC 5.7 4.8  HGB 11.3* 10.9*  HCT 33.4* 32.0*  MCV 91.5 90.1  PLT 181 180   Cardiac Enzymes:  Recent Labs  05/14/13 2205 05/15/13 0318 05/15/13 0930  TROPONINI <0.30 <0.30 <0.30     Assessment/Plan:  1 bradycardia-telemetry has been reviewed. The patient has sinus rhythm with first degree AV block. It is not clear to me that his bradycardia contributed to his dizziness as his blood pressure has been normal and dizziness was prolonged. There are no prolonged pauses. Continue telemetry. If no pulses while in hospital would plan outpatient event monitor. Avoid AV nodal blocking agents. 2 abdominal aortic aneurysm-he will need followup ultrasounds in the future. 3 possible lung cancer  on chest x-ray-management per primary care. 4 dementia 5 chronic stage III kidney disease-follow renal function. 6 coronary artery disease-continue aspirin and statin. 7 thyroid nodule-management per primary care. Olga Millers 05/16/2013, 7:53 AM

## 2013-05-16 NOTE — Evaluation (Signed)
Physical Therapy Evaluation Patient Details Name: [name] MRN: [id number] DOB: 1927-08-09 Today's Date: 05/16/2013 Time: 4098-1191 PT Time Calculation (min): 14 min  PT Assessment / Plan / Recommendation History of Present Illness  Pt adm with dizziness and bradycardia.  Clinical Impression  Pt admitted with above. Pt currently with functional limitations due to the deficits listed below (see PT Problem List).  Pt will benefit from skilled PT to increase their independence and safety with mobility to allow discharge to the venue listed below.       PT Assessment  Patient needs continued PT services    Follow Up Recommendations  Home health PT;Supervision/Assistance - 24 hour (close to 24 hour)    Does the patient have the potential to tolerate intense rehabilitation      Barriers to Discharge        Equipment Recommendations  None recommended by PT    Recommendations for Other Services     Frequency Min 3X/week    Precautions / Restrictions Precautions Precautions: Fall Required Braces or Orthoses: Other Brace/Splint Other Brace/Splint: Wears bil afo's   Pertinent Vitals/Pain VSS      Mobility  Bed Mobility Bed Mobility: Supine to Sit;Sitting - Scoot to Edge of Bed;Sit to Supine Supine to Sit: 6: Modified independent (Device/Increase time);HOB elevated Sitting - Scoot to Edge of Bed: 6: Modified independent (Device/Increase time) Sit to Supine: 6: Modified independent (Device/Increase time);HOB flat Transfers Transfers: Sit to Stand;Stand to Sit Sit to Stand: 4: Min guard;With upper extremity assist;From bed Stand to Sit: 4: Min guard;With upper extremity assist;To bed Details for Transfer Assistance: assist for balance and safety Ambulation/Gait Ambulation/Gait Assistance: 4: Min guard Ambulation Distance (Feet): 200 Feet Assistive device: Rolling walker Ambulation/Gait Assistance Details: verbal cues to stand more erect. Gait Pattern: Step-through  pattern;Decreased stride length;Decreased dorsiflexion - right;Decreased dorsiflexion - left Gait velocity: decr    Exercises     PT Diagnosis: Difficulty walking;Generalized weakness  PT Problem List: Decreased activity tolerance;Decreased balance;Decreased mobility;Decreased knowledge of use of DME;Decreased strength;Decreased safety awareness PT Treatment Interventions: DME instruction;Gait training;Patient/family education;Functional mobility training;Therapeutic activities;Therapeutic exercise;Balance training     PT Goals(Current goals can be found in the care plan section) Acute Rehab PT Goals Patient Stated Goal: return home PT Goal Formulation: With patient Time For Goal Achievement: 05/23/13 Potential to Achieve Goals: Good  Visit Information  Last PT Received On: 05/16/13 Assistance Needed: +1 History of Present Illness: Pt adm with dizziness and bradycardia.       Prior Functioning  Home Living Family/patient expects to be discharged to:: Private residence Available Help at Discharge: Personal care attendant;Available 24 hours/day Type of Home: House Home Access: Stairs to enter [employer] of Steps: 2-3 Home Layout: One level Home Equipment: Walker - 2 wheels;Cane - single point Prior Function Comments: supervision with mobility.    Cognition  Cognition Arousal/Alertness: Awake/alert Behavior During Therapy: WFL for tasks assessed/performed Overall Cognitive Status: History of cognitive impairments - at baseline    Extremity/Trunk Assessment Upper Extremity Assessment Upper Extremity Assessment: Overall WFL for tasks assessed Lower Extremity Assessment Lower Extremity Assessment: RLE deficits/detail;LLE deficits/detail;Generalized weakness RLE Deficits / Details: ankle 0/5 LLE Deficits / Details: ankle 0/5   Balance Balance Balance Assessed: Yes Static Standing Balance Static Standing - Balance Support: Bilateral upper extremity  supported Static Standing - Level of Assistance: 5: Stand by assistance  End of Session PT - End of Session Equipment Utilized During Treatment: Gait belt Activity Tolerance: Patient tolerated treatment well Patient  left: in bed;with call bell/phone within reach;with family/visitor present Nurse Communication: Mobility status  GP     [facility] 05/16/2013, 3:40 PM  [facility] PT [phone]

## 2013-05-16 NOTE — Clinical Social Work Note (Signed)
CSW met with pt and pt's sister at bedside. Pt stated that he would not like to be placed in a SNF and that he would like to return home once medically stable for discharge from [facility]. Pt's sister informed CSW that pt is currently active with [facility] home health agency, and that pt also has a caregiver that is with him from the time that the pt wakes in the morning until 10:30pm every day of the week. Pt's sister stated that pt wears a "life alert" necklace and that pt knows not to get out of bed unless someone is at home with him to help.   CSW followed-up with [facility] about information above. CSW signing off.   Darlyn Chamber, LCSWA Clinical Social Worker [phone]

## 2013-05-16 NOTE — Progress Notes (Signed)
[facility]                                                                                Patient Demographics  [name], is a 79 y.o. male, DOB - 23-Mar-1928, [name]:[id number]  Admit date - 05/14/2013   Admitting Physician Lynden Oxford, MD  Outpatient Primary MD for the patient is [name], Douglas Curt, MD  LOS - 2   Chief Complaint  Patient presents with  . Bradycardia  . Dizziness        Assessment & Plan    1. Dizziness. Question if this is due to symptomatic bradycardia, heart rate upon ambulation along with blood pressure is satisfactory, he's not orthostatic, does not have nystagmus on exam, no signs of vertigo, will have cardiology evaluate, stable echogram with EF 55% grade 1 chronic diastolic dysfunction and no wall motion abnormality, stable TSH, will have PT monitor for balance issues.     2 H/O AAA - in September on his last admission the size was 5.5 cm, at that time case was discussed with his vascular surgeon, Dr. Myra Gianotti, who wanted to follow in the outpatient setting. Stable on repeat ultrasound to make sure AAA size is not worse, will monitor. Not use beta blockers due to issues with bradycardia.     3. Chronic kidney disease stage III. Baseline creatinine is around 1.7. He is at baseline.      4.Coronary artery disease status post CABG 1996 - pain and symptom-free no acute issues.      5. History of right thyroid nodule- will set him up with ENT for possible tissue sampling in the outpatient setting.  Lab Results  Component Value Date   TSH 2.041 05/15/2013      6. History of dementia. He is at baseline no acute issues. Remains at risk for delirium. Continue memantine.     7. Hypertension. Stable on combination of lisinopril and hydralazine     8. Dyslipidemia. Home dose statin continued.     9. Suspicious lung density on chest x-ray. CT chest ordered.      Code Status: Full  Family Communication: None  present  Disposition Plan: SNF   Procedures thyroid ultrasound, abdominal aortic ultrasound   Consults  Cards   Medications  Scheduled Meds: . aspirin EC  81 mg Oral Daily  . feeding supplement (ENSURE COMPLETE)  237 mL Oral BID BM  . heparin  5,000 Units Subcutaneous Q8H  . hydrALAZINE  75 mg Oral TID  . lisinopril  20 mg Oral Daily  . memantine  10 mg Oral BID WC  . mirtazapine  7.5 mg Oral QHS  . simvastatin  40 mg Oral QHS  . sodium chloride  3 mL Intravenous Q12H   Continuous Infusions:  PRN Meds:.ALPRAZolam, ondansetron (ZOFRAN) IV, ondansetron  DVT Prophylaxis  heparin  Lab Results  Component Value Date   PLT 180 05/15/2013    Antibiotics     Anti-infectives   None          Subjective:   [name] today has, No headache, No chest pain, No abdominal pain - No Nausea, No new weakness tingling or numbness, No Cough - SOB.  Objective:   Filed Vitals:   05/15/13 1330 05/15/13 2120 05/16/13 0500 05/16/13 0541  BP: 125/63 134/53  140/54  Pulse: 55 62  53  Temp: 98.1 F (36.7 C) 97.3 F (36.3 C)  97.9 F (36.6 C)  TempSrc: Oral Oral  Oral  Resp: 18 18  18   Height:      Weight:   75.252 kg (165 lb 14.4 oz)   SpO2: 97% 97%  96%    Wt Readings from Last 3 Encounters:  05/16/13 75.252 kg (165 lb 14.4 oz)  04/01/13 74.934 kg (165 lb 3.2 oz)  03/06/13 78.3 kg (172 lb 9.9 oz)     Intake/Output Summary (Last 24 hours) at 05/16/13 1052 Last data filed at 05/16/13 0900  Gross per 24 hour  Intake    360 ml  Output    825 ml  Net   -465 ml    Exam Awake Alert, Oriented X 3, No new F.N deficits, Normal affect [place].AT,PERRAL Supple Neck,No JVD, No cervical lymphadenopathy appriciated.  Symmetrical Chest wall movement, Good air movement bilaterally, CTAB RRR,No Gallops,Rubs or new Murmurs, No Parasternal Heave +ve B.Sounds, Abd Soft, pulsatile AAA, Non tender, No organomegaly appriciated, No rebound - guarding or rigidity. No Cyanosis,  Clubbing or edema, No new Rash or bruise     Data Review   Micro Results Recent Results (from the past 240 hour(s))  MRSA PCR SCREENING     Status: None   Collection Time    05/14/13 11:21 PM      Result Value Range Status   MRSA by PCR NEGATIVE  NEGATIVE Final   Comment:            The GeneXpert MRSA Assay (FDA     approved for NASAL specimens     only), is one component of a     comprehensive MRSA colonization     surveillance program. It is not     intended to diagnose MRSA     infection nor to guide or     monitor treatment for     MRSA infections.    Radiology Reports Ct Abdomen Pelvis Wo Contrast  05/14/2013   CLINICAL DATA:  Abdominal pain, fall 2 weeks ago  EXAM: CT ABDOMEN AND PELVIS WITHOUT CONTRAST  TECHNIQUE: Multidetector CT imaging of the abdomen and pelvis was performed following the standard protocol without intravenous contrast.  COMPARISON:  12/11/2011  FINDINGS: Lung bases are unremarkable. Sagittal images of the spine shows significant disc space flattening with endplate sclerotic changes mild anterior and mild posterior spurring and vacuum disc phenomenon at L3-L4 level. There is disc space flattening with mild posterior spurring at L5-S1 level.  Atherosclerotic calcifications of abdominal aorta, celiac trunk origin, renal artery origin, splenic artery and iliac arteries are noted. SMA calcifications are noted. Again noted aneurysmal dilatation of distal abdominal aorta measures 4.9 by 5 cm in diameter. On the prior exam measures 4.9 x 4.9 cm in diameter. No periaortic fluid or leak is noted.  Unenhanced liver shows no biliary ductal dilatation. No calcified gallstones are noted within gallbladder. Again noted status post partial right hemicolectomy. Unenhanced right kidney is unchanged in size in appearance. No right hydronephrosis. Adrenal glands are unremarkable. The spleen and pancreas are unremarkable.  Left kidney surgically absent.  No small bowel or colonic  obstruction. Moderate stool noted in distal colon. Left colon and sigmoid colon diverticula are noted. No evidence of acute diverticulitis. Again noted atherosclerotic calcifications and aneurysmal dilatation of common  iliac arteries. Right common iliac artery measures 2 cm in diameter. Left common iliac artery measures 1.7 cm in diameter. The urinary bladder is unremarkable. Prostate gland and seminal vesicles are unremarkable. No destructive bony lesions are noted within pelvis. Atherosclerotic calcifications of femoral arteries.  IMPRESSION: 1. Degenerative changes lumbar spine at L3-L4 and L5-S1 level. 2. Again noted infrarenal aneurysmal dilatation of abdominal aorta measures 5 x 4.9 cm on the prior exam measures 4.9 cm. 3. Stable in appearance unenhanced right kidney. No right hydronephrosis. The left kidney is surgically absent. 4. Again noted status post partial right hemicolectomy. No small bowel or colonic obstruction. No adenopathy. 5. Distal colonic diverticula without evidence of acute diverticulitis.   Electronically Signed   By: Natasha Mead M.D.   On: 05/14/2013 16:53   Dg Chest 2 View  05/14/2013   CLINICAL DATA:  Bradycardia.  Dizziness.  EXAM: CHEST  2 VIEW  COMPARISON:  03/06/2013  FINDINGS: Abnormal density overlies the left post for lateral rib fractures. Mildly tortuous thoracic aorta. Biapical pleural parenchymal scarring.  Prior CABG.  Mildly tortuous thoracic aorta.  IMPRESSION: 1. Abnormal density, left upper lobe, suspicious for lung cancer. There were clearly some infectious/ inflammatory densities on the prior chest CT from 03/06/2013, but I am highly suspicious for low grade adenocarcinoma in the left upper lobe given the persistence today. I recommend followup chest CT now with comparison to CT chest exams back through 2007 in order to assess for growth.   Electronically Signed   By: Herbie Baltimore M.D.   On: 05/14/2013 16:21   Ct Head Wo Contrast  05/14/2013   CLINICAL DATA:   Dizziness.  EXAM: CT HEAD WITHOUT CONTRAST  TECHNIQUE: Contiguous axial images were obtained from the base of the skull through the vertex without intravenous contrast.  COMPARISON:  CT scan of March 04, 2013.  FINDINGS: Bony calvarium appears intact. Diffuse cortical atrophy is noted. Chronic ischemic white matter disease is noted. No mass effect or midline shift is noted. Ventricular size is within normal limits. There is no evidence of mass lesion, hemorrhage or acute infarction.  IMPRESSION: Mild diffuse cortical atrophy. Chronic ischemic white matter disease. No acute intracranial abnormality seen.   Electronically Signed   By: Roque Lias M.D.   On: 05/14/2013 16:42    CBC  Recent Labs Lab 05/14/13 1705 05/15/13 0318  WBC 5.7 4.8  HGB 11.3* 10.9*  HCT 33.4* 32.0*  PLT 181 180  MCV 91.5 90.1  MCH 31.0 30.7  MCHC 33.8 34.1  RDW 15.0 14.9    Chemistries   Recent Labs Lab 05/14/13 1705 05/15/13 0318  NA 140 139  K 4.5 4.5  CL 108 107  CO2 22 22  GLUCOSE 105* 95  BUN 31* 32*  CREATININE 1.77* 1.75*  CALCIUM 9.0 8.5  AST 18 19  ALT 12 12  ALKPHOS 69 64  BILITOT 0.2* 0.2*   ------------------------------------------------------------------------------------------------------------------ estimated creatinine clearance is 34.1 ml/min (by C-G formula based on Cr of 1.75). ------------------------------------------------------------------------------------------------------------------ No results found for this basename: HGBA1C,  in the last 72 hours ------------------------------------------------------------------------------------------------------------------ No results found for this basename: CHOL, HDL, LDLCALC, TRIG, CHOLHDL, LDLDIRECT,  in the last 72 hours ------------------------------------------------------------------------------------------------------------------  Recent Labs  05/15/13 1050  TSH 2.041    ------------------------------------------------------------------------------------------------------------------ No results found for this basename: VITAMINB12, FOLATE, FERRITIN, TIBC, IRON, RETICCTPCT,  in the last 72 hours  Coagulation profile  Recent Labs Lab 05/15/13 0318  INR 1.03    No results found for  this basename: DDIMER,  in the last 72 hours  Cardiac Enzymes  Recent Labs Lab 05/14/13 2205 05/15/13 0318 05/15/13 0930  TROPONINI <0.30 <0.30 <0.30   ------------------------------------------------------------------------------------------------------------------ No components found with this basename: POCBNP,      Time Spent in minutes  45   Susa Raring K M.D on 05/16/2013 at 10:52 AM  Between 7am to 7pm - Pager - [phone]  After 7pm go to [email or web address] - password [email or web address]  And look for the night coverage person covering for me after hours  [facility] Office  [phone]

## 2013-05-17 LAB — GLUCOSE, CAPILLARY: Glucose-Capillary: 94 mg/dL (ref 70–99)

## 2013-05-17 MED ORDER — MECLIZINE HCL 12.5 MG PO TABS: 32.0000 mg | ORAL_TABLET | Freq: Three times a day (TID) | ORAL | Status: DC | PRN

## 2013-05-17 MED ORDER — ALPRAZOLAM 0.25 MG PO TABS: 0.2500 mg | ORAL_TABLET | Freq: Every evening | ORAL | Status: DC | PRN

## 2013-05-17 MED ORDER — ENSURE COMPLETE PO LIQD: 237.0000 mL | Freq: Two times a day (BID) | ORAL | Status: DC

## 2013-05-17 NOTE — Progress Notes (Signed)
Patient Name: [name] Date of Encounter: 05/17/2013     Principal Problem:   Symptomatic bradycardia Active Problems:   PERIPHERAL NEUROPATHY   Abdominal aneurysm without mention of rupture   Dementia without behavioral disturbance   Dizziness   Protein-calorie malnutrition, severe    SUBJECTIVE The patient denies any further dizzy spells.  He denies any chest pain or shortness of breath.  CURRENT MEDS . aspirin EC  81 mg Oral Daily  . feeding supplement (ENSURE COMPLETE)  237 mL Oral BID BM  . heparin  5,000 Units Subcutaneous Q8H  . hydrALAZINE  75 mg Oral TID  . lisinopril  20 mg Oral Daily  . memantine  10 mg Oral BID WC  . mirtazapine  7.5 mg Oral QHS  . simvastatin  40 mg Oral QHS  . sodium chloride  3 mL Intravenous Q12H    OBJECTIVE  Filed Vitals:   05/16/13 1345 05/16/13 1644 05/16/13 2122 05/17/13 0422  BP: 132/55 134/63 132/54 133/50  Pulse: 55  53 50  Temp: 98.2 F (36.8 C)  98.2 F (36.8 C) 98.1 F (36.7 C)  TempSrc: Oral  Oral Oral  Resp: 17  16 17   Height:      Weight:    158 lb 14.4 oz (72.077 kg)  SpO2: 97%  97% 96%    Intake/Output Summary (Last 24 hours) at 05/17/13 0928 Last data filed at 05/17/13 0425  Gross per 24 hour  Intake    480 ml  Output    700 ml  Net   -220 ml   Filed Weights   05/15/13 0450 05/16/13 0500 05/17/13 0422  Weight: 157 lb 14.4 oz (71.623 kg) 165 lb 14.4 oz (75.252 kg) 158 lb 14.4 oz (72.077 kg)    PHYSICAL EXAM  General: Pleasant, NAD. Neuro: Alert and oriented X 3. Moves all extremities spontaneously. Psych: Normal affect. HEENT:  Normal  Neck: Supple without bruits or JVD. Lungs:  Resp regular and unlabored, CTA. Heart: RRR no s3, s4, or murmurs. Abdomen: Soft, non-tender, non-distended, BS + x 4.  Pulsatile abdominal aortic aneurysm. Extremities: No clubbing, cyanosis or edema. DP/PT/Radials 2+ and equal bilaterally.  Accessory Clinical Findings  CBC  Recent Labs  05/14/13 1705  05/15/13 0318  WBC 5.7 4.8  HGB 11.3* 10.9*  HCT 33.4* 32.0*  MCV 91.5 90.1  PLT 181 180   Basic Metabolic Panel  Recent Labs  05/14/13 1705 05/15/13 0318  NA 140 139  K 4.5 4.5  CL 108 107  CO2 22 22  GLUCOSE 105* 95  BUN 31* 32*  CREATININE 1.77* 1.75*  CALCIUM 9.0 8.5   Liver Function Tests  Recent Labs  05/14/13 1705 05/15/13 0318  AST 18 19  ALT 12 12  ALKPHOS 69 64  BILITOT 0.2* 0.2*  PROT 6.1 5.8*  ALBUMIN 3.3* 3.1*   No results found for this basename: LIPASE, AMYLASE,  in the last 72 hours Cardiac Enzymes  Recent Labs  05/14/13 2205 05/15/13 0318 05/15/13 0930  TROPONINI <0.30 <0.30 <0.30   BNP No components found with this basename: POCBNP,  D-Dimer No results found for this basename: DDIMER,  in the last 72 hours Hemoglobin A1C No results found for this basename: HGBA1C,  in the last 72 hours Fasting Lipid Panel No results found for this basename: CHOL, HDL, LDLCALC, TRIG, CHOLHDL, LDLDIRECT,  in the last 72 hours Thyroid Function Tests  Recent Labs  05/15/13 1050  TSH 2.041    TELE  Telemetry was reviewed.  Demonstrates normal sinus rhythm with frequent PACs sometimes in atrial bigeminy.  No significant pauses.  No ventricular arrhythmias.  ECG    Radiology/Studies  Ct Abdomen Pelvis Wo Contrast  05/14/2013   CLINICAL DATA:  Abdominal pain, fall 2 weeks ago  EXAM: CT ABDOMEN AND PELVIS WITHOUT CONTRAST  TECHNIQUE: Multidetector CT imaging of the abdomen and pelvis was performed following the standard protocol without intravenous contrast.  COMPARISON:  12/11/2011  FINDINGS: Lung bases are unremarkable. Sagittal images of the spine shows significant disc space flattening with endplate sclerotic changes mild anterior and mild posterior spurring and vacuum disc phenomenon at L3-L4 level. There is disc space flattening with mild posterior spurring at L5-S1 level.  Atherosclerotic calcifications of abdominal aorta, celiac trunk origin,  renal artery origin, splenic artery and iliac arteries are noted. SMA calcifications are noted. Again noted aneurysmal dilatation of distal abdominal aorta measures 4.9 by 5 cm in diameter. On the prior exam measures 4.9 x 4.9 cm in diameter. No periaortic fluid or leak is noted.  Unenhanced liver shows no biliary ductal dilatation. No calcified gallstones are noted within gallbladder. Again noted status post partial right hemicolectomy. Unenhanced right kidney is unchanged in size in appearance. No right hydronephrosis. Adrenal glands are unremarkable. The spleen and pancreas are unremarkable.  Left kidney surgically absent.  No small bowel or colonic obstruction. Moderate stool noted in distal colon. Left colon and sigmoid colon diverticula are noted. No evidence of acute diverticulitis. Again noted atherosclerotic calcifications and aneurysmal dilatation of common iliac arteries. Right common iliac artery measures 2 cm in diameter. Left common iliac artery measures 1.7 cm in diameter. The urinary bladder is unremarkable. Prostate gland and seminal vesicles are unremarkable. No destructive bony lesions are noted within pelvis. Atherosclerotic calcifications of femoral arteries.  IMPRESSION: 1. Degenerative changes lumbar spine at L3-L4 and L5-S1 level. 2. Again noted infrarenal aneurysmal dilatation of abdominal aorta measures 5 x 4.9 cm on the prior exam measures 4.9 cm. 3. Stable in appearance unenhanced right kidney. No right hydronephrosis. The left kidney is surgically absent. 4. Again noted status post partial right hemicolectomy. No small bowel or colonic obstruction. No adenopathy. 5. Distal colonic diverticula without evidence of acute diverticulitis.   Electronically Signed   By: Natasha Mead M.D.   On: 05/14/2013 16:53   Dg Chest 2 View  05/14/2013   CLINICAL DATA:  Bradycardia.  Dizziness.  EXAM: CHEST  2 VIEW  COMPARISON:  03/06/2013  FINDINGS: Abnormal density overlies the left post for lateral rib  fractures. Mildly tortuous thoracic aorta. Biapical pleural parenchymal scarring.  Prior CABG.  Mildly tortuous thoracic aorta.  IMPRESSION: 1. Abnormal density, left upper lobe, suspicious for lung cancer. There were clearly some infectious/ inflammatory densities on the prior chest CT from 03/06/2013, but I am highly suspicious for low grade adenocarcinoma in the left upper lobe given the persistence today. I recommend followup chest CT now with comparison to CT chest exams back through 2007 in order to assess for growth.   Electronically Signed   By: Herbie Baltimore M.D.   On: 05/14/2013 16:21   Ct Head Wo Contrast  05/14/2013   CLINICAL DATA:  Dizziness.  EXAM: CT HEAD WITHOUT CONTRAST  TECHNIQUE: Contiguous axial images were obtained from the base of the skull through the vertex without intravenous contrast.  COMPARISON:  CT scan of March 04, 2013.  FINDINGS: Bony calvarium appears intact. Diffuse cortical atrophy is noted. Chronic ischemic white matter disease  is noted. No mass effect or midline shift is noted. Ventricular size is within normal limits. There is no evidence of mass lesion, hemorrhage or acute infarction.  IMPRESSION: Mild diffuse cortical atrophy. Chronic ischemic white matter disease. No acute intracranial abnormality seen.   Electronically Signed   By: Roque Lias M.D.   On: 05/14/2013 16:42   Ct Chest Wo Contrast  05/16/2013   CLINICAL DATA:  Abnormal chest radiograph.  EXAM: CT CHEST WITHOUT CONTRAST  TECHNIQUE: Multidetector CT imaging of the chest was performed following the standard protocol without IV contrast.  COMPARISON:  Chest CT 03/06/2013; 12/01/2005.  FINDINGS: Re- demonstrated low-attenuation lesion within the posterior aspect of the right thyroid lobe. Normal heart size. Coronary artery calcifications. No pericardial effusion. Normal caliber aorta and main pulmonary artery. No enlarged axillary, mediastinal or hilar lymphadenopathy.  Central airways are patent.  Interval resolution of previously visualized small bilateral pleural effusions and right greater than left lower lobe consolidative and ground-glass pulmonary opacities. Small area measuring up to 1.3 cm of adjacent tree-in-bud nodular opacities persistent within the peripheral right lower lobe (image 38; series 2).  There are 2 ill defined ground-glass opacity lesions within the left upper lobe which have increased in size from more remote priors. The more superior lesion measures 1.7 cm (image 20; series 2), previously barely perceptible measuring approximately 0.4 cm. The more inferior lesion measures 1.7 cm and has also increased in size from prior where it measured approximately 1.1 cm.  Unchanged subpleural 1.1 cm nodular density when compared to recent prior examination, this is new from more remote examinations. Additionally there is a 0.9 cm left lower lobe nodule (image 42) which is grossly stable in size when compared to recent prior examination however increased from more remote priors. Interval increase in size of now 1.1 cm nodule within the left lower lobe (image 31; series 2), previously measuring 0.7 cm.  Minimal focal ground-glass opacity within the right middle lobe slightly improved from recent prior likely representing resolving infectious process. Centrilobular emphysematous change. Persistent probable focal scarring within the left lung apex.  Limited visualization of the upper abdomen demonstrates a 4.8 cm abdominal aortic aneurysm. Bowel anastomosis involving the colon.  Lower spine degenerative change. No aggressive osseous lesions. Old healed left 7th and 6th rib fractures.  IMPRESSION: 1. Two ground-glass nodules within the left upper lobe which are similar when compared to recent priors however have increased in size when compared to more remote prior examinations. Given the findings over time, these are suggestive of low-grade adenocarcinoma. Consider further evaluation. 2. Interval  increase in size of more solid nodule within the left lower lobe when compared to recent prior examination which is nonspecific and may be infectious/inflammatory in etiology however malignancy is not excluded. 3. Significant interval improvement and near complete resolution of previously visualized ground-glass and consolidative opacities within the right greater than left lower lobes. There are some residual tree-in-bud nodular opacities within the right lower and right middle lobes likely resolving infectious process. Additionally there is a subpleural nodular opacity within the left upper lobe which is similar to the most recent prior examination and is nonspecific, potentially infectious/inflammatory in etiology.  4. Abdominal aortic aneurysm.  These results will be called to the ordering clinician or representative by the Radiologist Assistant, and communication documented in the PACS Dashboard.   Electronically Signed   By: Annia Belt M.D.   On: 05/16/2013 15:46   US Soft Tissue Head/neck  05/15/2013   CLINICAL  DATA:  Possible thyroid nodule.  EXAM: THYROID ULTRASOUND  TECHNIQUE: Ultrasound examination of the thyroid gland and adjacent soft tissues was performed.  COMPARISON:  CT cervical spine 03/04/2013  FINDINGS: Right lobe: 5.6 x 3.5 x 1.1  cm  Left lobe: 4.6 x 1.6 x 1.8 cm  Isthmus: 2 mm mm  Nodules: The thyroid is diffusely heterogeneous, especially the right lobe.  A 9 mm minimally complex cystic lesion is identified in the interpolar right lobe.  A hypoechoic solid nodule is identified within the inter/lower pole right lobe. This measures 1.7 x 1.2 x 1.4 cm. No calcifications within.  Lymphadenopathy:  Absent  IMPRESSION: Heterogeneous thyroid echotexture throughout. A solid nodule within the right lobe measures maximally 1.7 cm. Per consensus criteria, this should be considered for tissue sampling. This recommendation follows the consensus statement: Management of Thyroid Nodules Detected at [place]:  Society of Radiologists in Ultrasound Consensus Conference Statement. Radiology 2005; [id number].   Electronically Signed   By: Jeronimo Greaves M.D.   On: 05/15/2013 22:04   US Aorta  05/15/2013   CLINICAL DATA:  History of abdominal aortic aneurysm.  EXAM: ULTRASOUND OF ABDOMINAL AORTA  TECHNIQUE: Ultrasound examination of the abdominal aorta was performed to evaluate for abdominal aortic aneurysm.  COMPARISON:  03/04/2013 ultrasound and 05/14/2013 CT  FINDINGS: Abdominal Aorta  Aneurysmal dilatation. Maximally 4.6 cm anterior/ posterior and 5.1 cm transverse. Infrarenal segment. Given differences in measurement technique, similar to 03/04/2013.  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  Left common iliac at 1.2 cm.  IMPRESSION:  Aneurysmal dilatation of the infrarenal abdominal aorta and right common iliac artery.   Electronically Signed   By: Jeronimo Greaves M.D.   On: 05/15/2013 21:29    ASSESSMENT AND PLAN 1 bradycardia-telemetry has been reviewed. The patient has sinus rhythm with first degree AV block. It is not clear to me that his bradycardia contributed to his dizziness as his blood pressure has been normal and dizziness was prolonged. There are no prolonged pauses.  If no pauses while in hospital would plan outpatient event monitor. Avoid AV nodal blocking agents.  2 abdominal aortic aneurysm-he will need followup ultrasounds in the future.  3 possible lung cancer on chest x-ray-management per primary care.  4 dementia  5 chronic stage III kidney disease-follow renal function.  6 coronary artery disease-continue aspirin and statin.  7 thyroid nodule-management per primary care.  Okay to discharge soon from cardiac standpoint.  Signed, Cassell Clement  MD

## 2013-05-17 NOTE — Discharge Summary (Signed)
[facility]                                                                                   [name], is a 79 y.o. male  DOB [birth year]  MRN [id number].  Admission date:  05/14/2013  Admitting Physician  Lynden Oxford, MD  Discharge Date:  05/17/2013   Primary MD  [name], MD  Recommendations for primary care physician for things to follow:   Please make sure patient follows with the recommended subspecialists listed below   Admission Diagnosis  Symptomatic bradycardia [427.89]  Discharge Diagnosis   vertigo, asymptomatic bradycardia during sleep, abdominal aortic aneurysm, possible lung mass, thyroid nodules  Principal Problem:   Symptomatic bradycardia Active Problems:   PERIPHERAL NEUROPATHY   Abdominal aneurysm without mention of rupture   Dementia without behavioral disturbance   Dizziness   Protein-calorie malnutrition, severe      Past Medical History  Diagnosis Date  . Arthritis   . Depression   . Diabetes mellitus   . Hypertension   . Hyperlipidemia   . Colon cancer   . AAA (abdominal aortic aneurysm) 2011  . CAD (coronary artery disease) 2003  . Anemia   . Diverticula of colon 2013  . Hypertrophy of prostate without urinary obstruction and other lower urinary tract symptoms (LUTS) 2008  . Hyperpotassemia 2008  . Gout, unspecified 2008  . Impacted cerumen 06/20/2006  . First degree atrioventricular block 2008  . Abnormality of gait 2007  . Other specified cardiac dysrhythmias(427.89) 2005  . Unspecified disorder of kidney and ureter 2005  . Edema 2005  . Malignant neoplasm of colon, unspecified site 2004  . Unspecified hereditary and idiopathic peripheral neuropathy 2004  . Chest pain, unspecified 2003  . Pain in joint, pelvic region and thigh 2004  . Pain in limb 2003  . Myocardial infarction 1984  . Exertional shortness of breath   . Pneumonia ~ 02/2013    "hospitalized for double pneumonia" (05/15/2013)  . Anxiety   . Chronic  kidney disease     "born w/only 1 kidney" (05/15/2013)  . Chronic kidney disease (CKD), stage III (moderate)     [name] 05/14/2013 (05/15/2013)  . Postural dizziness     hospitalized 05/14/2013    Past Surgical History  Procedure Laterality Date  . Mediastinotomy    . Right colectomy  2004  . Inguinal hernia repair Right 1952    In [employer]  . Coronary artery bypass graft  1996    Bartle, MD  . Cardiac catheterization    . Colon surgery       Discharge Condition: stable   Follow-up Information   Follow up with GREEN, [name], MD. Schedule an appointment as soon as possible for a visit in 1 week.   Specialty:  Internal Medicine   Contact information:   [address] [place] [address] [phone]       Follow up with Basilio Cairo, Lala Lund, MD. Schedule an appointment as soon as possible for a visit in 1 week.   Specialty:  Vascular Surgery   Contact information:   [address] [place] [address] [phone]  Follow up with Dillard Cannon, MD. Schedule an appointment as soon as possible for a visit in 1 week.   Specialty:  Otolaryngology   Contact information:   [address] [place] [address] [phone]       Follow up with Willa Rough, MD. Schedule an appointment as soon as possible for a visit in 1 week.   Specialty:  Cardiology   Contact information:   [address] [place] [address] [phone]       Follow up with [facility], MD. Schedule an appointment as soon as possible for a visit in 1 week.   Specialty:  Pulmonary Disease   Contact information:   [address] [place] [address] [phone]         Consults obtained - Cards   Discharge Medications      Medication List         ALPRAZolam 0.25 MG tablet  Commonly known as:  XANAX  Take 1 tablet (0.25 mg total) by mouth at bedtime as needed for anxiety.     ARICEPT 10 MG tablet  Generic drug:  donepezil  Take 10 mg by  mouth at bedtime.     aspirin 81 MG tablet  Take 81 mg by mouth daily. Take 1 tablet daily to prevent stroke and heart attack.     feeding supplement (ENSURE COMPLETE) Liqd  Take 237 mLs by mouth 2 (two) times daily between meals.     hydrALAZINE 25 MG tablet  Commonly known as:  APRESOLINE  Take 75 mg by mouth 3 (three) times daily.     lisinopril 20 MG tablet  Commonly known as:  PRINIVIL,ZESTRIL  Take 1 tablet (20 mg total) by mouth daily.     meclizine 12.5 MG tablet  Commonly known as:  ANTIVERT  Take 2.5 tablets (31.25 mg total) by mouth 3 (three) times daily as needed for dizziness.     memantine 10 MG tablet  Commonly known as:  NAMENDA  Take 1 tablet (10 mg total) by mouth 2 (two) times daily.     mirtazapine 7.5 MG tablet  Commonly known as:  REMERON  Take 1 tablet (7.5 mg total) by mouth at bedtime.     simvastatin 40 MG tablet  Commonly known as:  ZOCOR  Take 40 mg by mouth at bedtime. Take 1 tablet once daily to lower cholesterol.         Diet and Activity recommendation: See Discharge Instructions below   Discharge Instructions     Follow with Primary MD GREEN, [name], MD in 7 days   Get CBC, CMP, checked 7 days by Primary MD and again as instructed by your Primary MD. Get a 2 view Chest X ray done next visit.  Get Medicines reviewed and adjusted.  Please request your Prim.MD to go over all Hospital Tests and Procedure/Radiological results at the follow up, please get all Hospital records sent to your Prim MD by signing hospital release before you go home.  Activity: As tolerated with Full fall precautions use walker/cane & assistance as needed   Diet: Heart Healthy  For Heart failure patients - Check your Weight same time everyday, if you gain over 2 pounds, or you develop in leg swelling, experience more shortness of breath or chest pain, call your Primary MD immediately. Follow Cardiac Low Salt Diet and 1.8 lit/day fluid  restriction.  Disposition Home    If you experience worsening of your admission symptoms,  develop shortness of breath, life threatening emergency, suicidal or homicidal thoughts you must seek medical attention immediately by calling 911 or calling your MD immediately  if symptoms less severe.  You Must read complete instructions/literature along with all the possible adverse reactions/side effects for all the Medicines you take and that have been prescribed to you. Take any new Medicines after you have completely understood and accpet all the possible adverse reactions/side effects.   Do not drive and provide baby sitting services if your were admitted for syncope or siezures until you have seen by Primary MD or a Neurologist and advised to do so again.  Do not drive when taking Pain medications.    Do not take more than prescribed Pain, Sleep and Anxiety Medications  Special Instructions: If you have smoked or chewed Tobacco  in the last 2 yrs please stop smoking, stop any regular Alcohol  and or any Recreational drug use.  Wear Seat belts while driving.   Please note  You were cared for by a hospitalist during your hospital stay. If you have any questions about your discharge medications or the care you received while you were in the hospital after you are discharged, you can call the unit and asked to speak with the hospitalist on call if the hospitalist that took care of you is not available. Once you are discharged, your primary care physician will handle any further medical issues. Please note that NO REFILLS for any discharge medications will be authorized once you are discharged, as it is imperative that you return to your primary care physician (or establish a relationship with a primary care physician if you do not have one) for your aftercare needs so that they can reassess your need for medications and monitor your lab values.   Major procedures and Radiology Reports - PLEASE  review detailed and final reports for all details, in brief -    Echo  - Left ventricle: The cavity size was normal. There was mild concentric hypertrophy. Systolic function was normal. The estimated ejection fraction was in the range of 55% to 60%. Wall motion was normal; there were no regional wall motion abnormalities. Doppler parameters are consistent with abnormal left ventricular relaxation (grade 1 diastolic dysfunction). - Left atrium: The atrium was mildly dilated. - Right ventricle: The cavity size was mildly dilated. - Atrial septum: No defect or patent foramen ovale was identified.     Ct Abdomen Pelvis Wo Contrast  05/14/2013   CLINICAL DATA:  Abdominal pain, fall 2 weeks ago  EXAM: CT ABDOMEN AND PELVIS WITHOUT CONTRAST  TECHNIQUE: Multidetector CT imaging of the abdomen and pelvis was performed following the standard protocol without intravenous contrast.  COMPARISON:  12/11/2011  FINDINGS: Lung bases are unremarkable. Sagittal images of the spine shows significant disc space flattening with endplate sclerotic changes mild anterior and mild posterior spurring and vacuum disc phenomenon at L3-L4 level. There is disc space flattening with mild posterior spurring at L5-S1 level.  Atherosclerotic calcifications of abdominal aorta, celiac trunk origin, renal artery origin, splenic artery and iliac arteries are noted. SMA calcifications are noted. Again noted aneurysmal dilatation of distal abdominal aorta measures 4.9 by 5 cm in diameter. On the prior exam measures 4.9 x 4.9 cm in diameter. No periaortic fluid or leak is noted.  Unenhanced liver shows no biliary ductal dilatation. No calcified gallstones are noted within gallbladder. Again noted status post partial right hemicolectomy. Unenhanced right kidney is unchanged in size in appearance. No right  hydronephrosis. Adrenal glands are unremarkable. The spleen and pancreas are unremarkable.  Left kidney surgically absent.  No small  bowel or colonic obstruction. Moderate stool noted in distal colon. Left colon and sigmoid colon diverticula are noted. No evidence of acute diverticulitis. Again noted atherosclerotic calcifications and aneurysmal dilatation of common iliac arteries. Right common iliac artery measures 2 cm in diameter. Left common iliac artery measures 1.7 cm in diameter. The urinary bladder is unremarkable. Prostate gland and seminal vesicles are unremarkable. No destructive bony lesions are noted within pelvis. Atherosclerotic calcifications of femoral arteries.  IMPRESSION: 1. Degenerative changes lumbar spine at L3-L4 and L5-S1 level. 2. Again noted infrarenal aneurysmal dilatation of abdominal aorta measures 5 x 4.9 cm on the prior exam measures 4.9 cm. 3. Stable in appearance unenhanced right kidney. No right hydronephrosis. The left kidney is surgically absent. 4. Again noted status post partial right hemicolectomy. No small bowel or colonic obstruction. No adenopathy. 5. Distal colonic diverticula without evidence of acute diverticulitis.   Electronically Signed   By: Natasha Mead M.D.   On: 05/14/2013 16:53   Dg Chest 2 View  05/14/2013   CLINICAL DATA:  Bradycardia.  Dizziness.  EXAM: CHEST  2 VIEW  COMPARISON:  03/06/2013  FINDINGS: Abnormal density overlies the left post for lateral rib fractures. Mildly tortuous thoracic aorta. Biapical pleural parenchymal scarring.  Prior CABG.  Mildly tortuous thoracic aorta.  IMPRESSION: 1. Abnormal density, left upper lobe, suspicious for lung cancer. There were clearly some infectious/ inflammatory densities on the prior chest CT from 03/06/2013, but I am highly suspicious for low grade adenocarcinoma in the left upper lobe given the persistence today. I recommend followup chest CT now with comparison to CT chest exams back through 2007 in order to assess for growth.   Electronically Signed   By: Herbie Baltimore M.D.   On: 05/14/2013 16:21   Ct Head Wo Contrast  05/14/2013    CLINICAL DATA:  Dizziness.  EXAM: CT HEAD WITHOUT CONTRAST  TECHNIQUE: Contiguous axial images were obtained from the base of the skull through the vertex without intravenous contrast.  COMPARISON:  CT scan of March 04, 2013.  FINDINGS: Bony calvarium appears intact. Diffuse cortical atrophy is noted. Chronic ischemic white matter disease is noted. No mass effect or midline shift is noted. Ventricular size is within normal limits. There is no evidence of mass lesion, hemorrhage or acute infarction.  IMPRESSION: Mild diffuse cortical atrophy. Chronic ischemic white matter disease. No acute intracranial abnormality seen.   Electronically Signed   By: Roque Lias M.D.   On: 05/14/2013 16:42   Ct Chest Wo Contrast  05/16/2013   CLINICAL DATA:  Abnormal chest radiograph.  EXAM: CT CHEST WITHOUT CONTRAST  TECHNIQUE: Multidetector CT imaging of the chest was performed following the standard protocol without IV contrast.  COMPARISON:  Chest CT 03/06/2013; 12/01/2005.  FINDINGS: Re- demonstrated low-attenuation lesion within the posterior aspect of the right thyroid lobe. Normal heart size. Coronary artery calcifications. No pericardial effusion. Normal caliber aorta and main pulmonary artery. No enlarged axillary, mediastinal or hilar lymphadenopathy.  Central airways are patent. Interval resolution of previously visualized small bilateral pleural effusions and right greater than left lower lobe consolidative and ground-glass pulmonary opacities. Small area measuring up to 1.3 cm of adjacent tree-in-bud nodular opacities persistent within the peripheral right lower lobe (image 38; series 2).  There are 2 ill defined ground-glass opacity lesions within the left upper lobe which have increased in size from more  remote priors. The more superior lesion measures 1.7 cm (image 20; series 2), previously barely perceptible measuring approximately 0.4 cm. The more inferior lesion measures 1.7 cm and has also increased in  size from prior where it measured approximately 1.1 cm.  Unchanged subpleural 1.1 cm nodular density when compared to recent prior examination, this is new from more remote examinations. Additionally there is a 0.9 cm left lower lobe nodule (image 42) which is grossly stable in size when compared to recent prior examination however increased from more remote priors. Interval increase in size of now 1.1 cm nodule within the left lower lobe (image 31; series 2), previously measuring 0.7 cm.  Minimal focal ground-glass opacity within the right middle lobe slightly improved from recent prior likely representing resolving infectious process. Centrilobular emphysematous change. Persistent probable focal scarring within the left lung apex.  Limited visualization of the upper abdomen demonstrates a 4.8 cm abdominal aortic aneurysm. Bowel anastomosis involving the colon.  Lower spine degenerative change. No aggressive osseous lesions. Old healed left 7th and 6th rib fractures.  IMPRESSION: 1. Two ground-glass nodules within the left upper lobe which are similar when compared to recent priors however have increased in size when compared to more remote prior examinations. Given the findings over time, these are suggestive of low-grade adenocarcinoma. Consider further evaluation. 2. Interval increase in size of more solid nodule within the left lower lobe when compared to recent prior examination which is nonspecific and may be infectious/inflammatory in etiology however malignancy is not excluded. 3. Significant interval improvement and near complete resolution of previously visualized ground-glass and consolidative opacities within the right greater than left lower lobes. There are some residual tree-in-bud nodular opacities within the right lower and right middle lobes likely resolving infectious process. Additionally there is a subpleural nodular opacity within the left upper lobe which is similar to the most recent prior  examination and is nonspecific, potentially infectious/inflammatory in etiology.  4. Abdominal aortic aneurysm.  These results will be called to the ordering clinician or representative by the Radiologist Assistant, and communication documented in the PACS Dashboard.   Electronically Signed   By: Annia Belt M.D.   On: 05/16/2013 15:46   US Soft Tissue Head/neck  05/15/2013   CLINICAL DATA:  Possible thyroid nodule.  EXAM: THYROID ULTRASOUND  TECHNIQUE: Ultrasound examination of the thyroid gland and adjacent soft tissues was performed.  COMPARISON:  CT cervical spine 03/04/2013  FINDINGS: Right lobe: 5.6 x 3.5 x 1.1  cm  Left lobe: 4.6 x 1.6 x 1.8 cm  Isthmus: 2 mm mm  Nodules: The thyroid is diffusely heterogeneous, especially the right lobe.  A 9 mm minimally complex cystic lesion is identified in the interpolar right lobe.  A hypoechoic solid nodule is identified within the inter/lower pole right lobe. This measures 1.7 x 1.2 x 1.4 cm. No calcifications within.  Lymphadenopathy:  Absent  IMPRESSION: Heterogeneous thyroid echotexture throughout. A solid nodule within the right lobe measures maximally 1.7 cm. Per consensus criteria, this should be considered for tissue sampling. This recommendation follows the consensus statement: Management of Thyroid Nodules Detected at [place]: Society of Radiologists in Ultrasound Consensus Conference Statement. Radiology 2005; [id number].   Electronically Signed   By: Jeronimo Greaves M.D.   On: 05/15/2013 22:04   US Aorta  05/15/2013   CLINICAL DATA:  History of abdominal aortic aneurysm.  EXAM: ULTRASOUND OF ABDOMINAL AORTA  TECHNIQUE: Ultrasound examination of the abdominal aorta was performed to evaluate for abdominal aortic  aneurysm.  COMPARISON:  03/04/2013 ultrasound and 05/14/2013 CT  FINDINGS: Abdominal Aorta  Aneurysmal dilatation. Maximally 4.6 cm anterior/ posterior and 5.1 cm transverse. Infrarenal segment. Given differences in measurement technique, similar to  03/04/2013.  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  Left common iliac at 1.2 cm.  IMPRESSION:  Aneurysmal dilatation of the infrarenal abdominal aorta and right common iliac artery.   Electronically Signed   By: Jeronimo Greaves M.D.   On: 05/15/2013 21:29    Micro Results      Recent Results (from the past 240 hour(s))  MRSA PCR SCREENING     Status: None   Collection Time    05/14/13 11:21 PM      Result Value Range Status   MRSA by PCR NEGATIVE  NEGATIVE Final   Comment:            The GeneXpert MRSA Assay (FDA     approved for NASAL specimens     only), is one component of a     comprehensive MRSA colonization     surveillance program. It is not     intended to diagnose MRSA     infection nor to guide or     monitor treatment for     MRSA infections.     History of present illness and  Hospital Course:     Kindly see H&P for history of present illness and admission details, please review complete Labs, Consult reports and Test reports for all details in brief [name], is a 79 y.o. male, patient with history of   thyroid nodules, chronic kidney disease stage III, CAD, abdominal aortic aneurysm, hypertension, poor compliance with followups and medications was admitted to the hospital with chief complaints of dizziness/vertigo, this was found to be more consistent with vertigo arising from in her ear issues. Patient does have some resting bradycardia especially while he is in the bed, however he has excellent: Chonotropic response upon walking with good arise in heart rate and blood pressure. He was seen here by cardiology who recommended outpatient followup with cardiology and no further inpatient cardiac workup. His echogram was stable. TSH was stable.   During his workup patient has been noted to have AAA, thyroid nodules, possible lung mass. These issues were found last admission to and he was advised to followup with vascular surgery, ENT and pulmonary however this could  not be achieved as he was sent to nursing home. Have personally requested both patient and his sister and given them written information on these follow ups, I have reinforced that these followups are extremely important as he could have hidden malignancy and could need operative intervention. They have verbalized understanding and promised me that they will make these followups.   He is symptom-free he does not have any dizziness or vertigo, no chest abdominal pain no shortness of breath. He is able to ambulate with minimal assistance with a walker and will be discharged home with a walker along with home health PT, RN, tied and social worker. Was discussed with his nearest relative his sister.     Today   Subjective:   [name] today has no headache,no chest abdominal pain,no new weakness tingling or numbness, feels much better wants to go home today.    Objective:   Blood pressure 133/50, pulse 50, temperature 98.1 F (36.7 C), temperature source Oral, resp. rate 17, height 6' (1.829 m), weight 72.077 kg (158 lb 14.4 oz), SpO2 96.00%.  Intake/Output Summary (Last 24 hours) at 05/17/13 1037 Last data filed at 05/17/13 0900  Gross per 24 hour  Intake    600 ml  Output   1000 ml  Net   -400 ml    Exam Awake Alert, Oriented *3, No new F.N deficits, Normal affect [place].AT,PERRAL Supple Neck,No JVD, No cervical lymphadenopathy appriciated.  Symmetrical Chest wall movement, Good air movement bilaterally, CTAB RRR,No Gallops,Rubs or new Murmurs, No Parasternal Heave +ve B.Sounds, Abd Soft, Non tender, No organomegaly appriciated, No rebound -guarding or rigidity. No Cyanosis, Clubbing or edema, No new Rash or bruise  Data Review   CBC w Diff: Lab Results  Component Value Date   WBC 4.8 05/15/2013   WBC 5.3 11/20/2012   WBC 6.3 06/28/2007   HGB 10.9* 05/15/2013   HGB 14.4 06/28/2007   HCT 32.0* 05/15/2013   HCT 42.0 06/28/2007   PLT 180 05/15/2013   PLT 164 06/28/2007    LYMPHOPCT 3* 03/04/2013   LYMPHOPCT 23.1 06/28/2007   MONOPCT 4 03/04/2013   MONOPCT 5.1 06/28/2007   EOSPCT 0 03/04/2013   EOSPCT 3.0 06/28/2007   BASOPCT 0 03/04/2013   BASOPCT 0.3 06/28/2007    CMP: Lab Results  Component Value Date   NA 139 05/15/2013   NA 142 11/20/2012   K 4.5 05/15/2013   CL 107 05/15/2013   CO2 22 05/15/2013   BUN 32* 05/15/2013   BUN 30* 11/20/2012   CREATININE 1.75* 05/15/2013   CREATININE 1.90* 05/29/2011   PROT 5.8* 05/15/2013   ALBUMIN 3.1* 05/15/2013   BILITOT 0.2* 05/15/2013   ALKPHOS 64 05/15/2013   AST 19 05/15/2013   ALT 12 05/15/2013  . Lab Results  Component Value Date   TSH 2.041 05/15/2013    Lab Results  Component Value Date   CKTOTAL 281* 03/06/2013   TROPONINI <0.30 05/15/2013     Total Time in preparing paper work, data evaluation and todays exam - 35 minutes  Leroy Sea M.D on 05/17/2013 at 10:37 AM  [facility] Office  [phone]

## 2013-05-19 ENCOUNTER — Telehealth: Payer: Self-pay | Admitting: Internal Medicine

## 2013-05-19 DIAGNOSIS — R262 Difficulty in walking, not elsewhere classified: Secondary | ICD-10-CM

## 2013-05-19 DIAGNOSIS — I129 Hypertensive chronic kidney disease with stage 1 through stage 4 chronic kidney disease, or unspecified chronic kidney disease: Secondary | ICD-10-CM

## 2013-05-19 DIAGNOSIS — E119 Type 2 diabetes mellitus without complications: Secondary | ICD-10-CM

## 2013-05-19 DIAGNOSIS — N181 Chronic kidney disease, stage 1: Secondary | ICD-10-CM

## 2013-05-19 NOTE — Telephone Encounter (Signed)
Follow up with RAMASWAMY,MURALI, MD. Schedule an appointment as soon as possible for a visit in 1 week.    MR, is it okay to schedule with TP? thanks

## 2013-05-20 NOTE — Telephone Encounter (Signed)
I called and spoke with [name]. She scheduled an appt with RB next week. Nothing further needed

## 2013-05-20 NOTE — Telephone Encounter (Signed)
He will be new to our office. So TP cannot see him I think per protocol. AS long as he is seen by me next few weeks is fine based on CT report below . [facility], DR Delton Coombes who is our nodule specialist   Dr. Kalman Shan, M.D., [facility].C.P Pulmonary and Critical Care Medicine Staff Physician [facility] [place] [facility] Pager: [phone], If no answer or between  15:00h - 7:00h: call [phone]  05/20/2013 1:50 PM         CLINICAL DATA: Abnormal chest radiograph.  EXAM:  CT CHEST WITHOUT CONTRAST  TECHNIQUE:  Multidetector CT imaging of the chest was performed following the  standard protocol without IV contrast.  COMPARISON: Chest CT 03/06/2013; 12/01/2005.  FINDINGS:  Re- demonstrated low-attenuation lesion within the posterior aspect  of the right thyroid lobe. Normal heart size. Coronary artery  calcifications. No pericardial effusion. Normal caliber aorta and  main pulmonary artery. No enlarged axillary, mediastinal or hilar  lymphadenopathy.  Central airways are patent. Interval resolution of previously  visualized small bilateral pleural effusions and right greater than  left lower lobe consolidative and ground-glass pulmonary opacities.  Small area measuring up to 1.3 cm of adjacent tree-in-bud nodular  opacities persistent within the peripheral right lower lobe (image  38; series 2).  There are 2 ill defined ground-glass opacity lesions within the left  upper lobe which have increased in size from more remote priors. The  more superior lesion measures 1.7 cm (image 20; series 2),  previously barely perceptible measuring approximately 0.4 cm. The  more inferior lesion measures 1.7 cm and has also increased in size  from prior where it measured approximately 1.1 cm.  Unchanged subpleural 1.1 cm nodular density when compared to recent  prior examination, this is new from more remote examinations.  Additionally there is a 0.9 cm left  lower lobe nodule (image 42)  which is grossly stable in size when compared to recent prior  examination however increased from more remote priors. Interval  increase in size of now 1.1 cm nodule within the left lower lobe  (image 31; series 2), previously measuring 0.7 cm.  Minimal focal ground-glass opacity within the right middle lobe  slightly improved from recent prior likely representing resolving  infectious process. Centrilobular emphysematous change. Persistent  probable focal scarring within the left lung apex.  Limited visualization of the upper abdomen demonstrates a 4.8 cm  abdominal aortic aneurysm. Bowel anastomosis involving the colon.  Lower spine degenerative change. No aggressive osseous lesions. Old  healed left 7th and 6th rib fractures.  IMPRESSION:  1. Two ground-glass nodules within the left upper lobe which are  similar when compared to recent priors however have increased in  size when compared to more remote prior examinations. Given the  findings over time, these are suggestive of low-grade  adenocarcinoma. Consider further evaluation.  2. Interval increase in size of more solid nodule within the left  lower lobe when compared to recent prior examination which is  nonspecific and may be infectious/inflammatory in etiology however  malignancy is not excluded.  3. Significant interval improvement and near complete resolution of  previously visualized ground-glass and consolidative opacities  within the right greater than left lower lobes. There are some  residual tree-in-bud nodular opacities within the right lower and  right middle lobes likely resolving infectious process. Additionally  there is a subpleural nodular opacity within the left upper lobe  which is  similar to the most recent prior examination and is  nonspecific, potentially infectious/inflammatory in etiology.  4. Abdominal aortic aneurysm.  These results will be called to the ordering  clinician or  representative by the Radiologist Assistant, and communication  documented in the PACS Dashboard.  Electronically Signed  By: Annia Belt M.D.  On: 05/16/2013 15:46

## 2013-05-23 ENCOUNTER — Encounter: Payer: Self-pay | Admitting: Surgery

## 2013-05-23 ENCOUNTER — Telehealth: Payer: Self-pay

## 2013-05-23 NOTE — Telephone Encounter (Signed)
Message left on triage VM, patient will now receive home health care through [facility]. Patient will get help with medications and DM management, please call to further discuss.   I called Huntley Dec with [facility] and she indicated patient will see [place] on 05/26/2013. Huntley Dec would like for [place] to fax her a copy of medication list once OV completed. Patient is currently off DM medication(s), per hospital discharge.  Fax Number to [place] location: [phone]

## 2013-05-26 ENCOUNTER — Ambulatory Visit (INDEPENDENT_AMBULATORY_CARE_PROVIDER_SITE_OTHER): Payer: Medicare Other | Admitting: Surgery

## 2013-05-26 ENCOUNTER — Ambulatory Visit (INDEPENDENT_AMBULATORY_CARE_PROVIDER_SITE_OTHER): Payer: Medicare Other | Admitting: Internal Medicine

## 2013-05-26 ENCOUNTER — Encounter: Payer: Self-pay | Admitting: Internal Medicine

## 2013-05-26 ENCOUNTER — Encounter: Payer: Self-pay | Admitting: Surgery

## 2013-05-26 VITALS — BP 150/76 | HR 60 | Temp 97.6°F | Wt 173.0 lb

## 2013-05-26 VITALS — BP 189/71 | HR 48 | Resp 16 | Ht 72.0 in | Wt 172.0 lb

## 2013-05-26 DIAGNOSIS — I498 Other specified cardiac arrhythmias: Secondary | ICD-10-CM

## 2013-05-26 DIAGNOSIS — F0390 Unspecified dementia without behavioral disturbance: Secondary | ICD-10-CM

## 2013-05-26 DIAGNOSIS — F4323 Adjustment disorder with mixed anxiety and depressed mood: Secondary | ICD-10-CM

## 2013-05-26 DIAGNOSIS — E1149 Type 2 diabetes mellitus with other diabetic neurological complication: Secondary | ICD-10-CM

## 2013-05-26 DIAGNOSIS — I714 Abdominal aortic aneurysm, without rupture, unspecified: Secondary | ICD-10-CM

## 2013-05-26 DIAGNOSIS — R001 Bradycardia, unspecified: Secondary | ICD-10-CM

## 2013-05-26 DIAGNOSIS — R918 Other nonspecific abnormal finding of lung field: Secondary | ICD-10-CM

## 2013-05-26 NOTE — Progress Notes (Signed)
Patient name: [name] MRN: [id number] DOB: Apr 27, 1928 Sex: male    No chief complaint on file.   HISTORY OF PRESENT ILLNESS: The patient comes back today for followup of his abdominal aortic aneurysm.  He was recently discharged from the hospital for tachycardia.  He was in the hospital several months ago for bilateral pneumonia.  The patient continues to take a statin for his hypercholesterolemia.  He has a history of chronic renal insufficiency.  He only has one kidney.  Most recent creatinine was 1.7.  Past Medical History  Diagnosis Date  . Arthritis   . Depression   . Diabetes mellitus   . Hypertension   . Hyperlipidemia   . Colon cancer   . AAA (abdominal aortic aneurysm) 2011  . CAD (coronary artery disease) 2003  . Anemia   . Diverticula of colon 2013  . Hypertrophy of prostate without urinary obstruction and other lower urinary tract symptoms (LUTS) 2008  . Hyperpotassemia 2008  . Gout, unspecified 2008  . Impacted cerumen 06/20/2006  . First degree atrioventricular block 2008  . Abnormality of gait 2007  . Other specified cardiac dysrhythmias(427.89) 2005  . Unspecified disorder of kidney and ureter 2005  . Edema 2005  . Malignant neoplasm of colon, unspecified site 2004  . Unspecified hereditary and idiopathic peripheral neuropathy 2004  . Chest pain, unspecified 2003  . Pain in joint, pelvic region and thigh 2004  . Pain in limb 2003  . Myocardial infarction 1984  . Exertional shortness of breath   . Pneumonia ~ 02/2013    "hospitalized for double pneumonia" (05/15/2013)  . Anxiety   . Chronic kidney disease     "born w/only 1 kidney" (05/15/2013)  . Chronic kidney disease (CKD), stage III (moderate)     [name] 05/14/2013 (05/15/2013)  . Postural dizziness     hospitalized 05/14/2013    Past Surgical History  Procedure Laterality Date  . Mediastinotomy    . Right colectomy  2004  . Inguinal hernia repair Right 1952    In [employer]  . Coronary  artery bypass graft  1996    Bartle, [place]  . Cardiac catheterization    . Colon surgery      History   Social History  . Marital Status: Single    Spouse Name: N/A    Number of Children: 0  . Years of Education: N/A   Occupational History  .     Social History Main Topics  . Smoking status: Former Smoker -- 1.00 packs/day for 35 years    Types: Cigarettes    Quit date: 06/12/1982  . Smokeless tobacco: Former Neurosurgeon    Types: Snuff  . Alcohol Use: Yes     Comment: 05/15/2013 "have a beer sometimes; not very often"  . Drug Use: No  . Sexual Activity: No   Other Topics Concern  . Not on file   Social History Narrative  . No narrative on file    Family History  Problem Relation Age of Onset  . Heart disease Father   . Pancreatic cancer Sister   . Cancer Sister     liver  . Cancer Brother     Leukemia  . Cancer Mother     ?    Allergies as of 05/26/2013 - Review Complete 05/26/2013  Allergen Reaction Noted  . Diovan [valsartan] Other (See Comments) 11/19/2012    Current Outpatient Prescriptions on File Prior to Visit  Medication Sig Dispense Refill  .  ALPRAZolam (XANAX) 0.25 MG tablet Take 1 tablet (0.25 mg total) by mouth at bedtime as needed for anxiety.  60 tablet  5  . aspirin 81 MG tablet Take 81 mg by mouth daily. Take 1 tablet daily to prevent stroke and heart attack.      . donepezil (ARICEPT) 10 MG tablet Take 10 mg by mouth at bedtime.      . feeding supplement, ENSURE COMPLETE, (ENSURE COMPLETE) LIQD Take 237 mLs by mouth 2 (two) times daily between meals.  30 Bottle  1  . hydrALAZINE (APRESOLINE) 25 MG tablet Take 75 mg by mouth 3 (three) times daily.      Marland Kitchen lisinopril (PRINIVIL,ZESTRIL) 20 MG tablet Take 1 tablet (20 mg total) by mouth daily.  30 tablet  6  . meclizine (ANTIVERT) 12.5 MG tablet Take 2.5 tablets (31.25 mg total) by mouth 3 (three) times daily as needed for dizziness.  30 tablet  0  . memantine (NAMENDA) 10 MG tablet Take 1 tablet (10 mg  total) by mouth 2 (two) times daily.      . mirtazapine (REMERON) 7.5 MG tablet Take 1 tablet (7.5 mg total) by mouth at bedtime.  30 tablet  3  . simvastatin (ZOCOR) 40 MG tablet Take 40 mg by mouth at bedtime. Take 1 tablet once daily to lower cholesterol.       No current facility-administered medications on file prior to visit.     REVIEW OF SYSTEMS: Please see history of present illness, otherwise all systems negative  PHYSICAL EXAMINATION:   Vital signs are There were no vitals taken for this visit. General: The patient appears their stated age. HEENT:  No gross abnormalities Pulmonary:  Non labored breathing Abdomen: Soft and non-tender.  Aneurysm is nontender. Musculoskeletal: There are no major deformities. Neurologic: No focal weakness or paresthesias are detected, Skin: There are no ulcer or rashes noted. Psychiatric: The patient has normal affect. Cardiovascular: There is a regular rate and rhythm without significant murmur appreciated.  No carotid bruits.  Palpable femoral pulses.   Diagnostic Studies I have reviewed his CT scan from 05/15/2013.  This reveals a 5.0 x 4.9 cm infrarenal abdominal aortic aneurysm.  Assessment: Abdominal aortic aneurysm Plan: Maximum diameter of the patient's aneurysm is 5 cm.  I had previously contemplated endovascular repair at this size, however with the patient's recent medical issues including 2 hospitalizations, I feel it would be in his best interest to delay any operation at this time.  I discussed with the family that this has been relatively stable for some time now.  I currently feel that his risk of a complication from surgery outweighs the benefit of proceeding with repair.  I have elected to have him return for followup in 6 months with a CT scan with fine cuts, without IV contrast.  Based on any growth of his aneurysm, I will contemplate repair.  Jorge Ny, M.D. [facility] [place] Office:  [phone] Pager:  [phone]

## 2013-05-26 NOTE — Progress Notes (Signed)
Patient ID: [name], male   DOB: Dec 26, 1927, 79 y.o.   MRN: [id number]   Location:  [facility] [place] [facility] Office  Code Status: full code--discussed with pt;  Does having living will which says he would not want aggressive treatments   Allergies  Allergen Reactions  . Diovan [Valsartan] Other (See Comments)    Unknown     Chief Complaint  Patient presents with  . Hospitalization Follow-up    hospital f/u [facility]  . other    red area on his LT forearm where IV was.  . Immunizations    would like RX for shingles    HPI: Patient is a 79 y.o. white male seen in the office today for hospital f/u after two hospital stays:  Severe pneumonia and bradycardia.    Feels sleepy today.  Had to get up for an 8am appt.  Doesn't sleep well at night, but sleeps most of the morning.   Not hurting.  Breathing is like normal he says.  Does not feel dyspneic like he did at [facility], but says he hasn't "done nothing".  Not coughing much.   Not feeling dizzy or lightheaded.  No pains in chest.  No falls lately.   Left cheek basal cell for removal.  Had prior colon ca s/p xrt and chemo.  At that time stopped his chemo due to poor results.  Did complete treatments at lower dose.    Review of Systems:  Review of Systems  Constitutional: Positive for malaise/fatigue. Negative for fever and chills.       Down only 2 lbs since sept visit  Respiratory: Positive for shortness of breath. Negative for cough and wheezing.   Cardiovascular: Negative for chest pain.  Gastrointestinal: Negative for heartburn, nausea, vomiting, abdominal pain, diarrhea, constipation, blood in stool and melena.  Genitourinary: Negative for dysuria.  Musculoskeletal: Negative for falls, joint pain and myalgias.  Skin: Negative for rash.       Dry skin, sore place left arm with bruise from IV site  Neurological: Negative for dizziness, loss of consciousness and headaches.    Endo/Heme/Allergies: Bruises/bleeds easily.  Psychiatric/Behavioral: Positive for depression and memory loss.   Past Medical History  Diagnosis Date  . Arthritis   . Depression   . Diabetes mellitus   . Hypertension   . Hyperlipidemia   . Colon cancer   . AAA (abdominal aortic aneurysm) 2011  . CAD (coronary artery disease) 2003  . Anemia   . Diverticula of colon 2013  . Hypertrophy of prostate without urinary obstruction and other lower urinary tract symptoms (LUTS) 2008  . Hyperpotassemia 2008  . Gout, unspecified 2008  . Impacted cerumen 06/20/2006  . First degree atrioventricular block 2008  . Abnormality of gait 2007  . Other specified cardiac dysrhythmias(427.89) 2005  . Unspecified disorder of kidney and ureter 2005  . Edema 2005  . Malignant neoplasm of colon, unspecified site 2004  . Unspecified hereditary and idiopathic peripheral neuropathy 2004  . Chest pain, unspecified 2003  . Pain in joint, pelvic region and thigh 2004  . Pain in limb 2003  . Myocardial infarction 1984  . Exertional shortness of breath   . Pneumonia ~ 02/2013    "hospitalized for double pneumonia" (05/15/2013)  . Anxiety   . Chronic kidney disease     "born w/only 1 kidney" (05/15/2013)  . Chronic kidney disease (CKD), stage III (moderate)     [name] 05/14/2013 (05/15/2013)  . Postural dizziness  hospitalized 05/14/2013    Past Surgical History  Procedure Laterality Date  . Mediastinotomy    . Right colectomy  2004  . Inguinal hernia repair Right 1952    In [employer]  . Coronary artery bypass graft  1996    Bartle, [place]  . Cardiac catheterization    . Colon surgery      Social History:   reports that he quit smoking about 30 years ago. His smoking use included Cigarettes. He has a 35 pack-year smoking history. He has quit using smokeless tobacco. His smokeless tobacco use included Snuff. He reports that he drinks alcohol. He reports that he does not use illicit drugs.  Family History   Problem Relation Age of Onset  . Heart disease Father   . Pancreatic cancer Sister   . Cancer Sister     liver  . Cancer Brother     Leukemia  . Cancer Mother     ?    Medications: Patient's Medications  New Prescriptions   No medications on file  Previous Medications   ALPRAZOLAM (XANAX) 0.25 MG TABLET    Take 1 tablet (0.25 mg total) by mouth at bedtime as needed for anxiety.   ASPIRIN 81 MG TABLET    Take 81 mg by mouth daily. Take 1 tablet daily to prevent stroke and heart attack.   DONEPEZIL (ARICEPT) 10 MG TABLET    Take 10 mg by mouth at bedtime.   FEEDING SUPPLEMENT, ENSURE COMPLETE, (ENSURE COMPLETE) LIQD    Take 237 mLs by mouth 2 (two) times daily between meals.   HYDRALAZINE (APRESOLINE) 25 MG TABLET    Take 75 mg by mouth 3 (three) times daily.   LISINOPRIL (PRINIVIL,ZESTRIL) 20 MG TABLET    Take 1 tablet (20 mg total) by mouth daily.   LORATADINE (CLARITIN) 10 MG TABLET    Take 10 mg by mouth daily.   MECLIZINE (ANTIVERT) 12.5 MG TABLET    Take 2.5 tablets (31.25 mg total) by mouth 3 (three) times daily as needed for dizziness.   MEMANTINE (NAMENDA) 10 MG TABLET    Take 1 tablet (10 mg total) by mouth 2 (two) times daily.   MIRTAZAPINE (REMERON) 7.5 MG TABLET    Take 1 tablet (7.5 mg total) by mouth at bedtime.   SIMVASTATIN (ZOCOR) 40 MG TABLET    Take 40 mg by mouth at bedtime. Take 1 tablet once daily to lower cholesterol.   ZOSTER VACCINE LIVE     Inject 0.65 mLs into the skin.  Modified Medications   No medications on file  Discontinued Medications   No medications on file   Physical Exam: There were no vitals filed for this visit. Physical Exam  Constitutional: No distress.  Tall frail white male  HENT:  Head: Normocephalic and atraumatic.  Cardiovascular: Normal rate, regular rhythm, normal heart sounds and intact distal pulses.   Pulmonary/Chest: Effort normal and breath sounds normal. No respiratory distress.  Abdominal: Soft. Bowel sounds are  normal. He exhibits no distension. There is no tenderness.  Neurological: He is alert.  Confused, irritable  Skin: Skin is warm and dry.  Left arm with v-shaped skin tear (at site of old IV), no longer draining    Labs reviewed: Basic Metabolic Panel:  Recent Labs  47/82/95 1720 03/05/13 1050  03/08/13 1130 03/09/13 0828 05/14/13 1705 05/15/13 0318 05/15/13 1050  NA 140 142  < >  --  141 140 139  --   K 4.4 4.1  < >  --  3.9 4.5 4.5  --   CL 103 106  < >  --  107 108 107  --   CO2 25 24  < >  --  25 22 22   --   GLUCOSE 169* 121*  < >  --  104* 105* 95  --   BUN 24* 25*  < >  --  20 31* 32*  --   CREATININE 1.87* 1.77*  < >  --  1.75* 1.77* 1.75*  --   CALCIUM 9.6 9.3  < >  --  8.8 9.0 8.5  --   MG  --  1.9  --   --   --   --   --   --   TSH  --  0.957  --  1.707  --   --   --  2.041  < > = values in this interval not displayed. Liver Function Tests:  Recent Labs  03/06/13 0450 05/14/13 1705 05/15/13 0318  AST 19 18 19   ALT 9 12 12   ALKPHOS 57 69 64  BILITOT 0.3 0.2* 0.2*  PROT 5.8* 6.1 5.8*  ALBUMIN 2.8* 3.3* 3.1*    Recent Labs  03/06/13 0450  AMMONIA 18   CBC:  Recent Labs  11/20/12 1618 03/04/13 1720  03/06/13 0450 05/14/13 1705 05/15/13 0318  WBC 5.3 13.4*  < > 6.5 5.7 4.8  NEUTROABS 3.6 12.4*  --   --   --   --   HGB 12.6 14.0  < > 12.6* 11.3* 10.9*  HCT 36.7* 40.1  < > 36.3* 33.4* 32.0*  MCV 91 90.3  < > 90.5 91.5 90.1  PLT  --  160  < > 132* 181 180  < > = values in this interval not displayed. Lipid Panel:  Recent Labs  03/06/13 0450  CHOL 116  HDL 37*  LDLCALC 57  TRIG 147  CHOLHDL 3.1   Lab Results  Component Value Date   HGBA1C 5.9* 03/05/2013   Past Procedures: Reviewed CT chest   Assessment/Plan 1. Dementia without behavioral disturbance -pt is continuing to decline cognitively with 2 recent hospitalizations -he really does not have capacity to make decisions at this stage  2. Pulmonary nodules/lesions,  multiple -will see pulmonary tomorrow to review results and next steps due to 2 nodules that have grown over time -discussed briefly his overall prognosis being poor and the difficulties he may have should they decide to go ahead with biopsies or treatments for possible lung cancer -his sister seems to understand his poor prognosis, but really does not want to have to make such decisions for him--I reviewed that a palliative approach may be best for him given his multiple illnesses and poor prognosis -weight stable since sept  3. Abdominal aneurysm without mention of rupture -5cm--is for repeat CT in 6 mos, but sister says they would not plan on him having surgery anyway if it did enlarge  4. Bradycardia -resolved--was asymptomatic--had been hospitalized for this -remains weak and fatigued, balance poor and refuses to use walker, will only use cane  5. Type II or unspecified type diabetes mellitus with neurological manifestations, not stated as uncontrolled(250.60) -sugar average has been satisfactory; avoid hypoglycemia  6. Adjustment disorder with mixed anxiety and depressed mood -always appears unhappy and miserable, but not particularly cooperative with answering questions  Labs/tests ordered:  Keep appts and I offered to help with further decision making if they desire after they have more information about the lung nodules  Next appt:  Keep as scheduled

## 2013-05-26 NOTE — Telephone Encounter (Signed)
Faxed updated medication list to [facility]

## 2013-05-27 ENCOUNTER — Ambulatory Visit (INDEPENDENT_AMBULATORY_CARE_PROVIDER_SITE_OTHER): Payer: Medicare Other | Admitting: Emergency Medicine

## 2013-05-27 ENCOUNTER — Encounter: Payer: Self-pay | Admitting: Emergency Medicine

## 2013-05-27 VITALS — BP 130/70 | HR 51 | Ht 72.0 in | Wt 173.2 lb

## 2013-05-27 DIAGNOSIS — R918 Other nonspecific abnormal finding of lung field: Secondary | ICD-10-CM | POA: Insufficient documentation

## 2013-05-27 NOTE — Progress Notes (Signed)
Subjective:    Patient ID: [name], male    DOB: 08/16/1927, 79 y.o.   MRN: [id number]  HPI 79 yo man, former smoker, hx of dementia, CAD, AAA, HTN, recent admission for symptomatic bradycardia. Part of his evaluation included a CT scan of the chest performed on 05/16/13 that identified to slowly enlarging left upper lobe groundglass nodules. Also seen was a more solid nodule in the left lower lobe which was slightly enlarged compared with his study on 03/06/13. Bilateral lower lobe infiltrates that were seen on his scan from 9/25 were resolved on the new study. He is referred for evaluation of his pulmonary nodules.     Review of Systems  Constitutional: Negative for fever and unexpected weight change.  HENT: Positive for postnasal drip. Negative for congestion, dental problem, ear pain, nosebleeds, rhinorrhea, sinus pressure, sneezing, sore throat and trouble swallowing.   Eyes: Positive for redness. Negative for itching.  Respiratory: Positive for cough. Negative for chest tightness, shortness of breath and wheezing.   Cardiovascular: Negative for palpitations and leg swelling.  Gastrointestinal: Negative for nausea and vomiting.  Genitourinary: Negative for dysuria.  Musculoskeletal: Negative for joint swelling.  Skin: Negative for rash.  Neurological: Negative for headaches.  Hematological: Bruises/bleeds easily.  Psychiatric/Behavioral: Positive for dysphoric mood. The patient is nervous/anxious.    Past Medical History  Diagnosis Date  . Arthritis   . Depression   . Diabetes mellitus   . Hypertension   . Hyperlipidemia   . Colon cancer   . AAA (abdominal aortic aneurysm) 2011  . CAD (coronary artery disease) 2003  . Anemia   . Diverticula of colon 2013  . Hypertrophy of prostate without urinary obstruction and other lower urinary tract symptoms (LUTS) 2008  . Hyperpotassemia 2008  . Gout, unspecified 2008  . Impacted cerumen 06/20/2006  . First degree  atrioventricular block 2008  . Abnormality of gait 2007  . Other specified cardiac dysrhythmias(427.89) 2005  . Unspecified disorder of kidney and ureter 2005  . Edema 2005  . Malignant neoplasm of colon, unspecified site 2004  . Unspecified hereditary and idiopathic peripheral neuropathy 2004  . Chest pain, unspecified 2003  . Pain in joint, pelvic region and thigh 2004  . Pain in limb 2003  . Myocardial infarction 1984  . Exertional shortness of breath   . Pneumonia ~ 02/2013    "hospitalized for double pneumonia" (05/15/2013)  . Anxiety   . Chronic kidney disease     "born w/only 1 kidney" (05/15/2013)  . Chronic kidney disease (CKD), stage III (moderate)     [name] 05/14/2013 (05/15/2013)  . Postural dizziness     hospitalized 05/14/2013     Family History  Problem Relation Age of Onset  . Heart disease Father   . Pancreatic cancer Sister   . Cancer Sister     liver  . Cancer Brother     Leukemia  . Cancer Mother     ?     History   Social History  . Marital Status: Single    Spouse Name: N/A    Number of Children: 0  . Years of Education: N/A   Occupational History  .     Social History Main Topics  . Smoking status: Former Smoker -- 1.00 packs/day for 35 years    Types: Cigarettes    Quit date: 06/12/1982  . Smokeless tobacco: Former Neurosurgeon    Types: Snuff  . Alcohol Use: Yes     Comment:  05/15/2013 "have a beer sometimes; not very often"  . Drug Use: No  . Sexual Activity: No   Other Topics Concern  . Not on file   Social History Narrative  . No narrative on file     Allergies  Allergen Reactions  . Diovan [Valsartan] Other (See Comments)    Unknown      Outpatient Prescriptions Prior to Visit  Medication Sig Dispense Refill  . ALPRAZolam (XANAX) 0.25 MG tablet Take 1 tablet (0.25 mg total) by mouth at bedtime as needed for anxiety.  60 tablet  5  . aspirin 81 MG tablet Take 81 mg by mouth daily. Take 1 tablet daily to prevent stroke and heart  attack.      . donepezil (ARICEPT) 10 MG tablet Take 10 mg by mouth at bedtime.      . feeding supplement, ENSURE COMPLETE, (ENSURE COMPLETE) LIQD Take 237 mLs by mouth 2 (two) times daily between meals.  30 Bottle  1  . hydrALAZINE (APRESOLINE) 25 MG tablet Take 75 mg by mouth 3 (three) times daily.      Marland Kitchen lisinopril (PRINIVIL,ZESTRIL) 20 MG tablet Take 1 tablet (20 mg total) by mouth daily.  30 tablet  6  . loratadine (CLARITIN) 10 MG tablet Take 10 mg by mouth daily.      . meclizine (ANTIVERT) 12.5 MG tablet Take 2.5 tablets (31.25 mg total) by mouth 3 (three) times daily as needed for dizziness.  30 tablet  0  . memantine (NAMENDA) 10 MG tablet Take 1 tablet (10 mg total) by mouth 2 (two) times daily.      . mirtazapine (REMERON) 7.5 MG tablet Take 1 tablet (7.5 mg total) by mouth at bedtime.  30 tablet  3  . simvastatin (ZOCOR) 40 MG tablet Take 40 mg by mouth at bedtime. Take 1 tablet once daily to lower cholesterol.      Marland Kitchen ZOSTER VACCINE LIVE [place] Inject 0.65 mLs into the skin.       No facility-administered medications prior to visit.         Objective:   Physical Exam Filed Vitals:   05/27/13 1506  BP: 130/70  Pulse: 51  Height: 6' (1.829 m)  Weight: 173 lb 3.2 oz (78.563 kg)  SpO2: 99%   Gen: Quiet, somewhat frail man, in no distress,  normal affect  ENT: No lesions,  mouth clear,  oropharynx clear, no postnasal drip  Neck: No JVD, no TMG, no carotid bruits  Lungs: No use of accessory muscles, clear without rales or rhonchi  Cardiovascular: RRR, heart sounds normal, no murmur or gallops, no peripheral edema  Musculoskeletal: No deformities, no cyanosis or clubbing  Neuro: alert, non focal  Skin: Warm, no lesions or rashes     Assessment & Plan:  Pulmonary nodules 2 stable groundglass nodules and one slightly enlarging or solid nodule noted on CT scan from 05/16/13. Discussed pros and cons of biopsy with him and his sister today. Given his overall prognosis, has  multiple medical problems, and his ability to withstand either chemotherapy or radiation therapy I have recommended that we defer nodule biopsy at this time and instead repeat his CT scan of the chest in 6 months to look for interval change. Pending on his clinical status and his CT results at that time we may decide to reconsider biopsy.

## 2013-05-27 NOTE — Patient Instructions (Signed)
We will repeat your CT scan of the chest in 6 months Follow in 6 months after the scan to review the results

## 2013-05-27 NOTE — Assessment & Plan Note (Signed)
2 stable groundglass nodules and one slightly enlarging or solid nodule noted on CT scan from 05/16/13. Discussed pros and cons of biopsy with him and his sister today. Given his overall prognosis, has multiple medical problems, and his ability to withstand either chemotherapy or radiation therapy I have recommended that we defer nodule biopsy at this time and instead repeat his CT scan of the chest in 6 months to look for interval change. Pending on his clinical status and his CT results at that time we may decide to reconsider biopsy.

## 2013-06-09 ENCOUNTER — Ambulatory Visit (INDEPENDENT_AMBULATORY_CARE_PROVIDER_SITE_OTHER): Payer: Medicare Other | Admitting: Internal Medicine

## 2013-06-09 ENCOUNTER — Telehealth: Payer: Self-pay | Admitting: *Deleted

## 2013-06-09 ENCOUNTER — Other Ambulatory Visit: Payer: Self-pay | Admitting: Internal Medicine

## 2013-06-09 ENCOUNTER — Encounter: Payer: Self-pay | Admitting: Internal Medicine

## 2013-06-09 ENCOUNTER — Other Ambulatory Visit: Payer: Self-pay | Admitting: Nurse Practitioner

## 2013-06-09 VITALS — BP 148/76 | HR 60 | Temp 97.4°F | Wt 178.0 lb

## 2013-06-09 DIAGNOSIS — F03918 Unspecified dementia, unspecified severity, with other behavioral disturbance: Secondary | ICD-10-CM

## 2013-06-09 DIAGNOSIS — B3789 Other sites of candidiasis: Secondary | ICD-10-CM

## 2013-06-09 DIAGNOSIS — F4323 Adjustment disorder with mixed anxiety and depressed mood: Secondary | ICD-10-CM

## 2013-06-09 DIAGNOSIS — F0391 Unspecified dementia with behavioral disturbance: Secondary | ICD-10-CM

## 2013-06-09 DIAGNOSIS — E785 Hyperlipidemia, unspecified: Secondary | ICD-10-CM

## 2013-06-09 DIAGNOSIS — I10 Essential (primary) hypertension: Secondary | ICD-10-CM

## 2013-06-09 DIAGNOSIS — G4700 Insomnia, unspecified: Secondary | ICD-10-CM

## 2013-06-09 MED ORDER — CITALOPRAM HYDROBROMIDE 20 MG PO TABS: 20.0000 mg | ORAL_TABLET | Freq: Every day | ORAL | Status: DC

## 2013-06-09 MED ORDER — LISINOPRIL 20 MG PO TABS: 20.0000 mg | ORAL_TABLET | Freq: Every day | ORAL | Status: DC

## 2013-06-09 MED ORDER — SIMVASTATIN 40 MG PO TABS
40.0000 mg | ORAL_TABLET | Freq: Every day | ORAL | Status: DC
Start: 2013-06-09 — End: 2013-11-16

## 2013-06-09 MED ORDER — DIVALPROEX SODIUM 125 MG PO CPSP: 125.0000 mg | ORAL_CAPSULE | Freq: Two times a day (BID) | ORAL | Status: DC

## 2013-06-09 MED ORDER — NYSTATIN 100000 UNIT/GM EX CREA: 1.0000 "application " | TOPICAL_CREAM | Freq: Two times a day (BID) | CUTANEOUS | Status: DC

## 2013-06-09 MED ORDER — ENSURE COMPLETE PO LIQD: 237.0000 mL | Freq: Two times a day (BID) | ORAL | Status: AC

## 2013-06-09 MED ORDER — MEMANTINE HCL 10 MG PO TABS: 10.0000 mg | ORAL_TABLET | Freq: Two times a day (BID) | ORAL | Status: DC

## 2013-06-09 MED ORDER — DONEPEZIL HCL 10 MG PO TABS: 10.0000 mg | ORAL_TABLET | Freq: Every day | ORAL | Status: DC

## 2013-06-09 MED ORDER — MIRTAZAPINE 7.5 MG PO TABS: 7.5000 mg | ORAL_TABLET | Freq: Every day | ORAL | Status: DC
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
## 2013-06-10 ENCOUNTER — Other Ambulatory Visit: Payer: Self-pay | Admitting: Internal Medicine

## 2013-06-10 ENCOUNTER — Other Ambulatory Visit: Payer: Self-pay | Admitting: Nurse Practitioner

## 2013-06-10 NOTE — Telephone Encounter (Signed)
Please advise on refill request, patient was seen yesterday and caregiver was advised to taper patient off xanax. Please provider tapering instructions and dispense number

## 2013-06-10 NOTE — Telephone Encounter (Signed)
I told them to use xanax only until depakote sprinkles began working.  I will give them 14 tablets, one every 12 hours as needed for anxiety.

## 2013-06-16 ENCOUNTER — Encounter: Payer: Self-pay | Admitting: Internal Medicine

## 2013-06-16 ENCOUNTER — Telehealth: Payer: Self-pay | Admitting: *Deleted

## 2013-06-16 NOTE — Telephone Encounter (Signed)
Letter also should be sent to [facility] itself, please.
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
## 2013-06-16 NOTE — Telephone Encounter (Signed)
[name], Caregiver

## 2013-06-16 NOTE — Telephone Encounter (Signed)
Patient caregiver, Letta Median, notified to pick up letter

## 2013-06-17 NOTE — Telephone Encounter (Signed)
Caregiver Notified and will pick up letter.

## 2013-06-17 NOTE — Telephone Encounter (Signed)
Printed letter and mailed to [facility]: [address] [place] [id number]

## 2013-07-18 ENCOUNTER — Other Ambulatory Visit: Payer: Self-pay | Admitting: Internal Medicine

## 2013-07-21 ENCOUNTER — Encounter: Payer: Self-pay | Admitting: Internal Medicine

## 2013-07-21 ENCOUNTER — Other Ambulatory Visit: Payer: Medicare Other

## 2013-07-21 ENCOUNTER — Ambulatory Visit (INDEPENDENT_AMBULATORY_CARE_PROVIDER_SITE_OTHER): Payer: Medicare Other | Admitting: Internal Medicine

## 2013-07-21 ENCOUNTER — Ambulatory Visit: Payer: Medicare Other | Admitting: Surgery

## 2013-07-21 VITALS — BP 162/70 | HR 74 | Temp 98.0°F | Wt 182.0 lb

## 2013-07-21 DIAGNOSIS — R918 Other nonspecific abnormal finding of lung field: Secondary | ICD-10-CM

## 2013-07-21 DIAGNOSIS — F0391 Unspecified dementia with behavioral disturbance: Secondary | ICD-10-CM

## 2013-07-21 DIAGNOSIS — F4323 Adjustment disorder with mixed anxiety and depressed mood: Secondary | ICD-10-CM

## 2013-07-21 DIAGNOSIS — E1149 Type 2 diabetes mellitus with other diabetic neurological complication: Secondary | ICD-10-CM

## 2013-07-21 DIAGNOSIS — F03918 Unspecified dementia, unspecified severity, with other behavioral disturbance: Secondary | ICD-10-CM

## 2013-07-21 DIAGNOSIS — I10 Essential (primary) hypertension: Secondary | ICD-10-CM

## 2013-07-21 DIAGNOSIS — G4700 Insomnia, unspecified: Secondary | ICD-10-CM

## 2013-07-21 NOTE — Progress Notes (Signed)
Patient ID: [name], male   DOB: 03/08/1928, 80 y.o.   MRN: [id number]   Location:  [facility]  Code Status: DNR   Allergies  Allergen Reactions  . Diovan [Valsartan] Other (See Comments)    Unknown     Chief Complaint  Patient presents with  . Medical Managment of Chronic Issues    6 week f/u     HPI: Patient is a 80 y.o. white male with dementia with behaviors seen in the office today for medical mgt of chronic diseases.    No pain today.  Says mood is good.  Some of the meds need to be renewed.    2 nodules on lung--will have f/u CT in 6 mos from end of December.    May have skin cancer removed on left cheek due to enlargement.    Mood has been better with celexa and depakote.  Still using 1 twice a day of the xanax.  Still doesn't sleep a proper cycle.    Review of Systems:  ROS   Past Medical History  Diagnosis Date  . Arthritis   . Depression   . Diabetes mellitus   . Hypertension   . Hyperlipidemia   . Colon cancer   . AAA (abdominal aortic aneurysm) 2011  . CAD (coronary artery disease) 2003  . Anemia   . Diverticula of colon 2013  . Hypertrophy of prostate without urinary obstruction and other lower urinary tract symptoms (LUTS) 2008  . Hyperpotassemia 2008  . Gout, unspecified 2008  . Impacted cerumen 06/20/2006  . First degree atrioventricular block 2008  . Abnormality of gait 2007  . Other specified cardiac dysrhythmias(427.89) 2005  . Unspecified disorder of kidney and ureter 2005  . Edema 2005  . Malignant neoplasm of colon, unspecified site 2004  . Unspecified hereditary and idiopathic peripheral neuropathy 2004  . Chest pain, unspecified 2003  . Pain in joint, pelvic region and thigh 2004  . Pain in limb 2003  . Myocardial infarction 1984  . Exertional shortness of breath   . Pneumonia ~ 02/2013    "hospitalized for double pneumonia" (05/15/2013)  . Anxiety   . Chronic kidney disease    "born w/only 1 kidney" (05/15/2013)  . Chronic kidney disease (CKD), stage III (moderate)     [name] 05/14/2013 (05/15/2013)  . Postural dizziness     hospitalized 05/14/2013    Past Surgical History  Procedure Laterality Date  . Mediastinotomy    . Right colectomy  2004  . Inguinal hernia repair Right 1952    In [place]  . Coronary artery bypass graft  1996    Bartle, MD  . Cardiac catheterization    . Colon surgery      Social History:   reports that he quit smoking about 31 years ago. His smoking use included Cigarettes. He has a 35 pack-year smoking history. He has quit using smokeless tobacco. His smokeless tobacco use included Snuff. He reports that he drinks alcohol. He reports that he does not use illicit drugs.  Family History  Problem Relation Age of Onset  . Heart disease Father   . Pancreatic cancer Sister   . Cancer Sister     liver  . Cancer Brother     Leukemia  . Cancer Mother     ?    Medications: Patient's Medications  New Prescriptions   No medications on file  Previous Medications   ALPRAZOLAM (XANAX) 0.25 MG  TABLET    Take 1 tablet (0.25 mg total) by mouth 2 (two) times daily as needed for anxiety (or severe agitation with combative behavior).   ASPIRIN 81 MG EC TABLET    TAKE 1 TABLET EVERY DAY   ASPIRIN 81 MG TABLET    Take 81 mg by mouth daily. Take 1 tablet daily to prevent stroke and heart attack.   CITALOPRAM (CELEXA) 20 MG TABLET    Take 1 tablet (20 mg total) by mouth daily.   DIVALPROEX (DEPAKOTE SPRINKLES) 125 MG CAPSULE    Take 1 capsule (125 mg total) by mouth 2 (two) times daily.   DONEPEZIL (ARICEPT) 10 MG TABLET    Take 1 tablet (10 mg total) by mouth at bedtime.   FEEDING SUPPLEMENT, ENSURE COMPLETE, (ENSURE COMPLETE) LIQD    Take 237 mLs by mouth 2 (two) times daily between meals.   HYDRALAZINE (APRESOLINE) 25 MG TABLET    TAKE 3 TABLETS 3 TIMES A DAY   LISINOPRIL (PRINIVIL,ZESTRIL) 20 MG TABLET    Take 1 tablet (20 mg total) by mouth  daily.   LORATADINE (CLARITIN) 10 MG TABLET    Take 10 mg by mouth daily.   MECLIZINE (ANTIVERT) 12.5 MG TABLET    Take 2.5 tablets (31.25 mg total) by mouth 3 (three) times daily as needed for dizziness.   NAMENDA 10 MG TABLET    TAKE 1 TABLET TWICE A DAY   NYSTATIN CREAM (MYCOSTATIN)    Apply 1 application topically 2 (two) times daily.   SIMVASTATIN (ZOCOR) 40 MG TABLET    Take 1 tablet (40 mg total) by mouth at bedtime. Take 1 tablet once daily to lower cholesterol.   ZOSTER VACCINE LIVE [place]    Inject 0.65 mLs into the skin.  Modified Medications   No medications on file  Discontinued Medications   DONEPEZIL (ARICEPT) 5 MG TABLET    TAKE 1 TABLET (5 MG TOTAL) BY MOUTH AT BEDTIME.     Physical Exam: Filed Vitals:   07/21/13 1357  BP: 162/70  Pulse: 74  Temp: 98 F (36.7 C)  TempSrc: Oral  Weight: 182 lb (82.555 kg)  SpO2: 97%  Physical Exam   Labs reviewed: Basic Metabolic Panel:  Recent Labs  03/04/13 1720 03/05/13 1050  03/08/13 1130 03/09/13 0828 05/14/13 1705 05/15/13 0318 05/15/13 1050  NA 140 142  < >  --  141 140 139  --   K 4.4 4.1  < >  --  3.9 4.5 4.5  --   CL 103 106  < >  --  107 108 107  --   CO2 25 24  < >  --  25 22 22   --   GLUCOSE 169* 121*  < >  --  104* 105* 95  --   BUN 24* 25*  < >  --  20 31* 32*  --   CREATININE 1.87* 1.77*  < >  --  1.75* 1.77* 1.75*  --   CALCIUM 9.6 9.3  < >  --  8.8 9.0 8.5  --   MG  --  1.9  --   --   --   --   --   --   TSH  --  0.957  --  1.707  --   --   --  2.041  < > = values in this interval not displayed. Liver Function Tests:  Recent Labs  03/06/13 0450 05/14/13 1705 05/15/13 0318  AST 19 18  19  ALT 9 12 12   ALKPHOS 57 69 64  BILITOT 0.3 0.2* 0.2*  PROT 5.8* 6.1 5.8*  ALBUMIN 2.8* 3.3* 3.1*    Recent Labs  03/06/13 0450  AMMONIA 18   CBC:  Recent Labs  11/20/12 1618 03/04/13 1720  03/06/13 0450 05/14/13 1705 05/15/13 0318  WBC 5.3 13.4*  < > 6.5 5.7 4.8  NEUTROABS 3.6 12.4*  --   --    --   --   HGB 12.6 14.0  < > 12.6* 11.3* 10.9*  HCT 36.7* 40.1  < > 36.3* 33.4* 32.0*  MCV 91 90.3  < > 90.5 91.5 90.1  PLT  --  160  < > 132* 181 180  < > = values in this interval not displayed. Lipid Panel:  Recent Labs  03/06/13 0450  CHOL 116  HDL 37*  LDLCALC 57  TRIG 109  CHOLHDL 3.1   Lab Results  Component Value Date   HGBA1C 5.9* 03/05/2013   Assessment/Plan 1. Dementia with behavioral disturbance -improved with current regimen of namenda, aricept, depakote, celexa  2. Adjustment disorder with mixed anxiety and depressed mood -mood improved since last time with use of celexa and depakote sprinkles bid, still requiring bid xanax to prevent combativeness with his aide--is easily ticked off, but less often than previously  3. Type II or unspecified type diabetes mellitus with neurological manifestations, not stated as uncontrolled -f/u labs before next appt  4. Insomnia, unspecified -still not sleeping on a regular schedule (sleeps a lot in the daytime), but mood is stable these days so we are not going to change   5. Essential hypertension, benign -bp initially elevated today  6. Pulmonary nodules/lesions, multiple -keep f/u CT scan to reevaluate   Labs/tests ordered: Orders Placed This Encounter  Procedures  . CBC with Differential    Standing Status: Future     Number of Occurrences:      Standing Expiration Date: 01/18/2014  . Basic metabolic panel    Standing Status: Future     Number of Occurrences:      Standing Expiration Date: 01/18/2014  . Lipid panel    Standing Status: Future     Number of Occurrences:      Standing Expiration Date: 01/18/2014  . Hemoglobin A1c    Standing Status: Future     Number of Occurrences:      Standing Expiration Date: 01/18/2014   Next appt:  3 mos

## 2013-07-28 ENCOUNTER — Other Ambulatory Visit: Payer: Self-pay | Admitting: *Deleted

## 2013-07-28 DIAGNOSIS — I10 Essential (primary) hypertension: Secondary | ICD-10-CM

## 2013-07-28 MED ORDER — LISINOPRIL 20 MG PO TABS: 20.0000 mg | ORAL_TABLET | Freq: Every day | ORAL | Status: DC

## 2013-07-28 NOTE — Addendum Note (Signed)
Addended by: Logan Bores on: 07/28/2013 10:45 AM   Modules accepted: Orders

## 2013-08-02 ENCOUNTER — Other Ambulatory Visit: Payer: Self-pay | Admitting: Internal Medicine

## 2013-08-03 ENCOUNTER — Other Ambulatory Visit: Payer: Self-pay | Admitting: Nurse Practitioner

## 2013-08-07 NOTE — Progress Notes (Signed)
Pt has a caregiver lives with him-faye horton-[phone]-she will bring him-and take him home-some dementia, but alert-can sign a permit

## 2013-08-11 NOTE — H&P (Signed)
PREOPERATIVE H&P  Chief Complaint: facial skin lesion  HPI: [name] is a 80 y.o. male who presents for evaluation of hearing loss. On exam in office he was noted to have a small chronic sore on his face consistent with probable BCCa. He's taken to the OR for excision under local anesthetic.  Past Medical History  Diagnosis Date  . Arthritis   . Depression   . Diabetes mellitus   . Hypertension   . Hyperlipidemia   . Colon cancer   . AAA (abdominal aortic aneurysm) 2011  . CAD (coronary artery disease) 2003  . Anemia   . Diverticula of colon 2013  . Hypertrophy of prostate without urinary obstruction and other lower urinary tract symptoms (LUTS) 2008  . Hyperpotassemia 2008  . Gout, unspecified 2008  . Impacted cerumen 06/20/2006  . First degree atrioventricular block 2008  . Abnormality of gait 2007  . Other specified cardiac dysrhythmias(427.89) 2005  . Unspecified disorder of kidney and ureter 2005  . Edema 2005  . Malignant neoplasm of colon, unspecified site 2004  . Unspecified hereditary and idiopathic peripheral neuropathy 2004  . Chest pain, unspecified 2003  . Pain in joint, pelvic region and thigh 2004  . Pain in limb 2003  . Myocardial infarction 1984  . Exertional shortness of breath   . Pneumonia ~ 02/2013    "hospitalized for double pneumonia" (05/15/2013)  . Anxiety   . Chronic kidney disease     "born w/only 1 kidney" (05/15/2013)  . Chronic kidney disease (CKD), stage III (moderate)     [name] 05/14/2013 (05/15/2013)  . Postural dizziness     hospitalized 05/14/2013   Past Surgical History  Procedure Laterality Date  . Mediastinotomy    . Right colectomy  2004  . Inguinal hernia repair Right 1952    In [place]  . Coronary artery bypass graft  1996    Bartle, MD  . Cardiac catheterization    . Colon surgery     History   Social History  . Marital Status: Single    Spouse Name: N/A    Number of Children: 0  . Years of Education: N/A    Occupational History  .     Social History Main Topics  . Smoking status: Former Smoker -- 1.00 packs/day for 35 years    Types: Cigarettes    Quit date: 06/12/1982  . Smokeless tobacco: Former Systems developer    Types: Snuff  . Alcohol Use: Yes     Comment: 05/15/2013 "have a beer sometimes; not very often"  . Drug Use: No  . Sexual Activity: No   Other Topics Concern  . Not on file   Social History Narrative  . No narrative on file   Family History  Problem Relation Age of Onset  . Heart disease Father   . Pancreatic cancer Sister   . Cancer Sister     liver  . Cancer Brother     Leukemia  . Cancer Mother     ?   Allergies  Allergen Reactions  . Diovan [Valsartan] Other (See Comments)    Unknown    Prior to Admission medications   Medication Sig Start Date End Date Taking? Authorizing Provider  ALPRAZolam (XANAX) 0.25 MG tablet Take 1 tablet (0.25 mg total) by mouth 2 (two) times daily as needed for anxiety (or severe agitation with combative behavior). 06/09/13   Tiffany L Reed, DO  aspirin 81 MG EC tablet TAKE 1 TABLET EVERY DAY  06/10/13   Tiffany L Reed, DO  aspirin 81 MG tablet Take 81 mg by mouth daily. Take 1 tablet daily to prevent stroke and heart attack.    Historical Provider, MD  citalopram (CELEXA) 20 MG tablet Take 1 tablet (20 mg total) by mouth daily. 06/09/13   Tiffany L Reed, DO  divalproex (DEPAKOTE SPRINKLES) 125 MG capsule Take 1 capsule (125 mg total) by mouth 2 (two) times daily. 06/09/13   Tiffany L Reed, DO  donepezil (ARICEPT) 10 MG tablet Take 1 tablet (10 mg total) by mouth at bedtime. 06/09/13   Tiffany L Reed, DO  feeding supplement, ENSURE COMPLETE, (ENSURE COMPLETE) LIQD Take 237 mLs by mouth 2 (two) times daily between meals. 06/09/13   Tiffany L Reed, DO  hydrALAZINE (APRESOLINE) 25 MG tablet TAKE 3 TABLETS 3 TIMES A DAY 07/18/13   Pricilla Larsson, NP  lisinopril (PRINIVIL,ZESTRIL) 20 MG tablet Take 1 tablet (20 mg total) by mouth daily. 07/28/13    Tiffany L Reed, DO  loratadine (CLARITIN) 10 MG tablet Take 10 mg by mouth daily.    Historical Provider, MD  meclizine (ANTIVERT) 12.5 MG tablet Take 2.5 tablets (31.25 mg total) by mouth 3 (three) times daily as needed for dizziness. 05/17/13   Thurnell Lose, MD  mirtazapine (REMERON) 15 MG tablet TAKE 1/2 TABLET BY MOUTH EVERY NIGHT    Estill Dooms, MD  NAMENDA 10 MG tablet TAKE 1 TABLET TWICE A DAY 06/10/13   Tiffany L Reed, DO  nystatin cream (MYCOSTATIN) Apply 1 application topically 2 (two) times daily. 06/09/13   Tiffany L Reed, DO  simvastatin (ZOCOR) 40 MG tablet Take 1 tablet (40 mg total) by mouth at bedtime. Take 1 tablet once daily to lower cholesterol. 06/09/13   Tiffany L Reed, DO  ZOSTER VACCINE LIVE  Inject 0.65 mLs into the skin.    Historical Provider, MD     Positive ROS: negative  All other systems have been reviewed and were otherwise negative with the exception of those mentioned in the HPI and as above.  Physical Exam: There were no vitals filed for this visit.  General: Alert, no acute distress Oral: Normal oral mucosa and tonsils Nasal: Clear nasal passages Neck: No palpable adenopathy or thyroid nodules. 1 cm facial skin lesion Ear: Ear canal is clear with normal appearing TMs Cardiovascular: Regular rate and rhythm, no murmur.  Respiratory: Clear to auscultation Neurologic: Alert and oriented x 3   Assessment/Plan: FACIAL SKIN CANCERS  Plan for Procedure(s): EXCISION FACIAL SKIN CANCER X 2    (MINOR PROCEDURE)    Melony Overly, MD 08/11/2013 3:50 PM

## 2013-08-12 ENCOUNTER — Encounter (HOSPITAL_BASED_OUTPATIENT_CLINIC_OR_DEPARTMENT_OTHER): Payer: Self-pay | Admitting: *Deleted

## 2013-08-12 ENCOUNTER — Ambulatory Visit (HOSPITAL_BASED_OUTPATIENT_CLINIC_OR_DEPARTMENT_OTHER)
Admission: RE | Admit: 2013-08-12 | Discharge: 2013-08-12 | Disposition: A | Discharge status: Home / Self Care | Payer: Medicare Other | Source: Ambulatory Visit | Attending: Otolaryngology | Admitting: Otolaryngology

## 2013-08-12 ENCOUNTER — Encounter (HOSPITAL_BASED_OUTPATIENT_CLINIC_OR_DEPARTMENT_OTHER)
Admission: RE | Disposition: A | Discharge status: Home / Self Care | Payer: Self-pay | Source: Ambulatory Visit | Attending: Otolaryngology

## 2013-08-12 DIAGNOSIS — N183 Chronic kidney disease, stage 3 unspecified: Secondary | ICD-10-CM | POA: Insufficient documentation

## 2013-08-12 DIAGNOSIS — E875 Hyperkalemia: Secondary | ICD-10-CM | POA: Insufficient documentation

## 2013-08-12 DIAGNOSIS — I129 Hypertensive chronic kidney disease with stage 1 through stage 4 chronic kidney disease, or unspecified chronic kidney disease: Secondary | ICD-10-CM | POA: Insufficient documentation

## 2013-08-12 DIAGNOSIS — Z87891 Personal history of nicotine dependence: Secondary | ICD-10-CM | POA: Insufficient documentation

## 2013-08-12 DIAGNOSIS — C44111 Basal cell carcinoma of skin of unspecified eyelid, including canthus: Secondary | ICD-10-CM | POA: Insufficient documentation

## 2013-08-12 DIAGNOSIS — Z951 Presence of aortocoronary bypass graft: Secondary | ICD-10-CM | POA: Insufficient documentation

## 2013-08-12 DIAGNOSIS — E119 Type 2 diabetes mellitus without complications: Secondary | ICD-10-CM | POA: Insufficient documentation

## 2013-08-12 DIAGNOSIS — I440 Atrioventricular block, first degree: Secondary | ICD-10-CM | POA: Insufficient documentation

## 2013-08-12 DIAGNOSIS — M109 Gout, unspecified: Secondary | ICD-10-CM | POA: Insufficient documentation

## 2013-08-12 DIAGNOSIS — I252 Old myocardial infarction: Secondary | ICD-10-CM | POA: Insufficient documentation

## 2013-08-12 DIAGNOSIS — Z7982 Long term (current) use of aspirin: Secondary | ICD-10-CM | POA: Insufficient documentation

## 2013-08-12 DIAGNOSIS — F411 Generalized anxiety disorder: Secondary | ICD-10-CM | POA: Insufficient documentation

## 2013-08-12 DIAGNOSIS — F3289 Other specified depressive episodes: Secondary | ICD-10-CM | POA: Insufficient documentation

## 2013-08-12 DIAGNOSIS — I2510 Atherosclerotic heart disease of native coronary artery without angina pectoris: Secondary | ICD-10-CM | POA: Insufficient documentation

## 2013-08-12 DIAGNOSIS — N400 Enlarged prostate without lower urinary tract symptoms: Secondary | ICD-10-CM | POA: Insufficient documentation

## 2013-08-12 DIAGNOSIS — E785 Hyperlipidemia, unspecified: Secondary | ICD-10-CM | POA: Insufficient documentation

## 2013-08-12 DIAGNOSIS — F329 Major depressive disorder, single episode, unspecified: Secondary | ICD-10-CM | POA: Insufficient documentation

## 2013-08-12 DIAGNOSIS — C44319 Basal cell carcinoma of skin of other parts of face: Secondary | ICD-10-CM | POA: Insufficient documentation

## 2013-08-12 DIAGNOSIS — D649 Anemia, unspecified: Secondary | ICD-10-CM | POA: Insufficient documentation

## 2013-08-12 HISTORY — PX: LESION REMOVAL: SHX5196

## 2013-08-12 SURGERY — MINOR EXCISION OF LESION
Anesthesia: LOCAL | Site: Face

## 2013-08-12 MED ORDER — BACITRACIN 500 UNIT/GM EX OINT
TOPICAL_OINTMENT | CUTANEOUS | Status: DC | PRN
  Administered 2013-08-12: 1 via TOPICAL

## 2013-08-12 MED ORDER — SILVER NITRATE-POT NITRATE 75-25 % EX MISC
CUTANEOUS | Status: AC
  Filled 2013-08-12: qty 1

## 2013-08-12 MED ORDER — LIDOCAINE-EPINEPHRINE 1 %-1:100000 IJ SOLN
INTRAMUSCULAR | Status: AC
  Filled 2013-08-12: qty 1

## 2013-08-12 MED ORDER — BACITRACIN ZINC 500 UNIT/GM EX OINT
TOPICAL_OINTMENT | CUTANEOUS | Status: AC
  Filled 2013-08-12: qty 2.7

## 2013-08-12 MED ORDER — LIDOCAINE-EPINEPHRINE 1 %-1:100000 IJ SOLN
INTRAMUSCULAR | Status: DC | PRN
  Administered 2013-08-12: 9 mL

## 2013-08-12 SURGICAL SUPPLY — 59 items
ADH SKN CLS APL DERMABOND .7 (GAUZE/BANDAGES/DRESSINGS)
APL SKNCLS STERI-STRIP NONHPOA (GAUZE/BANDAGES/DRESSINGS)
BALL CTTN LRG ABS STRL LF (GAUZE/BANDAGES/DRESSINGS)
BANDAGE ADH SHEER 1  50/CT (GAUZE/BANDAGES/DRESSINGS) IMPLANT
BENZOIN TINCTURE PRP APPL 2/3 (GAUZE/BANDAGES/DRESSINGS) IMPLANT
BLADE SURG 15 STRL LF DISP TIS (BLADE) ×1 IMPLANT
BLADE SURG 15 STRL SS (BLADE) ×2
BNDG ADH 7/8 SPOT LF (GAUZE/BANDAGES/DRESSINGS) ×2 IMPLANT
CANISTER SUCT 1200ML W/VALVE (MISCELLANEOUS) IMPLANT
CAUTERY EYE LOW TEMP 1300F FIN (OPHTHALMIC RELATED) IMPLANT
CLEANER CAUTERY TIP 5X5 PAD (MISCELLANEOUS) IMPLANT
COTTONBALL LRG STERILE PKG (GAUZE/BANDAGES/DRESSINGS) IMPLANT
DECANTER SPIKE VIAL GLASS SM (MISCELLANEOUS) IMPLANT
DERMABOND ADVANCED (GAUZE/BANDAGES/DRESSINGS)
DERMABOND ADVANCED .7 DNX12 (GAUZE/BANDAGES/DRESSINGS) IMPLANT
DRSG TEGADERM 4X4.75 (GAUZE/BANDAGES/DRESSINGS) IMPLANT
ELECT COATED BLADE 2.86 ST (ELECTRODE) ×2 IMPLANT
ELECT REM PT RETURN 9FT ADLT (ELECTROSURGICAL) ×2
ELECTRODE REM PT RTRN 9FT ADLT (ELECTROSURGICAL) ×1 IMPLANT
GAUZE SPONGE 4X4 16PLY XRAY LF (GAUZE/BANDAGES/DRESSINGS) IMPLANT
GLOVE BIOGEL M 7.0 STRL (GLOVE) ×1 IMPLANT
GLOVE BIOGEL PI IND STRL 7.5 (GLOVE) IMPLANT
GLOVE BIOGEL PI INDICATOR 7.5 (GLOVE) ×1
GLOVE EXAM NITRILE MD LF STRL (GLOVE) ×1 IMPLANT
GLOVE SS BIOGEL STRL SZ 7.5 (GLOVE) ×1 IMPLANT
GLOVE SUPERSENSE BIOGEL SZ 7.5 (GLOVE) ×2
GOWN STRL REUS W/ TWL LRG LVL3 (GOWN DISPOSABLE) IMPLANT
GOWN STRL REUS W/TWL LRG LVL3 (GOWN DISPOSABLE) ×4
MARKER SKIN DUAL TIP RULER LAB (MISCELLANEOUS) IMPLANT
NEEDLE 27GAX1X1/2 (NEEDLE) ×2 IMPLANT
NS IRRIG 1000ML POUR BTL (IV SOLUTION) IMPLANT
PACK BASIN DAY SURGERY FS (CUSTOM PROCEDURE TRAY) ×1 IMPLANT
PAD CLEANER CAUTERY TIP 5X5 (MISCELLANEOUS)
PENCIL BUTTON HOLSTER BLD 10FT (ELECTRODE) ×2 IMPLANT
SPONGE GAUZE 4X4 12PLY STER LF (GAUZE/BANDAGES/DRESSINGS) IMPLANT
SPONGE INTESTINAL PEANUT (DISPOSABLE) IMPLANT
STRIP CLOSURE SKIN 1/2X4 (GAUZE/BANDAGES/DRESSINGS) IMPLANT
STRIP CLOSURE SKIN 1/4X4 (GAUZE/BANDAGES/DRESSINGS) IMPLANT
SUCTION FRAZIER TIP 10 FR DISP (SUCTIONS) IMPLANT
SUT CHROMIC 3 0 PS 2 (SUTURE) IMPLANT
SUT CHROMIC 4 0 P 3 18 (SUTURE) IMPLANT
SUT ETHILON 5 0 P 3 18 (SUTURE)
SUT ETHILON 6 0 P 1 (SUTURE) ×1 IMPLANT
SUT NYLON ETHILON 5-0 P-3 1X18 (SUTURE) IMPLANT
SUT PLAIN 5 0 P 3 18 (SUTURE) IMPLANT
SUT SILK 4 0 TIES 17X18 (SUTURE) IMPLANT
SUT VIC AB 4-0 P-3 18XBRD (SUTURE) IMPLANT
SUT VIC AB 4-0 P3 18 (SUTURE) ×2
SUT VIC AB 5-0 P-3 18X BRD (SUTURE) IMPLANT
SUT VIC AB 5-0 P3 18 (SUTURE) ×2
SWAB COLLECTION DEVICE MRSA (MISCELLANEOUS) IMPLANT
SWABSTICK POVIDONE IODINE SNGL (MISCELLANEOUS) ×4 IMPLANT
SYR BULB 3OZ (MISCELLANEOUS) IMPLANT
SYR CONTROL 10ML LL (SYRINGE) ×2 IMPLANT
TOWEL OR 17X24 6PK STRL BLUE (TOWEL DISPOSABLE) ×2 IMPLANT
TRAY DSU PREP LF (CUSTOM PROCEDURE TRAY) IMPLANT
TUBE ANAEROBIC SPECIMEN COL (MISCELLANEOUS) IMPLANT
TUBE CONNECTING 20X1/4 (TUBING) IMPLANT
YANKAUER SUCT BULB TIP NO VENT (SUCTIONS) IMPLANT

## 2013-08-12 NOTE — Discharge Instructions (Signed)
Take your regular meds Can remove band aids and get incision sites wet in 24 hrs Keep incision sites clean and apply antibiotic ointment daily (bacitracin, neosporin or similar) Call office for follow up appt in 7-8 days    [phone] Take tylenol or motrin for pain

## 2013-08-12 NOTE — Interval H&P Note (Signed)
History and Physical Interval Note:  08/12/2013 7:33 AM  [name]  has presented today for surgery, with the diagnosis of FACIAL SKIN CANCERS   The various methods of treatment have been discussed with the patient and family. After consideration of risks, benefits and other options for treatment, the patient has consented to  Procedure(s): EXCISION FACIAL SKIN CANCER X 2    (MINOR PROCEDURE)  (N/A) as a surgical intervention .  The patient's history has been reviewed, patient examined, no change in status, stable for surgery.  I have reviewed the patient's chart and labs.  Questions were answered to the patient's satisfaction.     [name]

## 2013-08-12 NOTE — Brief Op Note (Signed)
08/12/2013  8:47 AM  PATIENT:  Magdalene River  80 y.o. male  PRE-OPERATIVE DIAGNOSIS:  FACIAL SKIN CANCERS   POST-OPERATIVE DIAGNOSIS:  FACIAL SKIN CANCERS   PROCEDURE:  Procedure(s): EXCISION FACIAL SKIN CANCER X 3   (MINOR PROCEDURE)  (N/A)  SURGEON:  Surgeon(s) and Role:    * Rozetta Nunnery, MD - Primary  PHYSICIAN ASSISTANT:   ASSISTANTS: none   ANESTHESIA:   local  EBL:     BLOOD ADMINISTERED:none  DRAINS: none   LOCAL MEDICATIONS USED:  XYLOCAINE with EPI   7cc  SPECIMEN:  Source of Specimen:  left infraorbital and paranasal. right infraorbital  DISPOSITION OF SPECIMEN:  PATHOLOGY  COUNTS:  YES  TOURNIQUET:  * No tourniquets in log *  DICTATION: .Other Dictation: Dictation Number [id number]  PLAN OF CARE: Discharge to home after PACU  PATIENT DISPOSITION:  PACU - hemodynamically stable.   Delay start of Pharmacological VTE agent (>24hrs) due to surgical blood loss or risk of bleeding: not applicable

## 2013-08-13 ENCOUNTER — Encounter (HOSPITAL_BASED_OUTPATIENT_CLINIC_OR_DEPARTMENT_OTHER): Payer: Self-pay | Admitting: Otolaryngology

## 2013-08-13 NOTE — Op Note (Signed)
[name], [name]              ACCOUNT NO.:  [id number]  MEDICAL RECORD NO.:  [id number]  LOCATION:                                 FACILITY:  PHYSICIAN:  Leonides Sake. Lucia Gaskins, M.D.DATE OF BIRTH:  1927-08-25  DATE OF PROCEDURE:  08/12/2013 DATE OF DISCHARGE:  08/12/2013                              OPERATIVE REPORT   PREOPERATIVE DIAGNOSIS:  Multiple facial basal cell carcinomas,  1.2-cm left infraorbital, 0.8-cm left paranasal, and 6-cm right infraorbital chronic skin ulcers or basal cell carcinomas.  OPERATION:  Excision of three facial skin cancers; 1.2 cm, 0.8 cm, and 0.6 cm.  SURGEON:  Leonides Sake. Lucia Gaskins, MD  ANESTHESIA:  1% Xylocaine with 1:100,000 epinephrine.  COMPLICATIONS:  None.  BRIEF CLINICAL NOTE:  [name] is an 80 year old gentleman who has had a chronic enlarging sore beneath his left eye for several months. On clinical exam, this has a sore with rolled edges, consistent with probable basal cell carcinoma.  Measures approximately 1.2 cm or 12 mm in size.  He also has a small only basal cell with rolled edges next to the left side of his nose, which measures about 8 mm in size.  Then, he has had a newly developed right infraorbital sore over the last couple of months that measures 5 to 6 mm in size.  He was taken to the operating room at this time for excision of three facial sores, consistent with probable basal cell carcinomas.  DESCRIPTION OF PROCEDURE:  The patient was brought to the operating room.  The face was prepped with Betadine solution.  First, the larger left infraorbital area was injected with 2 to 3 mL of Xylocaine with epinephrine.  The sore was elliptically excised and closed with 4-0 Vicryl subcutaneously and 6-0 nylon to reapproximate skin edges. Cautery was used for hemostasis.  Next, the left paranasal lesion was injected with 2 to 3 mL of Xylocaine with epinephrine.  The lesion was elliptically excised and sent to  Pathology.  Hemostasis was obtained with a cautery and then this small defect was closed with a 4-0 Vicryl subcutaneously and 6-0 nylon to reapproximate the skin edges.  Next, the right infraorbital lesion was prepped and injected with Xylocaine with epinephrine about 2 mL.  This was a little bit smaller and was elliptically excised and closed with just three interrupted 6-0 nylon sutures.  Following excision, the wounds were dressed with a Betadine solution and Band-Aid.  The patient tolerated this well.  He was subsequently discharged home.  We will have him follow up in my office in 7 to 8 days to have sutures removed and review pathology.  He was instructed to apply antibiotic ointment to an excision site.         ______________________________ Leonides Sake. Lucia Gaskins, M.D.    CEN/MEDQ  D:  08/12/2013  T:  08/12/2013  Job:  [id number]

## 2013-08-19 ENCOUNTER — Other Ambulatory Visit: Payer: Self-pay | Admitting: *Deleted

## 2013-08-19 ENCOUNTER — Other Ambulatory Visit: Payer: Self-pay | Admitting: Nurse Practitioner

## 2013-08-19 MED ORDER — MEMANTINE HCL 10 MG PO TABS: ORAL_TABLET | ORAL | Status: DC

## 2013-08-20 ENCOUNTER — Other Ambulatory Visit: Payer: Self-pay | Admitting: *Deleted

## 2013-08-20 MED ORDER — HYDRALAZINE HCL 25 MG PO TABS: ORAL_TABLET | ORAL | Status: DC

## 2013-08-20 NOTE — Telephone Encounter (Signed)
[facility]

## 2013-09-18 ENCOUNTER — Other Ambulatory Visit: Payer: Self-pay

## 2013-09-27 ENCOUNTER — Other Ambulatory Visit (HOSPITAL_COMMUNITY): Payer: Self-pay | Admitting: Internal Medicine

## 2013-09-27 ENCOUNTER — Other Ambulatory Visit: Payer: Self-pay | Admitting: Internal Medicine

## 2013-09-27 ENCOUNTER — Other Ambulatory Visit: Payer: Self-pay | Admitting: Adult Health

## 2013-09-29 ENCOUNTER — Other Ambulatory Visit: Payer: Self-pay | Admitting: *Deleted

## 2013-09-29 DIAGNOSIS — F03918 Unspecified dementia, unspecified severity, with other behavioral disturbance: Secondary | ICD-10-CM

## 2013-09-29 DIAGNOSIS — F0391 Unspecified dementia with behavioral disturbance: Secondary | ICD-10-CM

## 2013-09-29 MED ORDER — DIVALPROEX SODIUM 125 MG PO CPSP: ORAL_CAPSULE | ORAL | Status: DC

## 2013-09-29 MED ORDER — DONEPEZIL HCL 10 MG PO TABS: ORAL_TABLET | ORAL | Status: DC

## 2013-09-29 NOTE — Telephone Encounter (Signed)
[facility] [phone]

## 2013-09-29 NOTE — Telephone Encounter (Signed)
[facility]

## 2013-10-13 ENCOUNTER — Other Ambulatory Visit: Payer: Self-pay | Admitting: *Deleted

## 2013-10-13 DIAGNOSIS — F4323 Adjustment disorder with mixed anxiety and depressed mood: Secondary | ICD-10-CM

## 2013-10-13 MED ORDER — CITALOPRAM HYDROBROMIDE 20 MG PO TABS: 20.0000 mg | ORAL_TABLET | Freq: Every day | ORAL | Status: DC

## 2013-10-13 NOTE — Telephone Encounter (Signed)
[place]

## 2013-10-21 ENCOUNTER — Other Ambulatory Visit: Payer: Medicare Other

## 2013-10-23 ENCOUNTER — Ambulatory Visit: Payer: Medicare Other | Admitting: Internal Medicine

## 2013-10-31 ENCOUNTER — Encounter (HOSPITAL_COMMUNITY): Payer: Self-pay | Admitting: Emergency Medicine

## 2013-10-31 ENCOUNTER — Emergency Department (INDEPENDENT_AMBULATORY_CARE_PROVIDER_SITE_OTHER)
Admission: EM | Admit: 2013-10-31 | Discharge: 2013-10-31 | Disposition: A | Discharge status: Home / Self Care | Payer: Medicare Other | Source: Home / Self Care | Attending: Emergency Medicine | Admitting: Emergency Medicine

## 2013-10-31 ENCOUNTER — Telehealth: Payer: Self-pay | Admitting: *Deleted

## 2013-10-31 DIAGNOSIS — R319 Hematuria, unspecified: Secondary | ICD-10-CM

## 2013-10-31 LAB — POCT URINALYSIS DIP (DEVICE)
BILIRUBIN URINE: NEGATIVE
Glucose, UA: NEGATIVE mg/dL
KETONES UR: NEGATIVE mg/dL
Leukocytes, UA: NEGATIVE
Nitrite: NEGATIVE
PH: 5.5 (ref 5.0–8.0)
Protein, ur: 100 mg/dL — AB
Specific Gravity, Urine: 1.025 (ref 1.005–1.030)
Urobilinogen, UA: 0.2 mg/dL (ref 0.0–1.0)

## 2013-10-31 MED ORDER — CEPHALEXIN 500 MG PO CAPS: 500.0000 mg | ORAL_CAPSULE | Freq: Three times a day (TID) | ORAL | Status: DC

## 2013-10-31 MED ORDER — PHENAZOPYRIDINE HCL 200 MG PO TABS: 200.0000 mg | ORAL_TABLET | Freq: Three times a day (TID) | ORAL | Status: DC | PRN

## 2013-10-31 NOTE — ED Provider Notes (Signed)
Chief Complaint   Chief Complaint  Patient presents with  . Hematuria    History of Present Illness   [name] is an 79 year old male who has had a two-day history of bright red hematuria and slight dysuria. He denies any fever, chills, lower back pain, lower abdominal pain, frequency, urgency, or change in his urinary stream. The patient states he has had this before and was treated with an antibiotic. He does not think he's ever seen a urologist for it. He's not taking any anticoagulants other than 81 mg of aspirin per day.  Review of Systems   Other than as noted above, the patient denies any of the following symptoms: General:  No fever or chills. GI:  No abdominal pain, back pain, nausea, or vomiting. GU: Hematuria, urethral discharge, penile lesions, penile pain, testicular pain, swelling, or mass, or inguinal lymphadenopathy.  [place]   Past medical history, family history, social history, meds, and allergies were reviewed.  He was in the hospital last December with bradycardia. His pulse was down to the 30s. It was found that whenever he would get up and walk around his pulse would increase back up into the normal range. His pulse was very low when he is either sleeping or lying down. It was not felt that he needed any type of pacemaker or any other treatment. His blood pressure remained stable throughout his hospital stay. He also has dementia and has a caretaker who takes care of him. He has numerous medical problems including diabetes, hypertension, hyperlipidemia, colon cancer, abdominal aortic aneurysm, coronary artery disease, anemia, gout, and chronic kidney disease. Current meds include alprazolam, aspirin, Celexa, Depakote, Aricept, and sure, a progressively, lisinopril, loratadine, meclizine, Namenda, Remeron, Mycostatin cream, and simvastatin. Dr. Elder Negus is his primary care physician.  Physical Exam    Vital signs:  BP 166/53  Pulse 46  Temp(Src) 97.9 F (36.6  C) (Oral)  Resp 16  SpO2 100% Gen:  Alert, oriented, in no distress. Lungs:  Clear to auscultation, no wheezes, rales or rhonchi. Heart:  Regular rhythm, no gallop or murmer. Abdomen:  Flat and soft.  No tenderness to palpation, guarding, or rebound.  No hepato-splenomegaly or mass.  Bowel sounds were normally active.  No hernia. Back:  No CVA tenderness.  Skin:  Clear, warm and dry.  Labs   Results for orders placed during the hospital encounter of 10/31/13  POCT URINALYSIS DIP (DEVICE)      Result Value Ref Range   Glucose, UA NEGATIVE  NEGATIVE mg/dL   Bilirubin Urine NEGATIVE  NEGATIVE   Ketones, ur NEGATIVE  NEGATIVE mg/dL   Specific Gravity, Urine 1.025  1.005 - 1.030   Hgb urine dipstick LARGE (*) NEGATIVE   pH 5.5  5.0 - 8.0   Protein, ur 100 (*) NEGATIVE mg/dL   Urobilinogen, UA 0.2  0.0 - 1.0 mg/dL   Nitrite NEGATIVE  NEGATIVE   Leukocytes, UA NEGATIVE  NEGATIVE    The urine was cultured.  Assessment   The encounter diagnosis was Hematuria.   Most likely cause for this is urinary tract infection. Kidney stone or tumor are also in the differential. He's alert, nontoxic, in no distress, and other than some mild discomfort when he urinates is not having any symptoms with hematuria. A culture is pending. Will treat with cephalexin. I suggest he followup with Dr. Nyoka Cowden in about 2 weeks. He was warned about symptoms of pyelonephritis including fever, persistent vomiting, or severe pain and to  return here or to the emergency room if any of these things should occur. I told him this and his caregiver and she is going to monitor him closely.  Plan   1.  Meds:  The following meds were prescribed:   Discharge Medication List as of 10/31/2013  1:58 PM    START taking these medications   Details  cephALEXin (KEFLEX) 500 MG capsule Take 1 capsule (500 mg total) by mouth 3 (three) times daily., Starting 10/31/2013, Until Discontinued, Normal    phenazopyridine (PYRIDIUM) 200 MG  tablet Take 1 tablet (200 mg total) by mouth 3 (three) times daily as needed for pain., Starting 10/31/2013, Until Discontinued, Normal        2.  Patient Education/Counseling:  The patient was given appropriate handouts, self care instructions, and instructed in symptomatic relief.    3.  Follow up:  The patient was told to follow up here if no better in 3 to 4 days, or sooner if becoming worse in any way, and given some red flag symptoms such as fever, persistent vomiting, pain, or difficulty urinating which would prompt immediate return.        Harden Mo, MD 10/31/13 1452

## 2013-10-31 NOTE — ED Notes (Signed)
Reports blood in urine, noticed last night  The blood in urine.  Pain with urination, history of uti.  pcp instructed care giver to come to ucc for evaluation.

## 2013-10-31 NOTE — Discharge Instructions (Signed)
Hematuria, Adult °Hematuria is blood in your urine. It can be caused by a bladder infection, kidney infection, prostate infection, kidney stone, or cancer of your urinary tract. Infections can usually be treated with medicine, and a kidney stone usually will pass through your urine. If neither of these is the cause of your hematuria, further workup to find out the reason may be needed. °It is very important that you tell your health care provider about any blood you see in your urine, even if the blood stops without treatment or happens without causing pain. Blood in your urine that happens and then stops and then happens again can be a symptom of a very serious condition. Also, pain is not a symptom in the initial stages of many urinary cancers. °HOME CARE INSTRUCTIONS  °· Drink lots of fluid, 3 4 quarts a day. If you have been diagnosed with an infection, cranberry juice is especially recommended, in addition to large amounts of water. °· Avoid caffeine, tea, and carbonated beverages, because they tend to irritate the bladder. °· Avoid alcohol because it may irritate the prostate. °· Only take over-the-counter or prescription medicines for pain, discomfort, or fever as directed by your health care provider. °· If you have been diagnosed with a kidney stone, follow your health care provider's instructions regarding straining your urine to catch the stone. °· Empty your bladder often. Avoid holding urine for long periods of time. °· After a bowel movement, women should cleanse front to back. Use each tissue only once. °· Empty your bladder before and after sexual intercourse if you are a male. °SEEK MEDICAL CARE IF: °You develop back pain, fever, a feeling of sickness in your stomach (nausea), or vomiting or if your symptoms are not better in 3 days. Return sooner if you are getting worse. °SEEK IMMEDIATE MEDICAL CARE IF:  °· You have a persistent fever, with a temperature of 101.8°F (38.8°C) or greater. °· You  develop severe vomiting and are unable to keep the medicine down. °· You develop severe back or abdominal pain despite taking your medicines. °· You begin passing a large amount of blood or clots in your urine. °· You feel extremely weak or faint, or you pass out. °MAKE SURE YOU:  °· Understand these instructions. °· Will watch your condition. °· Will get help right away if you are not doing well or get worse. °Document Released: 05/29/2005 Document Revised: 03/19/2013 Document Reviewed: 01/27/2013 °ExitCare® Patient Information ©2014 [employer]. ° °

## 2013-10-31 NOTE — Telephone Encounter (Signed)
Patient caregiver called and stated that patient has blood in urine and has pain when he tries to urinate. Informed them we had no available appointments here and to take him to the [facility] [place] to be evaluated. She agreed.

## 2013-11-01 LAB — URINE CULTURE: Colony Count: 45000

## 2013-11-14 ENCOUNTER — Emergency Department (INDEPENDENT_AMBULATORY_CARE_PROVIDER_SITE_OTHER)
Admission: EM | Admit: 2013-11-14 | Discharge: 2013-11-14 | Disposition: A | Discharge status: Nursing Home (Includes ICF) | Payer: Medicare Other | Source: Home / Self Care | Attending: Family Medicine | Admitting: Family Medicine

## 2013-11-14 ENCOUNTER — Encounter (HOSPITAL_COMMUNITY): Payer: Self-pay | Admitting: Emergency Medicine

## 2013-11-14 ENCOUNTER — Emergency Department (HOSPITAL_COMMUNITY): Payer: Medicare Other

## 2013-11-14 ENCOUNTER — Observation Stay (HOSPITAL_COMMUNITY)
Admission: EM | Admit: 2013-11-14 | Discharge: 2013-11-16 | Disposition: A | Discharge status: Home / Self Care | Payer: Medicare Other | Attending: Internal Medicine | Admitting: Internal Medicine

## 2013-11-14 DIAGNOSIS — R319 Hematuria, unspecified: Secondary | ICD-10-CM | POA: Diagnosis present

## 2013-11-14 DIAGNOSIS — I714 Abdominal aortic aneurysm, without rupture, unspecified: Secondary | ICD-10-CM | POA: Insufficient documentation

## 2013-11-14 DIAGNOSIS — C189 Malignant neoplasm of colon, unspecified: Secondary | ICD-10-CM

## 2013-11-14 DIAGNOSIS — D649 Anemia, unspecified: Secondary | ICD-10-CM

## 2013-11-14 DIAGNOSIS — N390 Urinary tract infection, site not specified: Secondary | ICD-10-CM | POA: Diagnosis present

## 2013-11-14 DIAGNOSIS — F329 Major depressive disorder, single episode, unspecified: Secondary | ICD-10-CM | POA: Insufficient documentation

## 2013-11-14 DIAGNOSIS — F0390 Unspecified dementia without behavioral disturbance: Secondary | ICD-10-CM

## 2013-11-14 DIAGNOSIS — N289 Disorder of kidney and ureter, unspecified: Secondary | ICD-10-CM

## 2013-11-14 DIAGNOSIS — F3289 Other specified depressive episodes: Secondary | ICD-10-CM | POA: Insufficient documentation

## 2013-11-14 DIAGNOSIS — N400 Enlarged prostate without lower urinary tract symptoms: Secondary | ICD-10-CM | POA: Insufficient documentation

## 2013-11-14 DIAGNOSIS — I2510 Atherosclerotic heart disease of native coronary artery without angina pectoris: Secondary | ICD-10-CM | POA: Insufficient documentation

## 2013-11-14 DIAGNOSIS — N183 Chronic kidney disease, stage 3 unspecified: Secondary | ICD-10-CM | POA: Insufficient documentation

## 2013-11-14 DIAGNOSIS — E860 Dehydration: Secondary | ICD-10-CM | POA: Insufficient documentation

## 2013-11-14 DIAGNOSIS — E875 Hyperkalemia: Secondary | ICD-10-CM | POA: Insufficient documentation

## 2013-11-14 DIAGNOSIS — F411 Generalized anxiety disorder: Secondary | ICD-10-CM | POA: Insufficient documentation

## 2013-11-14 DIAGNOSIS — Z9049 Acquired absence of other specified parts of digestive tract: Secondary | ICD-10-CM

## 2013-11-14 DIAGNOSIS — Z951 Presence of aortocoronary bypass graft: Secondary | ICD-10-CM | POA: Insufficient documentation

## 2013-11-14 DIAGNOSIS — R1032 Left lower quadrant pain: Secondary | ICD-10-CM

## 2013-11-14 DIAGNOSIS — I129 Hypertensive chronic kidney disease with stage 1 through stage 4 chronic kidney disease, or unspecified chronic kidney disease: Principal | ICD-10-CM | POA: Insufficient documentation

## 2013-11-14 DIAGNOSIS — I10 Essential (primary) hypertension: Secondary | ICD-10-CM | POA: Diagnosis present

## 2013-11-14 DIAGNOSIS — Q602 Renal agenesis, unspecified: Secondary | ICD-10-CM | POA: Insufficient documentation

## 2013-11-14 DIAGNOSIS — R108A3 Suprapubic tenderness: Secondary | ICD-10-CM | POA: Diagnosis present

## 2013-11-14 DIAGNOSIS — Z85038 Personal history of other malignant neoplasm of large intestine: Secondary | ICD-10-CM | POA: Insufficient documentation

## 2013-11-14 DIAGNOSIS — N189 Chronic kidney disease, unspecified: Secondary | ICD-10-CM | POA: Diagnosis present

## 2013-11-14 DIAGNOSIS — R10819 Abdominal tenderness, unspecified site: Secondary | ICD-10-CM

## 2013-11-14 DIAGNOSIS — I252 Old myocardial infarction: Secondary | ICD-10-CM | POA: Insufficient documentation

## 2013-11-14 DIAGNOSIS — Q605 Renal hypoplasia, unspecified: Secondary | ICD-10-CM

## 2013-11-14 DIAGNOSIS — N3289 Other specified disorders of bladder: Secondary | ICD-10-CM | POA: Diagnosis present

## 2013-11-14 LAB — CBC WITH DIFFERENTIAL/PLATELET
BASOS ABS: 0 10*3/uL (ref 0.0–0.1)
BASOS PCT: 1 % (ref 0–1)
EOS ABS: 0.2 10*3/uL (ref 0.0–0.7)
EOS PCT: 2 % (ref 0–5)
HEMATOCRIT: 34.1 % — AB (ref 39.0–52.0)
Hemoglobin: 10.7 g/dL — ABNORMAL LOW (ref 13.0–17.0)
LYMPHS PCT: 16 % (ref 12–46)
Lymphs Abs: 1.3 10*3/uL (ref 0.7–4.0)
MCH: 30.7 pg (ref 26.0–34.0)
MCHC: 31.4 g/dL (ref 30.0–36.0)
MCV: 97.7 fL (ref 78.0–100.0)
MONO ABS: 0.5 10*3/uL (ref 0.1–1.0)
Monocytes Relative: 6 % (ref 3–12)
Neutro Abs: 6.2 10*3/uL (ref 1.7–7.7)
Neutrophils Relative %: 75 % (ref 43–77)
Platelets: 180 10*3/uL (ref 150–400)
RBC: 3.49 MIL/uL — ABNORMAL LOW (ref 4.22–5.81)
RDW: 14 % (ref 11.5–15.5)
WBC: 8.2 10*3/uL (ref 4.0–10.5)

## 2013-11-14 LAB — POCT URINALYSIS DIP (DEVICE)
Bilirubin Urine: NEGATIVE
Glucose, UA: NEGATIVE mg/dL
HGB URINE DIPSTICK: NEGATIVE
KETONES UR: NEGATIVE mg/dL
Leukocytes, UA: NEGATIVE
Nitrite: NEGATIVE
UROBILINOGEN UA: 0.2 mg/dL (ref 0.0–1.0)
pH: 5.5 (ref 5.0–8.0)

## 2013-11-14 LAB — COMPREHENSIVE METABOLIC PANEL
ALT: 11 U/L (ref 0–53)
AST: 17 U/L (ref 0–37)
Albumin: 3.4 g/dL — ABNORMAL LOW (ref 3.5–5.2)
Alkaline Phosphatase: 76 U/L (ref 39–117)
BUN: 36 mg/dL — ABNORMAL HIGH (ref 6–23)
CALCIUM: 9.2 mg/dL (ref 8.4–10.5)
CO2: 23 mEq/L (ref 19–32)
CREATININE: 2.18 mg/dL — AB (ref 0.50–1.35)
Chloride: 105 mEq/L (ref 96–112)
GFR calc Af Amer: 30 mL/min — ABNORMAL LOW (ref >90)
GFR, EST NON AFRICAN AMERICAN: 26 mL/min — AB (ref >90)
Glucose, Bld: 113 mg/dL — ABNORMAL HIGH (ref 70–99)
Potassium: 5 mEq/L (ref 3.7–5.3)
Sodium: 141 mEq/L (ref 137–147)
TOTAL PROTEIN: 6.9 g/dL (ref 6.0–8.3)
Total Bilirubin: 0.2 mg/dL — ABNORMAL LOW (ref 0.3–1.2)

## 2013-11-14 LAB — LIPASE, BLOOD: Lipase: 85 U/L — ABNORMAL HIGH (ref 11–59)

## 2013-11-14 MED ORDER — IOHEXOL 300 MG/ML  SOLN: 50.0000 mL | Freq: Once | INTRAMUSCULAR | Status: AC | PRN

## 2013-11-14 MED ORDER — SODIUM CHLORIDE 0.9 % IV SOLN
Freq: Once | INTRAVENOUS | Status: AC
  Administered 2013-11-14: 23:00:00 via INTRAVENOUS

## 2013-11-14 NOTE — ED Notes (Signed)
PA Student at the bedside.

## 2013-11-14 NOTE — ED Notes (Signed)
Patient returned from CT

## 2013-11-14 NOTE — ED Notes (Signed)
Pt here for follow up visit from 5/22.  Unable to get in with primary doctor until July.  Also caregiver states that he is having some left sided pain.  Denies injury.

## 2013-11-14 NOTE — ED Notes (Signed)
Pt sent over from urgent care, went for a follow up after a kidney infection, sts he was sent here for a CT scan because pt is having left lower abd pain and the provider at urgent care wants to rule out diverticulitis. Denies n/v/d/fevers at home/blood in stools. Nad, skin warm and dry, resp e/u.

## 2013-11-14 NOTE — ED Provider Notes (Signed)
[name] is a 80 y.o. male who presents to Urgent Care today for left lower quadrant abdominal pain. Pain started this morning. No fever chills nausea vomiting or diarrhea. Patient has a history of right colectomy for colon cancer and a history of diverticula. He denies any history of diverticulitis. He denies any fevers or chills. Additionally he was treated recently for a urinary tract infection. He's feeling much better from that standpoint.   Past Medical History  Diagnosis Date  . Arthritis   . Depression   . Diabetes mellitus   . Hypertension   . Hyperlipidemia   . Colon cancer   . AAA (abdominal aortic aneurysm) 2011  . CAD (coronary artery disease) 2003  . Anemia   . Diverticula of colon 2013  . Hypertrophy of prostate without urinary obstruction and other lower urinary tract symptoms (LUTS) 2008  . Hyperpotassemia 2008  . Gout, unspecified 2008  . Impacted cerumen 06/20/2006  . First degree atrioventricular block 2008  . Abnormality of gait 2007  . Other specified cardiac dysrhythmias(427.89) 2005  . Unspecified disorder of kidney and ureter 2005  . Edema 2005  . Malignant neoplasm of colon, unspecified site 2004  . Unspecified hereditary and idiopathic peripheral neuropathy 2004  . Chest pain, unspecified 2003  . Pain in joint, pelvic region and thigh 2004  . Pain in limb 2003  . Myocardial infarction 1984  . Exertional shortness of breath   . Pneumonia ~ 02/2013    "hospitalized for double pneumonia" (05/15/2013)  . Anxiety   . Chronic kidney disease     "born w/only 1 kidney" (05/15/2013)  . Chronic kidney disease (CKD), stage III (moderate)     [name] 05/14/2013 (05/15/2013)  . Postural dizziness     hospitalized 05/14/2013   History  Substance Use Topics  . Smoking status: Former Smoker -- 1.00 packs/day for 35 years    Types: Cigarettes    Quit date: 06/12/1982  . Smokeless tobacco: Former Systems developer    Types: Snuff  . Alcohol Use: Yes     Comment:  05/15/2013 "have a beer sometimes; not very often"   ROS as above Medications: No current facility-administered medications for this encounter.   Current Outpatient Prescriptions  Medication Sig Dispense Refill  . ALPRAZolam (XANAX) 0.25 MG tablet TAKE 1 TABLET TWICE A DAY AS NEEDED  60 tablet  1  . aspirin 81 MG EC tablet TAKE 1 TABLET EVERY DAY  30 tablet  5  . aspirin 81 MG tablet Take 81 mg by mouth daily. Take 1 tablet daily to prevent stroke and heart attack.      . citalopram (CELEXA) 20 MG tablet Take 1 tablet (20 mg total) by mouth daily.  90 tablet  3  . divalproex (DEPAKOTE SPRINKLE) 125 MG capsule Take one capsule by mouth twice daily  180 capsule  3  . donepezil (ARICEPT) 10 MG tablet Take one tablet by mouth once at bedtime to preserve memory  90 tablet  3  . feeding supplement, ENSURE COMPLETE, (ENSURE COMPLETE) LIQD Take 237 mLs by mouth 2 (two) times daily between meals.  30 Bottle  5  . hydrALAZINE (APRESOLINE) 25 MG tablet Take three tablets by mouth three times daily. [facility] must fill 90 day supply  810 tablet  3  . lisinopril (PRINIVIL,ZESTRIL) 20 MG tablet Take 1 tablet (20 mg total) by mouth daily.  90 tablet  1  . loratadine (CLARITIN) 10 MG tablet Take 10 mg by mouth daily.      Marland Kitchen  meclizine (ANTIVERT) 12.5 MG tablet Take 2.5 tablets (31.25 mg total) by mouth 3 (three) times daily as needed for dizziness.  30 tablet  0  . memantine (NAMENDA) 10 MG tablet Take one tablet by mouth twice daily to help preserve memory  180 tablet  3  . mirtazapine (REMERON) 15 MG tablet TAKE 1/2 TABLET BY MOUTH EVERY NIGHT  45 tablet  1  . nystatin cream (MYCOSTATIN) Apply 1 application topically 2 (two) times daily.  30 g  3  . phenazopyridine (PYRIDIUM) 200 MG tablet Take 1 tablet (200 mg total) by mouth 3 (three) times daily as needed for pain.  15 tablet  0  . simvastatin (ZOCOR) 40 MG tablet Take 1 tablet (40 mg total) by mouth at bedtime. Take 1 tablet once daily to lower cholesterol.  30  tablet  5  . ZOSTER VACCINE LIVE [place] Inject 0.65 mLs into the skin.      . cephALEXin (KEFLEX) 500 MG capsule Take 1 capsule (500 mg total) by mouth 3 (three) times daily.  30 capsule  0    Exam:  BP 155/51  Pulse 58  Temp(Src) 98.6 F (37 C) (Oral)  Resp 16  SpO2 98% Gen: Well NAD HEENT: EOMI,  MMM Lungs: Normal work of breathing. CTABL Heart: RRR no MRG Abd: NABS, Soft. Nondistended. Tender palpation left lower quadrant with guarding. Exts: Brisk capillary refill, warm and well perfused.   Results for orders placed during the hospital encounter of 11/14/13 (from the past 24 hour(s))  POCT URINALYSIS DIP (DEVICE)     Status: Abnormal   Collection Time    11/14/13  3:17 PM      Result Value Ref Range   Glucose, UA NEGATIVE  NEGATIVE mg/dL   Bilirubin Urine NEGATIVE  NEGATIVE   Ketones, ur NEGATIVE  NEGATIVE mg/dL   Specific Gravity, Urine >=1.030  1.005 - 1.030   Hgb urine dipstick NEGATIVE  NEGATIVE   pH 5.5  5.0 - 8.0   Protein, ur >=300 (*) NEGATIVE mg/dL   Urobilinogen, UA 0.2  0.0 - 1.0 mg/dL   Nitrite NEGATIVE  NEGATIVE   Leukocytes, UA NEGATIVE  NEGATIVE   No results found.  Assessment and Plan: 80 y.o. male with left lower quadrant abdominal pain. Concern for diverticulitis versus colitis. Plan to transfer to the emergency room via shuttle for further evaluation and management.  Discussed warning signs or symptoms. Please see discharge instructions. Patient expresses understanding.    Gregor Hams, MD 11/14/13 [phone]

## 2013-11-14 NOTE — ED Notes (Addendum)
Patient finished Contrast. CT called. CT stated patient would not be scanned till 2130. Patient notified.

## 2013-11-14 NOTE — ED Provider Notes (Signed)
CSN: [id number]     Arrival date & time 11/14/13  1545 History   First MD Initiated Contact with Patient 11/14/13 1957     Chief Complaint  Patient presents with  . Abdominal Pain     (Consider location/radiation/quality/duration/timing/severity/associated sxs/prior Treatment) HPI Comments: Patient was recently treated with Cipro for UTI.  He went to urgent care for followup when he developed left mid to lower quadrant pain.  He was sent to the emergency department for CT scan due to his history of colon cancer and diverticular disease.  His urine and labs at urgent care or within normal limits.  She does not have any diarrhea, constipation, or vomiting.  He also denies fever  Patient is a 80 y.o. male presenting with abdominal pain. The history is provided by the patient.  Abdominal Pain Pain location:  LUQ and LLQ Pain quality: aching   Pain radiates to:  Does not radiate Pain severity:  Mild Onset quality:  Sudden Timing:  Constant Chronicity:  New Relieved by:  None tried Worsened by:  Nothing tried Ineffective treatments:  None tried Associated symptoms: no chest pain, no chills, no constipation, no diarrhea, no dysuria, no fever, no nausea and no vomiting     Past Medical History  Diagnosis Date  . Arthritis   . Depression   . Hypertension   . Hyperlipidemia   . Colon cancer   . AAA (abdominal aortic aneurysm) 2011  . CAD (coronary artery disease) 2003  . Anemia   . Diverticula of colon 2013  . Hypertrophy of prostate without urinary obstruction and other lower urinary tract symptoms (LUTS) 2008  . Hyperpotassemia 2008  . Gout, unspecified 2008  . Impacted cerumen 06/20/2006  . First degree atrioventricular block 2008  . Abnormality of gait 2007  . Other specified cardiac dysrhythmias(427.89) 2005  . Unspecified disorder of kidney and ureter 2005  . Edema 2005  . Malignant neoplasm of colon, unspecified site 2004  . Unspecified hereditary and idiopathic peripheral  neuropathy 2004  . Chest pain, unspecified 2003  . Pain in joint, pelvic region and thigh 2004  . Pain in limb 2003  . Myocardial infarction 1984  . Exertional shortness of breath   . Pneumonia ~ 02/2013    "hospitalized for double pneumonia" (05/15/2013)  . Anxiety   . Chronic kidney disease     "born w/only 1 kidney" (05/15/2013)  . Chronic kidney disease (CKD), stage III (moderate)     [name] 05/14/2013 (05/15/2013)  . Postural dizziness     hospitalized 05/14/2013   Past Surgical History  Procedure Laterality Date  . Mediastinotomy    . Right colectomy  2004  . Inguinal hernia repair Right 1952    In [place]  . Coronary artery bypass graft  1996    Bartle, MD  . Cardiac catheterization    . Colon surgery    . Lesion removal N/A 08/12/2013    Procedure: EXCISION FACIAL SKIN CANCER X 3    (MINOR PROCEDURE) ;  Surgeon: Rozetta Nunnery, MD;  Location: [place];  Service: ENT;  Laterality: N/A;   Family History  Problem Relation Age of Onset  . Heart disease Father   . Pancreatic cancer Sister   . Cancer Sister     liver  . Cancer Brother     Leukemia  . Cancer Mother     ?   History  Substance Use Topics  . Smoking status: Former Smoker -- 1.00 packs/day for  35 years    Types: Cigarettes    Quit date: 06/12/1982  . Smokeless tobacco: Former Systems developer    Types: Snuff  . Alcohol Use: Yes     Comment: 05/15/2013 "have a beer sometimes; not very often"    Review of Systems  Constitutional: Negative for fever and chills.  Cardiovascular: Negative for chest pain.  Gastrointestinal: Positive for abdominal pain. Negative for nausea, vomiting, diarrhea and constipation.  Genitourinary: Negative for dysuria and flank pain.  Neurological: Negative for dizziness.  All other systems reviewed and are negative.     Allergies  Diovan  Home Medications   Prior to Admission medications   Medication Sig Start Date End Date Taking? Authorizing Provider    ALPRAZolam (XANAX) 0.25 MG tablet Take 0.25 mg by mouth 2 (two) times daily as needed for anxiety.   Yes Historical Provider, MD  aspirin EC 81 MG tablet Take 81 mg by mouth daily.   Yes Historical Provider, MD  citalopram (CELEXA) 20 MG tablet Take 1 tablet (20 mg total) by mouth daily. 10/13/13  Yes Tiffany L Reed, DO  divalproex (DEPAKOTE SPRINKLE) 125 MG capsule Take 125 mg by mouth 2 (two) times daily.   Yes Historical Provider, MD  donepezil (ARICEPT) 10 MG tablet Take 10 mg by mouth at bedtime.   Yes Historical Provider, MD  feeding supplement, ENSURE COMPLETE, (ENSURE COMPLETE) LIQD Take 237 mLs by mouth 2 (two) times daily between meals. 06/09/13  Yes Tiffany L Reed, DO  hydrALAZINE (APRESOLINE) 25 MG tablet Take 75 mg by mouth 3 (three) times daily.   Yes Historical Provider, MD  loratadine (CLARITIN) 10 MG tablet Take 10 mg by mouth daily.   Yes Historical Provider, MD  meclizine (ANTIVERT) 12.5 MG tablet Take 2.5 tablets (31.25 mg total) by mouth 3 (three) times daily as needed for dizziness. 05/17/13  Yes Thurnell Lose, MD  memantine (NAMENDA) 10 MG tablet Take 10 mg by mouth 2 (two) times daily.   Yes Historical Provider, MD  mirtazapine (REMERON) 15 MG tablet Take 7.5 mg by mouth at bedtime.   Yes Historical Provider, MD  nystatin cream (MYCOSTATIN) Apply 1 application topically 2 (two) times daily. 06/09/13  Yes Tiffany L Reed, DO  ZOSTER VACCINE LIVE [place] Inject 0.65 mLs into the skin.   Yes Historical Provider, MD  amLODipine (NORVASC) 5 MG tablet Take 1 tablet (5 mg total) by mouth daily. 11/16/13   Geradine Girt, DO  atorvastatin (LIPITOR) 20 MG tablet Take 1 tablet (20 mg total) by mouth at bedtime. 11/16/13   Geradine Girt, DO   BP 138/60  Pulse 56  Temp(Src) 98 F (36.7 C) (Oral)  Resp 18  Ht 6' (1.829 m)  Wt 190 lb 7.6 oz (86.4 kg)  BMI 25.83 kg/m2  SpO2 97% Physical Exam  Nursing note and vitals reviewed. Constitutional: He appears well-developed and well-nourished.   HENT:  Head: Normocephalic.  Right Ear: External ear normal.  Left Ear: External ear normal.  Eyes: Pupils are equal, round, and reactive to light.  Neck: Normal range of motion.  Cardiovascular: Normal rate and regular rhythm.   Pulmonary/Chest: Effort normal.  Abdominal: Soft. Bowel sounds are normal. He exhibits no distension. There is tenderness.    Musculoskeletal: Normal range of motion.  Neurological: He is alert.  Skin: Skin is warm.    ED Course  Procedures (including critical care time) Labs Review Labs Reviewed  CBC WITH DIFFERENTIAL - Abnormal; Notable for the following:  RBC 3.49 (*)    Hemoglobin 10.7 (*)    HCT 34.1 (*)    All other components within normal limits  COMPREHENSIVE METABOLIC PANEL - Abnormal; Notable for the following:    Glucose, Bld 113 (*)    BUN 36 (*)    Creatinine, Ser 2.18 (*)    Albumin 3.4 (*)    Total Bilirubin 0.2 (*)    GFR calc non Af Amer 26 (*)    GFR calc Af Amer 30 (*)    All other components within normal limits  LIPASE, BLOOD - Abnormal; Notable for the following:    Lipase 85 (*)    All other components within normal limits  COMPREHENSIVE METABOLIC PANEL - Abnormal; Notable for the following:    Potassium 5.5 (*)    BUN 35 (*)    Creatinine, Ser 2.10 (*)    Albumin 3.1 (*)    Total Bilirubin 0.2 (*)    GFR calc non Af Amer 27 (*)    GFR calc Af Amer 32 (*)    All other components within normal limits  CBC - Abnormal; Notable for the following:    RBC 3.24 (*)    Hemoglobin 9.9 (*)    HCT 31.6 (*)    All other components within normal limits  CBC - Abnormal; Notable for the following:    RBC 3.22 (*)    Hemoglobin 9.9 (*)    HCT 31.0 (*)    All other components within normal limits  BASIC METABOLIC PANEL - Abnormal; Notable for the following:    Potassium 5.7 (*)    Glucose, Bld 107 (*)    BUN 39 (*)    Creatinine, Ser 2.16 (*)    GFR calc non Af Amer 27 (*)    GFR calc Af Amer 31 (*)    All other  components within normal limits  MRSA PCR SCREENING  PROTIME-INR    Imaging Review No results found.   EKG Interpretation None      MDM  Patient's CT scan, labs and urine, all reviewed nothing within the CT scan, is concerning for a return of his cancer, or an explanation for his pain.  His urine is clean, without signs of infection.  His white count is normal.  Of note, his creatinine has doubled since his last lateral within our system in December of 2013 gone from 1.77-2.65, which is concerning with this patient with one kidney.  He will be admitted for gentle hydration and reevaluation Final diagnoses:  Acute renal insufficiency         Garald Balding, NP 11/19/13 2026

## 2013-11-15 ENCOUNTER — Encounter (HOSPITAL_COMMUNITY): Payer: Self-pay | Admitting: *Deleted

## 2013-11-15 DIAGNOSIS — R10819 Abdominal tenderness, unspecified site: Secondary | ICD-10-CM

## 2013-11-15 DIAGNOSIS — N3289 Other specified disorders of bladder: Secondary | ICD-10-CM | POA: Diagnosis present

## 2013-11-15 DIAGNOSIS — N189 Chronic kidney disease, unspecified: Secondary | ICD-10-CM | POA: Diagnosis present

## 2013-11-15 DIAGNOSIS — F0390 Unspecified dementia without behavioral disturbance: Secondary | ICD-10-CM

## 2013-11-15 DIAGNOSIS — I10 Essential (primary) hypertension: Secondary | ICD-10-CM | POA: Diagnosis present

## 2013-11-15 DIAGNOSIS — N390 Urinary tract infection, site not specified: Secondary | ICD-10-CM | POA: Diagnosis present

## 2013-11-15 DIAGNOSIS — D649 Anemia, unspecified: Secondary | ICD-10-CM

## 2013-11-15 DIAGNOSIS — Z9889 Other specified postprocedural states: Secondary | ICD-10-CM

## 2013-11-15 DIAGNOSIS — C189 Malignant neoplasm of colon, unspecified: Secondary | ICD-10-CM

## 2013-11-15 DIAGNOSIS — N289 Disorder of kidney and ureter, unspecified: Secondary | ICD-10-CM

## 2013-11-15 DIAGNOSIS — R319 Hematuria, unspecified: Secondary | ICD-10-CM

## 2013-11-15 LAB — CBC
HEMATOCRIT: 31.6 % — AB (ref 39.0–52.0)
HEMOGLOBIN: 9.9 g/dL — AB (ref 13.0–17.0)
MCH: 30.6 pg (ref 26.0–34.0)
MCHC: 31.3 g/dL (ref 30.0–36.0)
MCV: 97.5 fL (ref 78.0–100.0)
Platelets: 159 10*3/uL (ref 150–400)
RBC: 3.24 MIL/uL — ABNORMAL LOW (ref 4.22–5.81)
RDW: 13.8 % (ref 11.5–15.5)
WBC: 5.3 10*3/uL (ref 4.0–10.5)

## 2013-11-15 LAB — COMPREHENSIVE METABOLIC PANEL
ALBUMIN: 3.1 g/dL — AB (ref 3.5–5.2)
ALT: 10 U/L (ref 0–53)
AST: 16 U/L (ref 0–37)
Alkaline Phosphatase: 67 U/L (ref 39–117)
BUN: 35 mg/dL — ABNORMAL HIGH (ref 6–23)
CO2: 24 mEq/L (ref 19–32)
Calcium: 8.8 mg/dL (ref 8.4–10.5)
Chloride: 110 mEq/L (ref 96–112)
Creatinine, Ser: 2.1 mg/dL — ABNORMAL HIGH (ref 0.50–1.35)
GFR calc Af Amer: 32 mL/min — ABNORMAL LOW (ref >90)
GFR calc non Af Amer: 27 mL/min — ABNORMAL LOW (ref >90)
GLUCOSE: 82 mg/dL (ref 70–99)
Potassium: 5.5 mEq/L — ABNORMAL HIGH (ref 3.7–5.3)
Sodium: 145 mEq/L (ref 137–147)
TOTAL PROTEIN: 6.3 g/dL (ref 6.0–8.3)
Total Bilirubin: 0.2 mg/dL — ABNORMAL LOW (ref 0.3–1.2)

## 2013-11-15 LAB — PROTIME-INR
INR: 1.01 (ref 0.00–1.49)
Prothrombin Time: 13.1 seconds (ref 11.6–15.2)

## 2013-11-15 LAB — MRSA PCR SCREENING: MRSA BY PCR: NEGATIVE

## 2013-11-15 MED ORDER — DIVALPROEX SODIUM 125 MG PO CPSP
125.0000 mg | ORAL_CAPSULE | Freq: Two times a day (BID) | ORAL | Status: DC
  Administered 2013-11-15 – 2013-11-16 (×3): 125 mg via ORAL
  Filled 2013-11-15 (×4): qty 1

## 2013-11-15 MED ORDER — ENSURE COMPLETE PO LIQD
237.0000 mL | Freq: Two times a day (BID) | ORAL | Status: DC
  Administered 2013-11-15 – 2013-11-16 (×3): 237 mL via ORAL

## 2013-11-15 MED ORDER — HEPARIN SODIUM (PORCINE) 5000 UNIT/ML IJ SOLN
5000.0000 [IU] | Freq: Three times a day (TID) | INTRAMUSCULAR | Status: DC
  Administered 2013-11-15 – 2013-11-16 (×4): 5000 [IU] via SUBCUTANEOUS
  Filled 2013-11-15 (×7): qty 1

## 2013-11-15 MED ORDER — SODIUM CHLORIDE 0.9 % IJ SOLN
3.0000 mL | Freq: Two times a day (BID) | INTRAMUSCULAR | Status: DC
  Administered 2013-11-15 – 2013-11-16 (×3): 3 mL via INTRAVENOUS

## 2013-11-15 MED ORDER — DONEPEZIL HCL 10 MG PO TABS
10.0000 mg | ORAL_TABLET | Freq: Every day | ORAL | Status: DC
  Administered 2013-11-15: 10 mg via ORAL
  Filled 2013-11-15 (×3): qty 1

## 2013-11-15 MED ORDER — HYDROCODONE-ACETAMINOPHEN 5-325 MG PO TABS: 1.0000 | ORAL_TABLET | ORAL | Status: DC | PRN

## 2013-11-15 MED ORDER — MIRTAZAPINE 7.5 MG PO TABS
7.5000 mg | ORAL_TABLET | Freq: Every day | ORAL | Status: DC
  Administered 2013-11-15: 7.5 mg via ORAL
  Filled 2013-11-15 (×3): qty 1

## 2013-11-15 MED ORDER — AMLODIPINE BESYLATE 5 MG PO TABS
5.0000 mg | ORAL_TABLET | Freq: Every day | ORAL | Status: DC
  Administered 2013-11-15 – 2013-11-16 (×2): 5 mg via ORAL
  Filled 2013-11-15 (×2): qty 1

## 2013-11-15 MED ORDER — HYDRALAZINE HCL 25 MG PO TABS
75.0000 mg | ORAL_TABLET | Freq: Three times a day (TID) | ORAL | Status: DC
  Administered 2013-11-15 – 2013-11-16 (×4): 75 mg via ORAL
  Filled 2013-11-15 (×6): qty 1

## 2013-11-15 MED ORDER — CITALOPRAM HYDROBROMIDE 20 MG PO TABS
20.0000 mg | ORAL_TABLET | Freq: Every day | ORAL | Status: DC
  Administered 2013-11-15 – 2013-11-16 (×2): 20 mg via ORAL
  Filled 2013-11-15 (×2): qty 1

## 2013-11-15 MED ORDER — MEMANTINE HCL 10 MG PO TABS
10.0000 mg | ORAL_TABLET | Freq: Two times a day (BID) | ORAL | Status: DC
  Administered 2013-11-15 – 2013-11-16 (×3): 10 mg via ORAL
  Filled 2013-11-15 (×4): qty 1

## 2013-11-15 MED ORDER — ALPRAZOLAM 0.25 MG PO TABS: 0.2500 mg | ORAL_TABLET | Freq: Two times a day (BID) | ORAL | Status: DC | PRN

## 2013-11-15 MED ORDER — MECLIZINE HCL 25 MG PO TABS
32.0000 mg | ORAL_TABLET | Freq: Three times a day (TID) | ORAL | Status: DC | PRN
  Filled 2013-11-15: qty 0.5

## 2013-11-15 MED ORDER — ACETAMINOPHEN 325 MG PO TABS: 650.0000 mg | ORAL_TABLET | Freq: Four times a day (QID) | ORAL | Status: DC | PRN

## 2013-11-15 MED ORDER — SIMVASTATIN 40 MG PO TABS
40.0000 mg | ORAL_TABLET | Freq: Every day | ORAL | Status: DC
  Filled 2013-11-15 (×2): qty 1

## 2013-11-15 MED ORDER — ONDANSETRON HCL 4 MG/2ML IJ SOLN: 4.0000 mg | Freq: Four times a day (QID) | INTRAMUSCULAR | Status: DC | PRN

## 2013-11-15 MED ORDER — ASPIRIN EC 81 MG PO TBEC
81.0000 mg | DELAYED_RELEASE_TABLET | Freq: Every day | ORAL | Status: DC
  Administered 2013-11-15 – 2013-11-16 (×2): 81 mg via ORAL
  Filled 2013-11-15 (×2): qty 1

## 2013-11-15 MED ORDER — ONDANSETRON HCL 4 MG PO TABS: 4.0000 mg | ORAL_TABLET | Freq: Four times a day (QID) | ORAL | Status: DC | PRN

## 2013-11-15 MED ORDER — SODIUM CHLORIDE 0.9 % IV SOLN
INTRAVENOUS | Status: DC
  Administered 2013-11-15: 75 mL/h via INTRAVENOUS

## 2013-11-15 MED ORDER — ACETAMINOPHEN 650 MG RE SUPP: 650.0000 mg | Freq: Four times a day (QID) | RECTAL | Status: DC | PRN

## 2013-11-15 MED ORDER — ATORVASTATIN CALCIUM 20 MG PO TABS
20.0000 mg | ORAL_TABLET | Freq: Every day | ORAL | Status: DC
  Administered 2013-11-15: 20 mg via ORAL
  Filled 2013-11-15 (×2): qty 1

## 2013-11-15 NOTE — Progress Notes (Signed)
Patient admitted after midnight.  Please see H&P by Dr. Posey Pronto  Accelerated hypertension  Patient is presenting with elevated blood pressure likely secondary to and responds to pain.   mild elevation of his serum creatinine- d/c lisinopril and place him on Norvasc.   Suprapubic tenderness  UTI - recently treated Bladder wall thickening  CT scan is showing bladder wall thickening without any muscle. Patient recently had hematuria and has been treated with antibiotic for UTI.  ? urologist for an outpatient followup.   Asymptomatic bradycardia.  Heart rate less than 50   We'll continue to monitor on telemetry.  EKG  Dehydration  Left-sided nephrectomy  Mild increase in serum creatinine/BUN  Proteinuria >300 in urine- repeat  Eulogio Bear DO

## 2013-11-15 NOTE — H&P (Signed)
[facility] History and Physical  Patient: [name]  EVO:[id number]  DOB: [birth year]  DOS: the patient was seen and examined on 11/15/2013 PCP: Estill Dooms, MD  Chief Complaint: Abdominal pain  HPI: [name] is a 80 y.o. male with Past medical history of hypertension, CAD, AAA, colon cancer status post colectomy, BPH, bradycardia, CKD. Patient presents with complaints of abdominal pain. He mentions that the abdominal pain on the left side which has been has been ongoing since last one day. He mentions he has a suprapubic pain along with hematuria since last 3-4 weeks. He has completed a course of antibiotic as well as pyridium. He denies any fever or chills complains of some nausea but no vomiting complains of some acid reflux. No abdominal pain anywhere else. No chest pain no shortness of breath. He has on and off constipation. Denies any rash anywhere. Since last few days he has decreased oral intake.  The patient is coming from home. And at his baseline independent for most of his ADL.  Review of Systems: as mentioned in the history of present illness.  A Comprehensive review of the other systems is negative.  Past Medical History  Diagnosis Date  . Arthritis   . Depression   . Hypertension   . Hyperlipidemia   . Colon cancer   . AAA (abdominal aortic aneurysm) 2011  . CAD (coronary artery disease) 2003  . Anemia   . Diverticula of colon 2013  . Hypertrophy of prostate without urinary obstruction and other lower urinary tract symptoms (LUTS) 2008  . Hyperpotassemia 2008  . Gout, unspecified 2008  . Impacted cerumen 06/20/2006  . First degree atrioventricular block 2008  . Abnormality of gait 2007  . Other specified cardiac dysrhythmias(427.89) 2005  . Unspecified disorder of kidney and ureter 2005  . Edema 2005  . Malignant neoplasm of colon, unspecified site 2004  . Unspecified hereditary and idiopathic peripheral neuropathy 2004  . Chest pain,  unspecified 2003  . Pain in joint, pelvic region and thigh 2004  . Pain in limb 2003  . Myocardial infarction 1984  . Exertional shortness of breath   . Pneumonia ~ 02/2013    "hospitalized for double pneumonia" (05/15/2013)  . Anxiety   . Chronic kidney disease     "born w/only 1 kidney" (05/15/2013)  . Chronic kidney disease (CKD), stage III (moderate)     [name] 05/14/2013 (05/15/2013)  . Postural dizziness     hospitalized 05/14/2013   Past Surgical History  Procedure Laterality Date  . Mediastinotomy    . Right colectomy  2004  . Inguinal hernia repair Right 1952    In [place]  . Coronary artery bypass graft  1996    B[place]le, MD  . Cardiac catheterization    . Colon surgery    . Lesion removal N/A 08/12/2013    Procedure: EXCISION FACIAL SKIN CANCER X 3    (MINOR PROCEDURE) ;  Surgeon: Rozetta Nunnery, MD;  Location: [facility];  Service: ENT;  Laterality: N/A;   Social History:  reports that he quit smoking about 31 years ago. His smoking use included Cigarettes. He has a 35 pack-year smoking history. He has quit using smokeless tobacco. His smokeless tobacco use included Snuff. He reports that he drinks alcohol. He reports that he does not use illicit drugs.  Allergies  Allergen Reactions  . Diovan [Valsartan] Other (See Comments)    Unknown     Family History  Problem  Relation Age of Onset  . Heart disease Father   . Pancreatic cancer Sister   . Cancer Sister     liver  . Cancer Brother     Leukemia  . Cancer Mother     ?    Prior to Admission medications   Medication Sig Start Date End Date Taking? Authorizing Provider  ALPRAZolam (XANAX) 0.25 MG tablet Take 0.25 mg by mouth 2 (two) times daily as needed for anxiety.   Yes Historical Provider, MD  aspirin EC 81 MG tablet Take 81 mg by mouth daily.   Yes Historical Provider, MD  citalopram (CELEXA) 20 MG tablet Take 1 tablet (20 mg total) by mouth daily. 10/13/13  Yes Tiffany L Reed, DO  divalproex  (DEPAKOTE SPRINKLE) 125 MG capsule Take 125 mg by mouth 2 (two) times daily.   Yes Historical Provider, MD  donepezil (ARICEPT) 10 MG tablet Take 10 mg by mouth at bedtime.   Yes Historical Provider, MD  feeding supplement, ENSURE COMPLETE, (ENSURE COMPLETE) LIQD Take 237 mLs by mouth 2 (two) times daily between meals. 06/09/13  Yes Tiffany L Reed, DO  hydrALAZINE (APRESOLINE) 25 MG tablet Take 75 mg by mouth 3 (three) times daily.   Yes Historical Provider, MD  lisinopril (PRINIVIL,ZESTRIL) 20 MG tablet Take 20 mg by mouth daily.   Yes Historical Provider, MD  loratadine (CLARITIN) 10 MG tablet Take 10 mg by mouth daily.   Yes Historical Provider, MD  meclizine (ANTIVERT) 12.5 MG tablet Take 2.5 tablets (31.25 mg total) by mouth 3 (three) times daily as needed for dizziness. 05/17/13  Yes Thurnell Lose, MD  memantine (NAMENDA) 10 MG tablet Take 10 mg by mouth 2 (two) times daily.   Yes Historical Provider, MD  mirtazapine (REMERON) 15 MG tablet Take 7.5 mg by mouth at bedtime.   Yes Historical Provider, MD  nystatin cream (MYCOSTATIN) Apply 1 application topically 2 (two) times daily. 06/09/13  Yes Tiffany L Reed, DO  phenazopyridine (PYRIDIUM) 200 MG tablet Take 1 tablet (200 mg total) by mouth 3 (three) times daily as needed for pain. 10/31/13  Yes Harden Mo, MD  simvastatin (ZOCOR) 40 MG tablet Take 1 tablet (40 mg total) by mouth at bedtime. Take 1 tablet once daily to lower cholesterol. 06/09/13  Yes Tiffany L Reed, DO  ZOSTER VACCINE LIVE [place] Inject 0.65 mLs into the skin.   Yes Historical Provider, MD    Physical Exam: Filed Vitals:   11/15/13 0000 11/15/13 0045 11/15/13 0100 11/15/13 0452  BP: 182/70 135/70 162/70 159/62  Pulse: 52 45 51 46  Temp:   97.8 F (36.6 C) 97.5 F (36.4 C)  TempSrc:   Oral Oral  Resp: 15 17 18 18   Height:   6' (1.829 m)   Weight:   86.2 kg (190 lb 0.6 oz) 86.2 kg (190 lb 0.6 oz)  SpO2: 94% 97% 96% 97%    General: Alert, Awake and Oriented to  Time, Place and Person. Appear in mild distress Eyes: PERRL ENT: Oral Mucosa clear dry. Neck: No JVD Cardiovascular: S1 and S2 Present, no Murmur, Peripheral Pulses Present Respiratory: Bilateral Air entry equal and Decreased, Clear to Auscultation,  No Crackles, no wheezes Abdomen: Bowel Sound Present, Soft and exquisitely tender on the left lower quadrant and suprapubic area Skin: No Rash Extremities: No Pedal edema, no calf tenderness Neurologic: Grossly no focal neuro deficit. Labs on Admission:  CBC:  Recent Labs Lab 11/14/13 1557  WBC 8.2  NEUTROABS  6.2  HGB 10.7*  HCT 34.1*  MCV 97.7  PLT 180    CMP     Component Value Date/Time   NA 141 11/14/2013 1557   NA 142 11/20/2012 1618   K 5.0 11/14/2013 1557   CL 105 11/14/2013 1557   CO2 23 11/14/2013 1557   GLUCOSE 113* 11/14/2013 1557   GLUCOSE 150* 11/20/2012 1618   BUN 36* 11/14/2013 1557   BUN 30* 11/20/2012 1618   CREATININE 2.18* 11/14/2013 1557   CREATININE 1.90* 05/29/2011 1040   CALCIUM 9.2 11/14/2013 1557   PROT 6.9 11/14/2013 1557   ALBUMIN 3.4* 11/14/2013 1557   AST 17 11/14/2013 1557   ALT 11 11/14/2013 1557   ALKPHOS 76 11/14/2013 1557   BILITOT 0.2* 11/14/2013 1557   GFRNONAA 26* 11/14/2013 1557   GFRAA 30* 11/14/2013 1557     Recent Labs Lab 11/14/13 1557  LIPASE 85*   No results found for this basename: AMMONIA,  in the last 168 hours  No results found for this basename: CKTOTAL, CKMB, CKMBINDEX, TROPONINI,  in the last 168 hours BNP (last 3 results)  Recent Labs  03/05/13 0230  PROBNP 675.0*    Radiological Exams on Admission: Ct Abdomen Pelvis Wo Contrast  11/14/2013   CLINICAL DATA:  Left upper quadrant abdominal pain.  EXAM: CT ABDOMEN AND PELVIS WITHOUT CONTRAST  TECHNIQUE: Multidetector CT imaging of the abdomen and pelvis was performed following the standard protocol without IV contrast.  COMPARISON:  05/14/2013.  FINDINGS: The lung bases are clear of acute process. Lingular and right middle lobe scarring  changes are noted. The heart is upper limits of normal in size. No pericardial effusion dense 3 vessel coronary artery calcifications are noted. The distal esophagus is grossly normal. There is tortuosity and calcification of the thoracic aorta.  The unenhanced appearance of the liver is unremarkable. No focal lesion or intrahepatic biliary dilatation. The gallbladder is normal. No common bile duct dilatation. The pancreas is grossly normal. The spleen is normal in size. No focal lesions. The adrenal glands are normal. The left kidney is surgically absent. The right kidney is within normal limits and stable. No renal or obstructing ureteral calculi.  Stable 5 cm infrarenal abdominal aortic aneurysm and advanced atherosclerotic disease involving the iliac arteries.  The stomach, duodenum, small bowel and colon are unremarkable. No inflammatory changes, mass lesions or obstructive findings. There is advanced diverticulosis involving the descending and sigmoid colon along with muscular wall thickening but no findings for acute diverticulitis. No mesenteric or retroperitoneal mass or adenopathy.  The bladder demonstrates poor distention but there is fairly marked symmetric bladder wall thickening. A small UA cul remnant is noted the prostate gland and seminal vesicles are unremarkable. No pelvic mass, adenopathy or free pelvic fluid collections. No inguinal mass or adenopathy.  The bony structures are intact.  IMPRESSION: No acute abdominal/ pelvic findings, mass lesions or adenopathy.  Status post left nephrectomy.  Thick walled bladder but no mass or calculi.  Colonic diverticulosis without findings for acute diverticulitis.  Stable 5 cm infrarenal abdominal aortic aneurysm.   Electronically Signed   By: Kalman Jewels M.D.   On: 11/14/2013 21:58     Assessment/Plan Principal Problem:   Accelerated hypertension Active Problems:   S/P right colectomy   Bladder wall thickening   Hematuria   UTI (lower  urinary tract infection)   Suprapubic tenderness   CKD (chronic kidney disease)   1. Accelerated hypertension Patient is presenting with elevated blood  pressure likely secondary to and responds to pain. At present he also has mild elevation of his serum creatinine. With that he is on lisinopril which I would stop and place him on Norvasc. Patient will be monitored in the hospital with resumption of his home medications.  2. Suprapubic tenderness UTI Bladder wall thickening CT scan is showing bladder wall thickening without any muscle. Patient recently had hematuria and has been treated with antibiotic for UTI. At present we'll monitor. May discussed with urologist for an outpatient followup.  3. Symptomatic bradycardia. Heart rate less than 50 his persistent. We'll continue to monitor on telemetry.  4. Dehydration Left-sided nephrectomy Mild increase in serum creatinine/BUN  IV hydration, repeat check in morning  DVT Prophylaxis: subcutaneous Heparin Nutrition: Regular diet  Code Status: Full  Family Communication: Caregiver was present at bedside, opportunity was given to ask question and all questions were answered satisfactorily at the time of interview. Disposition: Admitted to observation in telemetry unit.  Author: Berle Mull, MD [facility] Pager: [phone] 11/15/2013, 5:19 AM    If 7PM-7AM, please contact night-coverage [email or web address] Password [email or web address]

## 2013-11-16 DIAGNOSIS — N3289 Other specified disorders of bladder: Secondary | ICD-10-CM

## 2013-11-16 DIAGNOSIS — I10 Essential (primary) hypertension: Secondary | ICD-10-CM

## 2013-11-16 DIAGNOSIS — N189 Chronic kidney disease, unspecified: Secondary | ICD-10-CM

## 2013-11-16 LAB — BASIC METABOLIC PANEL
BUN: 39 mg/dL — ABNORMAL HIGH (ref 6–23)
CO2: 21 mEq/L (ref 19–32)
Calcium: 8.8 mg/dL (ref 8.4–10.5)
Chloride: 109 mEq/L (ref 96–112)
Creatinine, Ser: 2.16 mg/dL — ABNORMAL HIGH (ref 0.50–1.35)
GFR, EST AFRICAN AMERICAN: 31 mL/min — AB (ref >90)
GFR, EST NON AFRICAN AMERICAN: 27 mL/min — AB (ref >90)
Glucose, Bld: 107 mg/dL — ABNORMAL HIGH (ref 70–99)
POTASSIUM: 5.7 meq/L — AB (ref 3.7–5.3)
Sodium: 141 mEq/L (ref 137–147)

## 2013-11-16 LAB — CBC
HCT: 31 % — ABNORMAL LOW (ref 39.0–52.0)
Hemoglobin: 9.9 g/dL — ABNORMAL LOW (ref 13.0–17.0)
MCH: 30.7 pg (ref 26.0–34.0)
MCHC: 31.9 g/dL (ref 30.0–36.0)
MCV: 96.3 fL (ref 78.0–100.0)
Platelets: 166 10*3/uL (ref 150–400)
RBC: 3.22 MIL/uL — ABNORMAL LOW (ref 4.22–5.81)
RDW: 13.8 % (ref 11.5–15.5)
WBC: 4.3 10*3/uL (ref 4.0–10.5)

## 2013-11-16 MED ORDER — AMLODIPINE BESYLATE 5 MG PO TABS: 5.0000 mg | ORAL_TABLET | Freq: Every day | ORAL | Status: DC

## 2013-11-16 MED ORDER — SODIUM POLYSTYRENE SULFONATE 15 GM/60ML PO SUSP
30.0000 g | Freq: Once | ORAL | Status: AC
  Administered 2013-11-16: 30 g via ORAL
  Filled 2013-11-16: qty 120

## 2013-11-16 MED ORDER — ATORVASTATIN CALCIUM 20 MG PO TABS: 20.0000 mg | ORAL_TABLET | Freq: Every day | ORAL | Status: DC

## 2013-11-16 NOTE — Discharge Summary (Signed)
Physician Discharge Summary  [name] UKG:[id number] DOB: 1927/08/12 DOA: 11/14/2013  PCP: Estill Dooms, MD  Admit date: 11/14/2013 Discharge date: 11/16/2013  Time spent: 86minutes  Recommendations for Outpatient Follow-up:  1. Outpatient referral to urology 2. Repeat U/A 1 week 3. BMp 1 week re Cr  Discharge Diagnoses:  Principal Problem:   Accelerated hypertension Active Problems:   S/P right colectomy   Bladder wall thickening   Hematuria   UTI (lower urinary tract infection)   Suprapubic tenderness   CKD (chronic kidney disease)   Discharge Condition: improved  Diet recommendation: as tolerated  Filed Weights   11/15/13 0100 11/15/13 0452 11/16/13 0550  Weight: 86.2 kg (190 lb 0.6 oz) 86.2 kg (190 lb 0.6 oz) 86.4 kg (190 lb 7.6 oz)    History of present illness:  [name] is a 80 y.o. male with Past medical history of hypertension, CAD, AAA, colon cancer status post colectomy, BPH, bradycardia, CKD.  Patient presents with complaints of abdominal pain. He mentions that the abdominal pain on the left side which has been has been ongoing since last one day. He mentions he has a suprapubic pain along with hematuria since last 3-4 weeks. He has completed a course of antibiotic as well as pyridium.  He denies any fever or chills complains of some nausea but no vomiting complains of some acid reflux. No abdominal pain anywhere else. No chest pain no shortness of breath. He has on and off constipation. Denies any rash anywhere.  Since last few days he has decreased oral intake.  The patient is coming from home. And at his baseline independent for most of his ADL.   Hospital Course:  Accelerated hypertension  resolved  mild elevation of his serum creatinine- d/c lisinopril and place him on Norvasc. -- change statins as well due to increased risk of rhabdo  Suprapubic tenderness  UTI - recently treated  Bladder wall thickening  CT scan is showing bladder wall  thickening  Patient recently had hematuria and has been treated with antibiotic for UTI.  Urologist referral  for an outpatient followup.   Asymptomatic bradycardia.  -on namenda  Dehydration  Gentle IVF  Left-sided nephrectomy   Proteinuria  >300 in urine- repeat outpatient  Hyperkalemia -? From SQ heparin- d/c and recheck outpatient Given kayexelate -encourage daily BMs -lisinopril d/c'd  Procedures:  none  Consultations:  none  Discharge Exam: Filed Vitals:   11/16/13 0552  BP: 138/60  Pulse: 56  Temp:   Resp:     General: pleasant/cooperative, care giver at bedside- anxious to go home- feels "fine" Cardiovascular: rrr Respiratory: clear anterior  Discharge Instructions You were cared for by a hospitalist during your hospital stay. If you have any questions about your discharge medications or the care you received while you were in the hospital after you are discharged, you can call the unit and asked to speak with the hospitalist on call if the hospitalist that took care of you is not available. Once you are discharged, your primary care physician will handle any further medical issues. Please note that NO REFILLS for any discharge medications will be authorized once you are discharged, as it is imperative that you return to your primary care physician (or establish a relationship with a primary care physician if you do not have one) for your aftercare needs so that they can reassess your need for medications and monitor your lab values.      Discharge Instructions  Diet - low sodium heart healthy    Complete by:  As directed      Discharge instructions    Complete by:  As directed   Bmp 1 week U/A 1 week     Increase activity slowly    Complete by:  As directed             Medication List    STOP taking these medications       lisinopril 20 MG tablet  Commonly known as:  PRINIVIL,ZESTRIL     phenazopyridine 200 MG tablet  Commonly known as:   PYRIDIUM     simvastatin 40 MG tablet  Commonly known as:  ZOCOR      TAKE these medications       ALPRAZolam 0.25 MG tablet  Commonly known as:  XANAX  Take 0.25 mg by mouth 2 (two) times daily as needed for anxiety.     amLODipine 5 MG tablet  Commonly known as:  NORVASC  Take 1 tablet (5 mg total) by mouth daily.     aspirin EC 81 MG tablet  Take 81 mg by mouth daily.     atorvastatin 20 MG tablet  Commonly known as:  LIPITOR  Take 1 tablet (20 mg total) by mouth at bedtime.     citalopram 20 MG tablet  Commonly known as:  CELEXA  Take 1 tablet (20 mg total) by mouth daily.     divalproex 125 MG capsule  Commonly known as:  DEPAKOTE SPRINKLE  Take 125 mg by mouth 2 (two) times daily.     donepezil 10 MG tablet  Commonly known as:  ARICEPT  Take 10 mg by mouth at bedtime.     feeding supplement (ENSURE COMPLETE) Liqd  Take 237 mLs by mouth 2 (two) times daily between meals.     hydrALAZINE 25 MG tablet  Commonly known as:  APRESOLINE  Take 75 mg by mouth 3 (three) times daily.     loratadine 10 MG tablet  Commonly known as:  CLARITIN  Take 10 mg by mouth daily.     meclizine 12.5 MG tablet  Commonly known as:  ANTIVERT  Take 2.5 tablets (31.25 mg total) by mouth 3 (three) times daily as needed for dizziness.     memantine 10 MG tablet  Commonly known as:  NAMENDA  Take 10 mg by mouth 2 (two) times daily.     mirtazapine 15 MG tablet  Commonly known as:  REMERON  Take 7.5 mg by mouth at bedtime.     nystatin cream  Commonly known as:  MYCOSTATIN  Apply 1 application topically 2 (two) times daily.     ZOSTER VACCINE LIVE [place]  Inject 0.65 mLs into the skin.       Allergies  Allergen Reactions  . Diovan [Valsartan] Other (See Comments)    Unknown       The results of significant diagnostics from this hospitalization (including imaging, microbiology, ancillary and laboratory) are listed below for reference.    Significant Diagnostic  Studies: Ct Abdomen Pelvis Wo Contrast  11/14/2013   CLINICAL DATA:  Left upper quadrant abdominal pain.  EXAM: CT ABDOMEN AND PELVIS WITHOUT CONTRAST  TECHNIQUE: Multidetector CT imaging of the abdomen and pelvis was performed following the standard protocol without IV contrast.  COMPARISON:  05/14/2013.  FINDINGS: The lung bases are clear of acute process. Lingular and right middle lobe scarring changes are noted. The heart is upper limits of normal in size.  No pericardial effusion dense 3 vessel coronary artery calcifications are noted. The distal esophagus is grossly normal. There is tortuosity and calcification of the thoracic aorta.  The unenhanced appearance of the liver is unremarkable. No focal lesion or intrahepatic biliary dilatation. The gallbladder is normal. No common bile duct dilatation. The pancreas is grossly normal. The spleen is normal in size. No focal lesions. The adrenal glands are normal. The left kidney is surgically absent. The right kidney is within normal limits and stable. No renal or obstructing ureteral calculi.  Stable 5 cm infrarenal abdominal aortic aneurysm and advanced atherosclerotic disease involving the iliac arteries.  The stomach, duodenum, small bowel and colon are unremarkable. No inflammatory changes, mass lesions or obstructive findings. There is advanced diverticulosis involving the descending and sigmoid colon along with muscular wall thickening but no findings for acute diverticulitis. No mesenteric or retroperitoneal mass or adenopathy.  The bladder demonstrates poor distention but there is fairly marked symmetric bladder wall thickening. A small UA cul remnant is noted the prostate gland and seminal vesicles are unremarkable. No pelvic mass, adenopathy or free pelvic fluid collections. No inguinal mass or adenopathy.  The bony structures are intact.  IMPRESSION: No acute abdominal/ pelvic findings, mass lesions or adenopathy.  Status post left nephrectomy.  Thick  walled bladder but no mass or calculi.  Colonic diverticulosis without findings for acute diverticulitis.  Stable 5 cm infrarenal abdominal aortic aneurysm.   Electronically Signed   By: Kalman Jewels M.D.   On: 11/14/2013 21:58    Microbiology: Recent Results (from the past 240 hour(s))  MRSA PCR SCREENING     Status: None   Collection Time    11/15/13  1:15 AM      Result Value Ref Range Status   MRSA by PCR NEGATIVE  NEGATIVE Final   Comment:            The GeneXpert MRSA Assay (FDA     approved for NASAL specimens     only), is one component of a     comprehensive MRSA colonization     surveillance program. It is not     intended to diagnose MRSA     infection nor to guide or     monitor treatment for     MRSA infections.     Labs: Basic Metabolic Panel:  Recent Labs Lab 11/14/13 1557 11/15/13 0445 11/16/13 0320  NA 141 145 141  K 5.0 5.5* 5.7*  CL 105 110 109  CO2 23 24 21   GLUCOSE 113* 82 107*  BUN 36* 35* 39*  CREATININE 2.18* 2.10* 2.16*  CALCIUM 9.2 8.8 8.8   Liver Function Tests:  Recent Labs Lab 11/14/13 1557 11/15/13 0445  AST 17 16  ALT 11 10  ALKPHOS 76 67  BILITOT 0.2* 0.2*  PROT 6.9 6.3  ALBUMIN 3.4* 3.1*    Recent Labs Lab 11/14/13 1557  LIPASE 85*   No results found for this basename: AMMONIA,  in the last 168 hours CBC:  Recent Labs Lab 11/14/13 1557 11/15/13 0445 11/16/13 0320  WBC 8.2 5.3 4.3  NEUTROABS 6.2  --   --   HGB 10.7* 9.9* 9.9*  HCT 34.1* 31.6* 31.0*  MCV 97.7 97.5 96.3  PLT 180 159 166   Cardiac Enzymes: No results found for this basename: CKTOTAL, CKMB, CKMBINDEX, TROPONINI,  in the last 168 hours BNP: BNP (last 3 results)  Recent Labs  03/05/13 0230  PROBNP 675.0*   CBG: No results  found for this basename: GLUCAP,  in the last 168 hours     Signed:  Geradine Girt  [facility] 11/16/2013, 11:36 AM

## 2013-11-16 NOTE — Progress Notes (Signed)
Pt D/C'd home.  Alert and oriented x4.  Unsteady on feet.  Caregiver present for discharge instructions.  IV D/C'd.  Tele D/C'd.  Education given on diet, activity,meds, and follow-up care and appointments.  Caregiver and pt verbalized understanding.  Pt D/C'd home via private vehicle.

## 2013-11-20 NOTE — ED Provider Notes (Signed)
Medical screening examination/treatment/procedure(s) were performed by non-physician practitioner and as supervising physician I was immediately available for consultation/collaboration.   EKG Interpretation None       Varney Biles, MD 11/20/13 0151

## 2013-11-22 ENCOUNTER — Other Ambulatory Visit: Payer: Self-pay | Admitting: Internal Medicine

## 2013-11-23 ENCOUNTER — Other Ambulatory Visit: Payer: Self-pay | Admitting: Internal Medicine

## 2013-11-24 ENCOUNTER — Ambulatory Visit (INDEPENDENT_AMBULATORY_CARE_PROVIDER_SITE_OTHER)
Admission: RE | Admit: 2013-11-24 | Discharge: 2013-11-24 | Disposition: A | Discharge status: Home / Self Care | Payer: Medicare Other | Source: Ambulatory Visit | Attending: Emergency Medicine | Admitting: Emergency Medicine

## 2013-11-24 ENCOUNTER — Telehealth: Payer: Self-pay | Admitting: Critical Care Medicine

## 2013-11-24 DIAGNOSIS — R918 Other nonspecific abnormal finding of lung field: Secondary | ICD-10-CM

## 2013-11-24 NOTE — Telephone Encounter (Signed)
Call from Radiology.  Enlarging nodule on CT scan  See report

## 2013-11-25 NOTE — Telephone Encounter (Signed)
Pt scheduled for 11/26/13 @ 11:45

## 2013-11-25 NOTE — Telephone Encounter (Signed)
Please make sure we have an ROV to talk about his CT scan

## 2013-11-25 NOTE — Telephone Encounter (Signed)
LMTCB on [phone] which is # for pt's caregiver. Attempting to schedule an appointment to discuss CT results 11/24/13

## 2013-11-26 ENCOUNTER — Encounter: Payer: Self-pay | Admitting: Emergency Medicine

## 2013-11-26 ENCOUNTER — Ambulatory Visit (INDEPENDENT_AMBULATORY_CARE_PROVIDER_SITE_OTHER): Payer: Medicare Other | Admitting: Emergency Medicine

## 2013-11-26 VITALS — BP 140/70 | HR 68 | Ht 72.0 in | Wt 182.0 lb

## 2013-11-26 DIAGNOSIS — R918 Other nonspecific abnormal finding of lung field: Secondary | ICD-10-CM

## 2013-11-26 DIAGNOSIS — R0989 Other specified symptoms and signs involving the circulatory and respiratory systems: Secondary | ICD-10-CM

## 2013-11-26 DIAGNOSIS — R0609 Other forms of dyspnea: Secondary | ICD-10-CM

## 2013-11-26 DIAGNOSIS — R0600 Dyspnea, unspecified: Secondary | ICD-10-CM

## 2013-11-26 DIAGNOSIS — J449 Chronic obstructive pulmonary disease, unspecified: Secondary | ICD-10-CM | POA: Insufficient documentation

## 2013-11-26 NOTE — Patient Instructions (Signed)
Start taking spiriva once a day until next visit Dr Lamonte Sakai will review your CT scan with oncology to discuss the options for therapy for your enlarging pulmonary nodule Follow with Dr Lamonte Sakai in 1 month

## 2013-11-26 NOTE — Assessment & Plan Note (Signed)
Trial spiriva respirmat  rov 1 month

## 2013-11-26 NOTE — Progress Notes (Signed)
Subjective:    Patient ID: [name], male    DOB: 06-05-1928, 80 y.o.   MRN: [id number]  HPI 80 yo man, former smoker, hx of dementia, CAD, AAA, HTN, recent admission for symptomatic bradycardia. Part of his evaluation included a CT scan of the chest performed on 05/16/13 that identified to slowly enlarging left upper lobe groundglass nodules. Also seen was a more solid nodule in the left lower lobe which was slightly enlarged compared with his study on 03/06/13. Bilateral lower lobe infiltrates that were seen on his scan from 9/25 were resolved on the new study. He is referred for evaluation of his pulmonary nodules.    ROV 11/26/13 -- follows up to review repeat CT scan of the chest that had shown a slowly enlarging left upper lobe nodule. He is having more dyspnea over the last three months. The nodule has tripled in size since    Review of Systems  Constitutional: Negative for fever and unexpected weight change.  HENT: Positive for postnasal drip. Negative for congestion, dental problem, ear pain, nosebleeds, rhinorrhea, sinus pressure, sneezing, sore throat and trouble swallowing.   Eyes: Positive for redness. Negative for itching.  Respiratory: Positive for cough. Negative for chest tightness, shortness of breath and wheezing.   Cardiovascular: Negative for palpitations and leg swelling.  Gastrointestinal: Negative for nausea and vomiting.  Genitourinary: Negative for dysuria.  Musculoskeletal: Negative for joint swelling.  Skin: Negative for rash.  Neurological: Negative for headaches.  Hematological: Bruises/bleeds easily.  Psychiatric/Behavioral: Positive for dysphoric mood. The patient is nervous/anxious.        Objective:   Physical Exam Filed Vitals:   11/26/13 1200  BP: 140/70  Pulse: 68  Height: 6' (1.829 m)  Weight: 182 lb (82.555 kg)  SpO2: 96%   Gen: Quiet, somewhat frail man, in no distress,  normal affect  ENT: No lesions,  mouth clear,  oropharynx  clear, no postnasal drip  Neck: No JVD, no TMG, no carotid bruits  Lungs: No use of accessory muscles, clear without rales or rhonchi  Cardiovascular: RRR, heart sounds normal, no murmur or gallops, no peripheral edema  Musculoskeletal: No deformities, no cyanosis or clubbing  Neuro: alert, non focal  Skin: Warm, no lesions or rashes  11/24/13 --  COMPARISON: Chest CT 05/16/2013.  FINDINGS:  Mediastinum: Heart size is normal. There is no significant  pericardial fluid, thickening or pericardial calcification. There is  atherosclerosis of the thoracic aorta, the great vessels of the  mediastinum and the coronary arteries, including calcified  atherosclerotic plaque in the left main, left anterior descending,  left circumflex and right coronary arteries. Status post median  sternotomy for CABG, including LIMA to the LAD. No pathologically  enlarged mediastinal or hilar lymph nodes. Please note that accurate  exclusion of hilar adenopathy is limited on noncontrast CT scans.  Esophagus is unremarkable in appearance.  Lungs/Pleura: Significant interval enlargement of what is now a 2.1  x 2.5 cm macrolobulated pulmonary nodule in the superior segment of  the left lower lobe (image 30 of series 3), which previously  measured only 1.1 cm. This nodule is cephalad and immediately  adjacent to a subsegmental bronchus off the bronchus to the superior  segment of the left lower lobe. Other previously noted left lower  lobe nodule appears slightly smaller, subpleural in location  adjacent to the the left major fissure (image 41 of series 3)  measuring 7 mm on today's examination. Two ground-glass attenuation  nodules are again  noted in the left upper lobe measuring 2.0 x 1.4  cm (image 19 of series 3), and 1.8 x 1.4 cm (image 23 of series 3)  with a small 3 mm central solid component (image 24 of series 2),  similar to the prior examination. Other smaller more ill-defined  areas of  ground-glass attenuation are also noted scattered  throughout the right lung, similar to the prior examination. Areas  of nodular pleuroparenchymal thickening are also noted in the apices  of the lungs bilaterally, presumably chronic post infectious or  inflammatory scarring. Small area of scarring in the inferior  segment of the lingula is unchanged. No acute consolidative airspace  disease. No pleural effusions.  Upper Abdomen: Unremarkable.  Musculoskeletal: Multiple old healed left-sided rib fractures. There  are no aggressive appearing lytic or blastic lesions noted in the  visualized portions of the skeleton. Median sternotomy wires.  IMPRESSION:  1. Marked interval enlargement of a 2.1 x 2.5 cm macrolobulated  nodule in the superior segment of the left lower lobe, highly  suspicious for a primary bronchogenic neoplasm. Correlation with  PET-CT or biopsy is suggested for further diagnostic and staging  purposes. No definite mediastinal or hilar lymphadenopathy is  appreciated at this time on today's non contrast CT examination.  2. 2 sub solid nodules in the left upper lobe appear similar to the  prior examination, as detailed above. Continued attention on future  followup studies is recommended.  3. Atherosclerosis, including left main and 3 vessel coronary artery  disease. Status post median sternotomy for CABG, including LIMA to  the LAD.       Assessment & Plan:  Pulmonary nodules Clearly enlarging lobulated nodule. He has hx tobacco and also colon CA. He will be tough to get a tissue dx, high risk for bx. He does not want chemo under any circumstances. I will discuss his case with thoracic group and see what our options are - ? XRT without a bx ??   Dyspnea Trial spiriva respirmat  rov 1 month

## 2013-11-26 NOTE — Assessment & Plan Note (Signed)
Clearly enlarging lobulated nodule. He has hx tobacco and also colon CA. He will be tough to get a tissue dx, high risk for bx. He does not want chemo under any circumstances. I will discuss his case with thoracic group and see what our options are - ? XRT without a bx ??

## 2013-11-28 ENCOUNTER — Encounter: Payer: Self-pay | Admitting: Surgery

## 2013-12-01 ENCOUNTER — Encounter: Payer: Self-pay | Admitting: Surgery

## 2013-12-01 ENCOUNTER — Ambulatory Visit (INDEPENDENT_AMBULATORY_CARE_PROVIDER_SITE_OTHER): Payer: Medicare Other | Admitting: Surgery

## 2013-12-01 ENCOUNTER — Ambulatory Visit
Admission: RE | Admit: 2013-12-01 | Discharge: 2013-12-01 | Disposition: A | Discharge status: Home / Self Care | Payer: Medicare Other | Source: Ambulatory Visit | Attending: Surgery | Admitting: Surgery

## 2013-12-01 VITALS — BP 158/50 | HR 60 | Ht 72.0 in | Wt 193.7 lb

## 2013-12-01 DIAGNOSIS — I714 Abdominal aortic aneurysm, without rupture, unspecified: Secondary | ICD-10-CM

## 2013-12-01 NOTE — Addendum Note (Signed)
Addended by: Mena Goes on: 12/01/2013 11:40 AM   Modules accepted: Orders

## 2013-12-01 NOTE — Progress Notes (Signed)
Patient name: [name] MRN: [id number] DOB: 1928-04-11 Sex: male     Chief Complaint  Patient presents with  . Re-evaluation    6 month f/u AAA    HISTORY OF PRESENT ILLNESS: The patient is back today for followup of his abdominal aortic aneurysm.  I have contemplated surgical correction the past, however he has not been healthy enough from a medical perspective.  He is back today for followup.  He has a history of nephrectomy and has issues with renal insufficiency.  His imaging studies have been through noncontrasted scans.  He has recently been hospitalized with a bladder infection.  He complains of pain in his right groin.  Also feels that he is not himself and is  week.  He was recently hospitalized overnight for a urine infection.  Past Medical History  Diagnosis Date  . Arthritis   . Depression   . Hypertension   . Hyperlipidemia   . Colon cancer   . AAA (abdominal aortic aneurysm) 2011  . CAD (coronary artery disease) 2003  . Anemia   . Diverticula of colon 2013  . Hypertrophy of prostate without urinary obstruction and other lower urinary tract symptoms (LUTS) 2008  . Hyperpotassemia 2008  . Gout, unspecified 2008  . Impacted cerumen 06/20/2006  . First degree atrioventricular block 2008  . Abnormality of gait 2007  . Other specified cardiac dysrhythmias(427.89) 2005  . Unspecified disorder of kidney and ureter 2005  . Edema 2005  . Malignant neoplasm of colon, unspecified site 2004  . Unspecified hereditary and idiopathic peripheral neuropathy 2004  . Chest pain, unspecified 2003  . Pain in joint, pelvic region and thigh 2004  . Pain in limb 2003  . Myocardial infarction 1984  . Exertional shortness of breath   . Pneumonia ~ 02/2013    "hospitalized for double pneumonia" (05/15/2013)  . Anxiety   . Chronic kidney disease     "born w/only 1 kidney" (05/15/2013)  . Chronic kidney disease (CKD), stage III (moderate)     [name] 05/14/2013 (05/15/2013)  .  Postural dizziness     hospitalized 05/14/2013    Past Surgical History  Procedure Laterality Date  . Mediastinotomy    . Right colectomy  2004  . Inguinal hernia repair Right 1952    In [place]  . Coronary artery bypass graft  1996    Bartle, [place]  . Cardiac catheterization    . Colon surgery    . Lesion removal N/A 08/12/2013    Procedure: EXCISION FACIAL SKIN CANCER X 3    (MINOR PROCEDURE) ;  Surgeon: Rozetta Nunnery, MD;  Location: [facility];  Service: ENT;  Laterality: N/A;    History   Social History  . Marital Status: Single    Spouse Name: N/A    Number of Children: 0  . Years of Education: N/A   Occupational History  .     Social History Main Topics  . Smoking status: Former Smoker -- 1.00 packs/day for 35 years    Types: Cigarettes    Quit date: 06/12/1982  . Smokeless tobacco: Former Systems developer    Types: Snuff  . Alcohol Use: Yes     Comment: 05/15/2013 "have a beer sometimes; not very often"  . Drug Use: No  . Sexual Activity: No   Other Topics Concern  . Not on file   Social History Narrative  . No narrative on file    Family History  Problem Relation Age of Onset  . Heart disease Father   . Pancreatic cancer Sister   . Cancer Sister     liver  . Cancer Brother     Leukemia  . Cancer Mother     ?    Allergies as of 12/01/2013 - Review Complete 12/01/2013  Allergen Reaction Noted  . Diovan [valsartan] Other (See Comments) 11/19/2012    Current Outpatient Prescriptions on File Prior to Visit  Medication Sig Dispense Refill  . ALPRAZolam (XANAX) 0.25 MG tablet TAKE 1 TABLET TWICE A DAY AS NEEDED  60 tablet  1  . amLODipine (NORVASC) 5 MG tablet Take 1 tablet (5 mg total) by mouth daily.  30 tablet  0  . aspirin EC 81 MG tablet Take 81 mg by mouth daily.      Marland Kitchen atorvastatin (LIPITOR) 20 MG tablet Take 1 tablet (20 mg total) by mouth at bedtime.  30 tablet  0  . citalopram (CELEXA) 20 MG tablet Take 1 tablet (20 mg total) by mouth  daily.  90 tablet  3  . [facility]S ASPIRIN LOW DOSE 81 MG EC tablet TAKE 1 TABLET EVERY DAY  30 tablet  5  . divalproex (DEPAKOTE SPRINKLE) 125 MG capsule Take 125 mg by mouth 2 (two) times daily.      Marland Kitchen donepezil (ARICEPT) 10 MG tablet Take 10 mg by mouth at bedtime.      . feeding supplement, ENSURE COMPLETE, (ENSURE COMPLETE) LIQD Take 237 mLs by mouth 2 (two) times daily between meals.  30 Bottle  5  . hydrALAZINE (APRESOLINE) 25 MG tablet Take 75 mg by mouth 3 (three) times daily.      Marland Kitchen loratadine (CLARITIN) 10 MG tablet Take 10 mg by mouth daily.      . meclizine (ANTIVERT) 12.5 MG tablet Take 2.5 tablets (31.25 mg total) by mouth 3 (three) times daily as needed for dizziness.  30 tablet  0  . memantine (NAMENDA) 10 MG tablet Take 10 mg by mouth 2 (two) times daily.      . mirtazapine (REMERON) 15 MG tablet Take 7.5 mg by mouth at bedtime.      Marland Kitchen nystatin cream (MYCOSTATIN) Apply 1 application topically 2 (two) times daily.  30 g  3  . ZOSTER VACCINE LIVE [place] Inject 0.65 mLs into the skin.       No current facility-administered medications on file prior to visit.     REVIEW OF SYSTEMS: Please see history of present illness.  Also positive for shortness of breath with exertion and productive cough.  PHYSICAL EXAMINATION:   Vital signs are BP 158/50  Pulse 60  Ht 6' (1.829 m)  Wt 193 lb 11.2 oz (87.862 kg)  BMI 26.26 kg/m2  SpO2 98% General: The patient appears their stated age. HEENT:  No gross abnormalities Pulmonary:  Non labored breathing Abdomen: Soft and non-tender.  Aorta is easily palpable and not tender Musculoskeletal: There are no major deformities. Neurologic: No focal weakness or paresthesias are detected, Skin: There are no ulcer or rashes noted. Psychiatric: The patient has normal affect. Cardiovascular: There is a regular rate and rhythm without significant murmur appreciated.   Diagnostic Studies I have reviewed his CT scan which shows a stable 5.2 infrarenal  abdominal aortic aneurysm with heavy calcification in the femoral arteries  Assessment: Abdominal aortic aneurysm Plan: The patient's aneurysm remained stable in size I CT scan measurements.  I do not think the patient is healthy enough to proceed  with repair at this time.  He has multiple medical issues which have not been resolved completely.  I discussed this with the patient.  I recommend having him come back for a repeat imaging study in 6 months.  I think that he is a candidate for endovascular repair, however I'm not sure if this can be done percutaneously given the amount of calcium within his groins.  He only  has one kidney as well.  Eldridge Abrahams, M.D. [facility] Office: [phone] Pager:  [phone]

## 2013-12-04 ENCOUNTER — Encounter: Payer: Self-pay | Admitting: Nurse Practitioner

## 2013-12-04 ENCOUNTER — Ambulatory Visit (INDEPENDENT_AMBULATORY_CARE_PROVIDER_SITE_OTHER): Payer: Medicare Other | Admitting: Nurse Practitioner

## 2013-12-04 VITALS — BP 164/74 | HR 68 | Temp 98.0°F | Resp 20 | Ht 72.0 in | Wt 192.4 lb

## 2013-12-04 DIAGNOSIS — R918 Other nonspecific abnormal finding of lung field: Secondary | ICD-10-CM

## 2013-12-04 DIAGNOSIS — M6281 Muscle weakness (generalized): Secondary | ICD-10-CM

## 2013-12-04 DIAGNOSIS — E1149 Type 2 diabetes mellitus with other diabetic neurological complication: Secondary | ICD-10-CM

## 2013-12-04 DIAGNOSIS — Z9181 History of falling: Secondary | ICD-10-CM

## 2013-12-04 DIAGNOSIS — I10 Essential (primary) hypertension: Secondary | ICD-10-CM

## 2013-12-04 NOTE — Patient Instructions (Signed)
Keep follow up with [name]  STOP simvastatin Stop lisinopril (if he is still getting)   Will get blood work today

## 2013-12-04 NOTE — Progress Notes (Signed)
Patient ID: [name], male   DOB: 1927-12-26, 80 y.o.   MRN: [id number]    Allergies  Allergen Reactions  . Diovan [Valsartan] Other (See Comments)    Unknown     Chief Complaint  Patient presents with  . Medical Management of Chronic Issues    HPI: Patient is a 80 y.o. male seen in the office today for follow up hospitalization  Caregiver here with pt giving history Reports on-going weakness and confusion, confusion has actually gotten better Not eating well this week, caregiver pushing fluids  On review of medication pt is taking simvastatin and atorvastatin, reports his pill boxes were already made and she was instructed to add atorvastatin and did not know he was not to be getting simvastatin.  Also new is norvasc, no LE edema, at home blood pressure is around 150/60s-- did not take medication today because he is fasting for blood work, plans to see Dr Mariea Clonts next week.   would like rx for lift chair due to decreased mobility Does not use walker and has had PT in the past but the d/c'd him due to not following recommendations Had fall yesterday, no injury but has small skin tear.   Review of Systems:  Review of Systems  Constitutional: Positive for malaise/fatigue. Negative for fever and chills.  Respiratory: Positive for shortness of breath (pt reports shortness of breath, but does not appear to be in any disstress ). Negative for cough.   Cardiovascular: Positive for leg swelling (chronic). Negative for chest pain.  Gastrointestinal: Negative for heartburn, abdominal pain, diarrhea and constipation.  Genitourinary: Negative for dysuria, urgency and frequency.  Musculoskeletal: Positive for falls and myalgias.  Neurological: Positive for weakness.  Psychiatric/Behavioral: Positive for memory loss.    Past Medical History  Diagnosis Date  . Arthritis   . Depression   . Hypertension   . Hyperlipidemia   . Colon cancer   . AAA (abdominal aortic aneurysm) 2011  . CAD  (coronary artery disease) 2003  . Anemia   . Diverticula of colon 2013  . Hypertrophy of prostate without urinary obstruction and other lower urinary tract symptoms (LUTS) 2008  . Hyperpotassemia 2008  . Gout, unspecified 2008  . Impacted cerumen 06/20/2006  . First degree atrioventricular block 2008  . Abnormality of gait 2007  . Other specified cardiac dysrhythmias(427.89) 2005  . Unspecified disorder of kidney and ureter 2005  . Edema 2005  . Malignant neoplasm of colon, unspecified site 2004  . Unspecified hereditary and idiopathic peripheral neuropathy 2004  . Chest pain, unspecified 2003  . Pain in joint, pelvic region and thigh 2004  . Pain in limb 2003  . Myocardial infarction 1984  . Exertional shortness of breath   . Pneumonia ~ 02/2013    "hospitalized for double pneumonia" (05/15/2013)  . Anxiety   . Chronic kidney disease     "born w/only 1 kidney" (05/15/2013)  . Chronic kidney disease (CKD), stage III (moderate)     [name] 05/14/2013 (05/15/2013)  . Postural dizziness     hospitalized 05/14/2013   Past Surgical History  Procedure Laterality Date  . Mediastinotomy    . Right colectomy  2004  . Inguinal hernia repair Right 1952    In [place]  . Coronary artery bypass graft  1996    Bartle, [place]  . Cardiac catheterization    . Colon surgery    . Lesion removal N/A 08/12/2013    Procedure: EXCISION FACIAL SKIN CANCER X 3    (  MINOR PROCEDURE) ;  Surgeon: Rozetta Nunnery, MD;  Location: [facility];  Service: ENT;  Laterality: N/A;   Social History:   reports that he quit smoking about 31 years ago. His smoking use included Cigarettes. He has a 35 pack-year smoking history. He has quit using smokeless tobacco. His smokeless tobacco use included Snuff. He reports that he drinks alcohol. He reports that he does not use illicit drugs.  Family History  Problem Relation Age of Onset  . Heart disease Father   . Pancreatic cancer Sister   . Cancer Sister      liver  . Cancer Brother     Leukemia  . Cancer Mother     ?    Medications: Patient's Medications  New Prescriptions   No medications on file  Previous Medications   ALPRAZOLAM (XANAX) 0.25 MG TABLET    TAKE 1 TABLET TWICE A DAY AS NEEDED   AMLODIPINE (NORVASC) 5 MG TABLET    Take 1 tablet (5 mg total) by mouth daily.   ATORVASTATIN (LIPITOR) 20 MG TABLET    Take 1 tablet (20 mg total) by mouth at bedtime.   CITALOPRAM (CELEXA) 20 MG TABLET    Take 1 tablet (20 mg total) by mouth daily.   CVS ASPIRIN LOW DOSE 81 MG EC TABLET    TAKE 1 TABLET EVERY DAY   DIVALPROEX (DEPAKOTE SPRINKLE) 125 MG CAPSULE    Take 125 mg by mouth 2 (two) times daily.   DONEPEZIL (ARICEPT) 10 MG TABLET    Take 10 mg by mouth at bedtime.   FEEDING SUPPLEMENT, ENSURE COMPLETE, (ENSURE COMPLETE) LIQD    Take 237 mLs by mouth 2 (two) times daily between meals.   HYDRALAZINE (APRESOLINE) 25 MG TABLET    Take 75 mg by mouth 3 (three) times daily.   LORATADINE (CLARITIN) 10 MG TABLET    Take 10 mg by mouth daily.   MECLIZINE (ANTIVERT) 12.5 MG TABLET    Take 2.5 tablets (31.25 mg total) by mouth 3 (three) times daily as needed for dizziness.   MEMANTINE (NAMENDA) 10 MG TABLET    Take 10 mg by mouth 2 (two) times daily.   MIRTAZAPINE (REMERON) 15 MG TABLET    Take 7.5 mg by mouth at bedtime.   NYSTATIN CREAM (MYCOSTATIN)    Apply 1 application topically 2 (two) times daily.   ZOSTER VACCINE LIVE [place]    Inject 0.65 mLs into the skin.  Modified Medications   No medications on file  Discontinued Medications   ASPIRIN EC 81 MG TABLET    Take 81 mg by mouth daily.     Physical Exam:  Filed Vitals:   12/04/13 0837  BP: 164/74  Pulse: 68  Temp: 98 F (36.7 C)  TempSrc: Oral  Resp: 20  Height: 6' (1.829 m)  Weight: 192 lb 6.4 oz (87.272 kg)  SpO2: 96%   Physical Exam  Constitutional: No distress.  HENT:  Head: Normocephalic and atraumatic.  Right Ear: External ear normal.  Left Ear: External ear normal.    Nose: Nose normal.  Mouth/Throat: Oropharynx is clear and moist. No oropharyngeal exudate.  Neck: Normal range of motion. Neck supple.  Cardiovascular: Normal rate, regular rhythm and normal heart sounds.   Pulmonary/Chest: Effort normal. No respiratory distress.  Diminished throughout   Abdominal: Soft. Normal appearance and bowel sounds are normal. He exhibits no distension. There is no tenderness.  Musculoskeletal: Normal range of motion. He exhibits no edema  and no tenderness.  Lymphadenopathy:    He has no cervical adenopathy.  Neurological: He is alert. He displays weakness. Coordination and gait abnormal.  Skin: Skin is warm and dry. He is not diaphoretic.  Psychiatric: His affect is blunt. He exhibits disordered thought content.     Labs reviewed: Basic Metabolic Panel:  Recent Labs  03/04/13 1720 03/05/13 1050  03/08/13 1130  05/15/13 1050 11/14/13 1557 11/15/13 0445 11/16/13 0320  NA 140 142  < >  --   < >  --  141 145 141  K 4.4 4.1  < >  --   < >  --  5.0 5.5* 5.7*  CL 103 106  < >  --   < >  --  105 110 109  CO2 25 24  < >  --   < >  --  23 24 21   GLUCOSE 169* 121*  < >  --   < >  --  113* 82 107*  BUN 24* 25*  < >  --   < >  --  36* 35* 39*  CREATININE 1.87* 1.77*  < >  --   < >  --  2.18* 2.10* 2.16*  CALCIUM 9.6 9.3  < >  --   < >  --  9.2 8.8 8.8  MG  --  1.9  --   --   --   --   --   --   --   TSH  --  0.957  --  1.707  --  2.041  --   --   --   < > = values in this interval not displayed. Liver Function Tests:  Recent Labs  05/15/13 0318 11/14/13 1557 11/15/13 0445  AST 19 17 16   ALT 12 11 10   ALKPHOS 64 76 67  BILITOT 0.2* 0.2* 0.2*  PROT 5.8* 6.9 6.3  ALBUMIN 3.1* 3.4* 3.1*    Recent Labs  11/14/13 1557  LIPASE 85*    Recent Labs  03/06/13 0450  AMMONIA 18   CBC:  Recent Labs  03/04/13 1720  11/14/13 1557 11/15/13 0445 11/16/13 0320  WBC 13.4*  < > 8.2 5.3 4.3  NEUTROABS 12.4*  --  6.2  --   --   HGB 14.0  < > 10.7*  9.9* 9.9*  HCT 40.1  < > 34.1* 31.6* 31.0*  MCV 90.3  < > 97.7 97.5 96.3  PLT 160  < > 180 159 166  < > = values in this interval not displayed. Lipid Panel:  Recent Labs  03/06/13 0450  CHOL 116  HDL 37*  LDLCALC 57  TRIG 109  CHOLHDL 3.1   TSH:  Recent Labs  03/05/13 1050 03/08/13 1130 05/15/13 1050  TSH 0.957 1.707 2.041   A1C: Lab Results  Component Value Date   HGBA1C 5.9* 03/05/2013     Assessment/Plan 1. Proximal muscle weakness -on-going, having trouble standing from a seated position -caregiver reports he would benefit from lift chair, she is having to provide a lot of support when he stands up, worried about falls -some weakness may be due to using 2 statin drugs, instructed to stop simvastatin at this time -Rx for lift chair given  2. History of fall -recommended for pt to use walker as PT instructed -will give rx for lift chair and 3:1 toilet seat   3. Type II or unspecified type diabetes mellitus with neurological manifestations, not stated as uncontrolled -not currently on  medications, labs to be done today for follow up next week - CBC with Differential - Basic metabolic panel - Lipid panel - Hemoglobin A1c  4. Essential hypertension, benign -has stopped lisinopril, did not take medication today - CBC with Differential  5. Pulmonary nodules/lesions, multiple -with ongoing pulmonary follow up - CBC with Differential

## 2013-12-05 ENCOUNTER — Telehealth: Payer: Self-pay | Admitting: *Deleted

## 2013-12-05 LAB — BASIC METABOLIC PANEL
BUN/Creatinine Ratio: 15 (ref 10–22)
BUN: 41 mg/dL — ABNORMAL HIGH (ref 8–27)
CO2: 18 mmol/L (ref 18–29)
Calcium: 8.8 mg/dL (ref 8.6–10.2)
Chloride: 104 mmol/L (ref 97–108)
Creatinine, Ser: 2.66 mg/dL — ABNORMAL HIGH (ref 0.76–1.27)
GFR calc Af Amer: 24 mL/min/{1.73_m2} — ABNORMAL LOW (ref >59)
GFR calc non Af Amer: 21 mL/min/{1.73_m2} — ABNORMAL LOW (ref >59)
Glucose: 107 mg/dL — ABNORMAL HIGH (ref 65–99)
Potassium: 4.7 mmol/L (ref 3.5–5.2)
Sodium: 138 mmol/L (ref 134–144)

## 2013-12-05 LAB — CBC WITH DIFFERENTIAL/PLATELET
Basophils Absolute: 0 10*3/uL (ref 0.0–0.2)
Basos: 0 %
Eos: 3 %
Eosinophils Absolute: 0.2 10*3/uL (ref 0.0–0.4)
HCT: 33.8 % — ABNORMAL LOW (ref 37.5–51.0)
Hemoglobin: 10.9 g/dL — ABNORMAL LOW (ref 12.6–17.7)
Immature Grans (Abs): 0 10*3/uL (ref 0.0–0.1)
Immature Granulocytes: 0 %
Lymphocytes Absolute: 1.2 10*3/uL (ref 0.7–3.1)
Lymphs: 19 %
MCH: 30.5 pg (ref 26.6–33.0)
MCHC: 32.2 g/dL (ref 31.5–35.7)
MCV: 95 fL (ref 79–97)
Monocytes Absolute: 0.6 10*3/uL (ref 0.1–0.9)
Monocytes: 9 %
Neutrophils Absolute: 4.2 10*3/uL (ref 1.4–7.0)
Neutrophils Relative %: 69 %
RBC: 3.57 x10E6/uL — ABNORMAL LOW (ref 4.14–5.80)
RDW: 13.4 % (ref 12.3–15.4)
WBC: 6.2 10*3/uL (ref 3.4–10.8)

## 2013-12-05 LAB — LIPID PANEL
Chol/HDL Ratio: 3.4 ratio units (ref 0.0–5.0)
Cholesterol, Total: 113 mg/dL (ref 100–199)
HDL: 33 mg/dL — ABNORMAL LOW (ref >39)
LDL Calculated: 62 mg/dL (ref 0–99)
Triglycerides: 92 mg/dL (ref 0–149)
VLDL Cholesterol Cal: 18 mg/dL (ref 5–40)

## 2013-12-05 LAB — HEMOGLOBIN A1C
Est. average glucose Bld gHb Est-mCnc: 117 mg/dL
Hgb A1c MFr Bld: 5.7 % — ABNORMAL HIGH (ref 4.8–5.6)

## 2013-12-05 NOTE — Telephone Encounter (Signed)
[name], Caregiver, called and stated that they dropped off the Rx for lift chair and toilet seat to [facility] and they stated that they needed OV notes to support it. I called [facility] and got fax number # [phone] attention Referral Support.

## 2013-12-05 NOTE — Telephone Encounter (Signed)
Message copied by Eilene Ghazi on Fri Dec 05, 2013 12:05 PM ------      Message from: Vivian, Rexene Edison      Created: Fri Dec 05, 2013  8:36 AM       I will see him next week.      Blood counts improved.      Kidney function has deteriorated some.  Must maintain his hydration (drink enough fluids).  Other labs are ok. ------

## 2013-12-05 NOTE — Telephone Encounter (Signed)
Spoke with Ms. Horton regarding making sure that Mr. Luhman was well hydrated.

## 2013-12-09 ENCOUNTER — Other Ambulatory Visit: Payer: Medicare Other

## 2013-12-11 ENCOUNTER — Encounter: Payer: Self-pay | Admitting: Internal Medicine

## 2013-12-11 ENCOUNTER — Ambulatory Visit (INDEPENDENT_AMBULATORY_CARE_PROVIDER_SITE_OTHER): Payer: Medicare Other | Admitting: Internal Medicine

## 2013-12-11 VITALS — BP 152/72 | HR 60 | Wt 189.0 lb

## 2013-12-11 DIAGNOSIS — H269 Unspecified cataract: Secondary | ICD-10-CM

## 2013-12-11 DIAGNOSIS — R918 Other nonspecific abnormal finding of lung field: Secondary | ICD-10-CM

## 2013-12-11 DIAGNOSIS — N400 Enlarged prostate without lower urinary tract symptoms: Secondary | ICD-10-CM

## 2013-12-11 DIAGNOSIS — M6281 Muscle weakness (generalized): Secondary | ICD-10-CM

## 2013-12-11 DIAGNOSIS — E1149 Type 2 diabetes mellitus with other diabetic neurological complication: Secondary | ICD-10-CM

## 2013-12-11 DIAGNOSIS — N3941 Urge incontinence: Secondary | ICD-10-CM

## 2013-12-11 DIAGNOSIS — F0391 Unspecified dementia with behavioral disturbance: Secondary | ICD-10-CM

## 2013-12-11 DIAGNOSIS — F03918 Unspecified dementia, unspecified severity, with other behavioral disturbance: Secondary | ICD-10-CM

## 2013-12-11 NOTE — Progress Notes (Signed)
Patient ID: [name], male   DOB: 1927/08/25, 80 y.o.   MRN: [id number]   Location:  [facility]  Code Status: full code  Allergies  Allergen Reactions  . Diovan [Valsartan] Other (See Comments)    Unknown     Chief Complaint  Patient presents with  . Medical Management of Chronic Issues    blood pressure, blood sugar, dementia. Here today with care giver Letta Median    HPI: Patient is a 80 y.o. white male seen in the office today for med mgt chronic diseases.  Has no aches and pains.  Feels cold.  Had incontinence episode while waiting to see me.  Having more difficulty holding his bladder so needs depends.  Needs something to give him some energy.  Did have blood pressure medicine today.  Needs cataract surgery--sight poor.  Had lid surgery and got skin cancer removed from his face.  Has large lobulated nodule in left upper lobe--sees Dr. Lamonte Sakai again 7/14 to f/u on this--appears they were considering radiation treatments w/o a biopsy.  His caregiver has been forcing water on him some.  Also getting lemonade and juice.  Less coffee.  Has only the left kidney.  Previous CT abdomen and pelvis--kidney looked good.  No hydronephrosis.  Review of Systems:  Review of Systems  Constitutional: Negative for fever and malaise/fatigue.  HENT: Negative for congestion.   Eyes: Positive for blurred vision.       Has bilateral cataracts  Respiratory: Negative for cough and shortness of breath.   Cardiovascular: Negative for chest pain.  Gastrointestinal: Negative for abdominal pain and constipation.  Genitourinary: Positive for urgency and frequency. Negative for dysuria.       Incontinence  Musculoskeletal: Positive for falls.  Skin: Negative for rash.  Neurological: Negative for dizziness, loss of consciousness, weakness and headaches.  Psychiatric/Behavioral: Positive for memory loss. Negative for depression.     Past Medical History  Diagnosis  Date  . Arthritis   . Depression   . Hypertension   . Hyperlipidemia   . Colon cancer   . AAA (abdominal aortic aneurysm) 2011  . CAD (coronary artery disease) 2003  . Anemia   . Diverticula of colon 2013  . Hypertrophy of prostate without urinary obstruction and other lower urinary tract symptoms (LUTS) 2008  . Hyperpotassemia 2008  . Gout, unspecified 2008  . Impacted cerumen 06/20/2006  . First degree atrioventricular block 2008  . Abnormality of gait 2007  . Other specified cardiac dysrhythmias(427.89) 2005  . Unspecified disorder of kidney and ureter 2005  . Edema 2005  . Malignant neoplasm of colon, unspecified site 2004  . Unspecified hereditary and idiopathic peripheral neuropathy 2004  . Chest pain, unspecified 2003  . Pain in joint, pelvic region and thigh 2004  . Pain in limb 2003  . Myocardial infarction 1984  . Exertional shortness of breath   . Pneumonia ~ 02/2013    "hospitalized for double pneumonia" (05/15/2013)  . Anxiety   . Chronic kidney disease     "born w/only 1 kidney" (05/15/2013)  . Chronic kidney disease (CKD), stage III (moderate)     [name] 05/14/2013 (05/15/2013)  . Postural dizziness     hospitalized 05/14/2013    Past Surgical History  Procedure Laterality Date  . Mediastinotomy    . Right colectomy  2004  . Inguinal hernia repair Right 1952    In [place]  . Coronary artery bypass graft  1996  Cyndia Bent, MD  . Cardiac catheterization    . Colon surgery    . Lesion removal N/A 08/12/2013    Procedure: EXCISION FACIAL SKIN CANCER X 3    (MINOR PROCEDURE) ;  Surgeon: Rozetta Nunnery, MD;  Location: [place];  Service: ENT;  Laterality: N/A;    Social History:   reports that he quit smoking about 31 years ago. His smoking use included Cigarettes. He has a 35 pack-year smoking history. He has quit using smokeless tobacco. His smokeless tobacco use included Snuff. He reports that he drinks alcohol. He reports that he does not use  illicit drugs.  Family History  Problem Relation Age of Onset  . Heart disease Father   . Pancreatic cancer Sister   . Cancer Sister     liver  . Cancer Brother     Leukemia  . Cancer Mother     ?    Medications: Patient's Medications  New Prescriptions   No medications on file  Previous Medications   ALPRAZOLAM (XANAX) 0.25 MG TABLET    TAKE 1 TABLET TWICE A DAY AS NEEDED   AMLODIPINE (NORVASC) 5 MG TABLET    Take 1 tablet (5 mg total) by mouth daily.   ATORVASTATIN (LIPITOR) 20 MG TABLET    Take 1 tablet (20 mg total) by mouth at bedtime.   CITALOPRAM (CELEXA) 20 MG TABLET    Take 1 tablet (20 mg total) by mouth daily.   [facility] ASPIRIN LOW DOSE 81 MG EC TABLET    TAKE 1 TABLET EVERY DAY   DIVALPROEX (DEPAKOTE SPRINKLE) 125 MG CAPSULE    Take 125 mg by mouth 2 (two) times daily.   DONEPEZIL (ARICEPT) 10 MG TABLET    Take 10 mg by mouth at bedtime.   FEEDING SUPPLEMENT, ENSURE COMPLETE, (ENSURE COMPLETE) LIQD    Take 237 mLs by mouth 2 (two) times daily between meals.   HYDRALAZINE (APRESOLINE) 25 MG TABLET    Take 75 mg by mouth 3 (three) times daily.   LORATADINE (CLARITIN) 10 MG TABLET    Take 10 mg by mouth daily.   MECLIZINE (ANTIVERT) 12.5 MG TABLET    Take 2.5 tablets (31.25 mg total) by mouth 3 (three) times daily as needed for dizziness.   MEMANTINE (NAMENDA) 10 MG TABLET    Take 10 mg by mouth 2 (two) times daily.   MIRTAZAPINE (REMERON) 15 MG TABLET    Take 7.5 mg by mouth at bedtime.   NYSTATIN CREAM (MYCOSTATIN)    Apply 1 application topically 2 (two) times daily.  Modified Medications   No medications on file  Discontinued Medications   ZOSTER VACCINE LIVE [place]    Inject 0.65 mLs into the skin.     Physical Exam: Filed Vitals:   12/11/13 1430  BP: 152/72  Pulse: 60  Weight: 189 lb (85.73 kg)  Physical Exam  Constitutional: No distress.  HENT:  Head: Normocephalic and atraumatic.  Cardiovascular: Normal rate, regular rhythm, normal heart sounds and intact  distal pulses.   Pulmonary/Chest: Effort normal. He has wheezes.  Abdominal: Soft. Bowel sounds are normal. He exhibits no distension and no mass. There is no tenderness.  Musculoskeletal: Normal range of motion. He exhibits no edema and no tenderness.  Neurological: He is alert.  Skin: Skin is warm and dry.  Psychiatric: He has a normal mood and affect.     Labs reviewed: Basic Metabolic Panel:  Recent Labs  03/04/13 1720 03/05/13 1050  03/08/13 1130  05/15/13 1050  11/15/13 0445 11/16/13 0320 12/04/13 0923  NA 140 142  < >  --   < >  --   < > 145 141 138  K 4.4 4.1  < >  --   < >  --   < > 5.5* 5.7* 4.7  CL 103 106  < >  --   < >  --   < > 110 109 104  CO2 25 24  < >  --   < >  --   < > 24 21 18   GLUCOSE 169* 121*  < >  --   < >  --   < > 82 107* 107*  BUN 24* 25*  < >  --   < >  --   < > 35* 39* 41*  CREATININE 1.87* 1.77*  < >  --   < >  --   < > 2.10* 2.16* 2.66*  CALCIUM 9.6 9.3  < >  --   < >  --   < > 8.8 8.8 8.8  MG  --  1.9  --   --   --   --   --   --   --   --   TSH  --  0.957  --  1.707  --  2.041  --   --   --   --   < > = values in this interval not displayed. Liver Function Tests:  Recent Labs  05/15/13 0318 11/14/13 1557 11/15/13 0445  AST 19 17 16   ALT 12 11 10   ALKPHOS 64 76 67  BILITOT 0.2* 0.2* 0.2*  PROT 5.8* 6.9 6.3  ALBUMIN 3.1* 3.4* 3.1*    Recent Labs  11/14/13 1557  LIPASE 85*    Recent Labs  03/06/13 0450  AMMONIA 18   CBC:  Recent Labs  03/04/13 1720  11/14/13 1557 11/15/13 0445 11/16/13 0320 12/04/13 0923  WBC 13.4*  < > 8.2 5.3 4.3 6.2  NEUTROABS 12.4*  --  6.2  --   --  4.2  HGB 14.0  < > 10.7* 9.9* 9.9* 10.9*  HCT 40.1  < > 34.1* 31.6* 31.0* 33.8*  MCV 90.3  < > 97.7 97.5 96.3 95  PLT 160  < > 180 159 166  --   < > = values in this interval not displayed. Lipid Panel:  Recent Labs  03/06/13 0450 12/04/13 0923  CHOL 116  --   HDL 37* 33*  LDLCALC 57 62  TRIG 109 92  CHOLHDL 3.1 3.4   Lab Results    Component Value Date   HGBA1C 5.7* 12/04/2013   Assessment/Plan 1. Pulmonary nodules/lesions, multiple - f/u with pulmonary as planned -discussed with him and his caregiver, Letta Median, that this is the most crucial thing to focus on right now   2. Proximal muscle weakness - his caregiver is requesting that he get some therapy for strengthening--I am not sure this will be very effective considering his possible lung cancer, but it may help him - Ambulatory referral to Physical Therapy  3. Dementia with behavioral disturbance - stable, he and the caregiver keep saying his memory isn't so bad, but he frequently does not remember what he is told - Ambulatory referral to Physical Therapy  4. Type II or unspecified type diabetes mellitus with neurological manifestations, not stated as uncontrolled -not on any medications at this time, diet only  5. Urge incontinence of urine -  will check prostate levels due to this and his failure to thrive - PSA  6. Cataracts, bilateral - Letta Median wants him to get his cataract surgery quickly--seems like this should not be priority at this point and his prognosis may be quite poor if he has lung cancer, but they want this done - Ambulatory referral to Ophthalmology  7. BPH (benign prostatic hyperplasia) -likely present and contributing to incontinence--previously had difficulty passing urine, now incontinent - PSA  Labs/tests ordered: Orders Placed This Encounter  Procedures  . PSA  . Ambulatory referral to Physical Therapy    Referral Priority:  Routine    Referral Type:  Physical Medicine    Referral Reason:  Specialty Services Required    Requested Specialty:  Physical Therapy    Number of Visits Requested:  1  . Ambulatory referral to Ophthalmology    Referral Priority:  Routine    Referral Type:  Consultation    Referral Reason:  Specialty Services Required    Requested Specialty:  Ophthalmology    Number of Visits Requested:  1    Next appt:   3 mos

## 2013-12-12 LAB — PSA: PSA: 6.7 ng/mL — ABNORMAL HIGH (ref 0.0–4.0)

## 2013-12-19 ENCOUNTER — Other Ambulatory Visit: Payer: Self-pay | Admitting: Internal Medicine

## 2013-12-23 ENCOUNTER — Encounter: Payer: Self-pay | Admitting: Emergency Medicine

## 2013-12-23 ENCOUNTER — Ambulatory Visit (INDEPENDENT_AMBULATORY_CARE_PROVIDER_SITE_OTHER): Payer: Medicare Other | Admitting: Emergency Medicine

## 2013-12-23 VITALS — BP 140/92 | HR 76 | Ht 72.0 in | Wt 192.0 lb

## 2013-12-23 DIAGNOSIS — J449 Chronic obstructive pulmonary disease, unspecified: Secondary | ICD-10-CM

## 2013-12-23 DIAGNOSIS — R918 Other nonspecific abnormal finding of lung field: Secondary | ICD-10-CM

## 2013-12-23 MED ORDER — TIOTROPIUM BROMIDE MONOHYDRATE 2.5 MCG/ACT IN AERS: 1.0000 | INHALATION_SPRAY | Freq: Every day | RESPIRATORY_TRACT | Status: DC

## 2013-12-23 NOTE — Progress Notes (Signed)
Subjective:    Patient ID: [name], male    DOB: Dec 22, 1927, 80 y.o.   MRN: [id number]  HPI 80 yo man, former smoker, hx of dementia, CAD, AAA, HTN, recent admission for symptomatic bradycardia. Part of his evaluation included a CT scan of the chest performed on 05/16/13 that identified to slowly enlarging left upper lobe groundglass nodules. Also seen was a more solid nodule in the left lower lobe which was slightly enlarged compared with his study on 03/06/13. Bilateral lower lobe infiltrates that were seen on his scan from 9/25 were resolved on the new study. He is referred for evaluation of his pulmonary nodules.    ROV 11/26/13 -- follows up to review repeat CT scan of the chest that had shown a slowly enlarging left upper lobe nodule. He is having more dyspnea over the last three months. The nodule has tripled in size since   ROV 12/23/13 -- follows for COPD and dyspnea, as well as an enlarging LLL sup seg nodule and other LUL sub-solid nodules. Last time we started spiriva respimat to see if he would clinically respond > he tells me that his breathing is better, family agrees. He expresses interest in trying to get XRT   Review of Systems  Constitutional: Negative for fever and unexpected weight change.  HENT: Positive for postnasal drip. Negative for congestion, dental problem, ear pain, nosebleeds, rhinorrhea, sinus pressure, sneezing, sore throat and trouble swallowing.   Eyes: Positive for redness. Negative for itching.  Respiratory: Positive for cough. Negative for chest tightness, shortness of breath and wheezing.   Cardiovascular: Negative for palpitations and leg swelling.  Gastrointestinal: Negative for nausea and vomiting.  Genitourinary: Negative for dysuria.  Musculoskeletal: Negative for joint swelling.  Skin: Negative for rash.  Neurological: Negative for headaches.  Hematological: Bruises/bleeds easily.  Psychiatric/Behavioral: Positive for dysphoric mood. The  patient is nervous/anxious.    Past Medical History  Diagnosis Date  . Arthritis   . Depression   . Hypertension   . Hyperlipidemia   . Colon cancer   . AAA (abdominal aortic aneurysm) 2011  . CAD (coronary artery disease) 2003  . Anemia   . Diverticula of colon 2013  . Hypertrophy of prostate without urinary obstruction and other lower urinary tract symptoms (LUTS) 2008  . Hyperpotassemia 2008  . Gout, unspecified 2008  . Impacted cerumen 06/20/2006  . First degree atrioventricular block 2008  . Abnormality of gait 2007  . Other specified cardiac dysrhythmias(427.89) 2005  . Unspecified disorder of kidney and ureter 2005  . Edema 2005  . Malignant neoplasm of colon, unspecified site 2004  . Unspecified hereditary and idiopathic peripheral neuropathy 2004  . Chest pain, unspecified 2003  . Pain in joint, pelvic region and thigh 2004  . Pain in limb 2003  . Myocardial infarction 1984  . Exertional shortness of breath   . Pneumonia ~ 02/2013    "hospitalized for double pneumonia" (05/15/2013)  . Anxiety   . Chronic kidney disease     "born w/only 1 kidney" (05/15/2013)  . Chronic kidney disease (CKD), stage III (moderate)     [name] 05/14/2013 (05/15/2013)  . Postural dizziness     hospitalized 05/14/2013     Family History  Problem Relation Age of Onset  . Heart disease Father   . Pancreatic cancer Sister   . Cancer Sister     liver  . Cancer Brother     Leukemia  . Cancer Mother     ?  History   Social History  . Marital Status: Single    Spouse Name: N/A    Number of Children: 0  . Years of Education: N/A   Occupational History  .     Social History Main Topics  . Smoking status: Former Smoker -- 1.00 packs/day for 35 years    Types: Cigarettes    Quit date: 06/12/1982  . Smokeless tobacco: Former Systems developer    Types: Snuff  . Alcohol Use: Yes     Comment: 05/15/2013 "have a beer sometimes; not very often"  . Drug Use: No  . Sexual Activity: No   Other  Topics Concern  . Not on file   Social History Narrative  . No narrative on file     Allergies  Allergen Reactions  . Diovan [Valsartan] Other (See Comments)    Unknown      Outpatient Prescriptions Prior to Visit  Medication Sig Dispense Refill  . ALPRAZolam (XANAX) 0.25 MG tablet TAKE 1 TABLET TWICE A DAY AS NEEDED  60 tablet  1  . amLODipine (NORVASC) 5 MG tablet TAKE 1 TABLET BY MOUTH EVERY DAY  30 tablet  5  . atorvastatin (LIPITOR) 20 MG tablet TAKE 1 TABLET BY MOUTH EVERY DAY AT BEDTIME  30 tablet  5  . citalopram (CELEXA) 20 MG tablet Take 1 tablet (20 mg total) by mouth daily.  90 tablet  3  . CVS ASPIRIN LOW DOSE 81 MG EC tablet TAKE 1 TABLET EVERY DAY  30 tablet  5  . divalproex (DEPAKOTE SPRINKLE) 125 MG capsule Take 125 mg by mouth 2 (two) times daily.      Marland Kitchen donepezil (ARICEPT) 10 MG tablet Take 10 mg by mouth at bedtime.      . feeding supplement, ENSURE COMPLETE, (ENSURE COMPLETE) LIQD Take 237 mLs by mouth 2 (two) times daily between meals.  30 Bottle  5  . hydrALAZINE (APRESOLINE) 25 MG tablet Take 75 mg by mouth 3 (three) times daily.      Marland Kitchen loratadine (CLARITIN) 10 MG tablet Take 10 mg by mouth daily.      . meclizine (ANTIVERT) 12.5 MG tablet Take 2.5 tablets (31.25 mg total) by mouth 3 (three) times daily as needed for dizziness.  30 tablet  0  . memantine (NAMENDA) 10 MG tablet Take 10 mg by mouth 2 (two) times daily.      . mirtazapine (REMERON) 15 MG tablet Take 7.5 mg by mouth at bedtime.      Marland Kitchen nystatin cream (MYCOSTATIN) Apply 1 application topically 2 (two) times daily.  30 g  3   No facility-administered medications prior to visit.         Objective:   Physical Exam Filed Vitals:   12/23/13 1453  BP: 140/92  Pulse: 76  Height: 6' (1.829 m)  Weight: 192 lb (87.091 kg)  SpO2: 96%   Gen: Quiet, somewhat frail man, in no distress,  normal affect  ENT: No lesions,  mouth clear,  oropharynx clear, no postnasal drip  Neck: No JVD, no TMG, no  carotid bruits  Lungs: No use of accessory muscles, clear without rales or rhonchi  Cardiovascular: RRR, heart sounds normal, no murmur or gallops, no peripheral edema  Musculoskeletal: No deformities, no cyanosis or clubbing  Neuro: alert, non focal  Skin: Warm, no lesions or rashes  11/24/13 --  COMPARISON: Chest CT 05/16/2013.  FINDINGS:  Mediastinum: Heart size is normal. There is no significant  pericardial fluid, thickening or  pericardial calcification. There is  atherosclerosis of the thoracic aorta, the great vessels of the  mediastinum and the coronary arteries, including calcified  atherosclerotic plaque in the left main, left anterior descending,  left circumflex and right coronary arteries. Status post median  sternotomy for CABG, including LIMA to the LAD. No pathologically  enlarged mediastinal or hilar lymph nodes. Please note that accurate  exclusion of hilar adenopathy is limited on noncontrast CT scans.  Esophagus is unremarkable in appearance.  Lungs/Pleura: Significant interval enlargement of what is now a 2.1  x 2.5 cm macrolobulated pulmonary nodule in the superior segment of  the left lower lobe (image 30 of series 3), which previously  measured only 1.1 cm. This nodule is cephalad and immediately  adjacent to a subsegmental bronchus off the bronchus to the superior  segment of the left lower lobe. Other previously noted left lower  lobe nodule appears slightly smaller, subpleural in location  adjacent to the the left major fissure (image 41 of series 3)  measuring 7 mm on today's examination. Two ground-glass attenuation  nodules are again noted in the left upper lobe measuring 2.0 x 1.4  cm (image 19 of series 3), and 1.8 x 1.4 cm (image 23 of series 3)  with a small 3 mm central solid component (image 24 of series 2),  similar to the prior examination. Other smaller more ill-defined  areas of ground-glass attenuation are also noted scattered  throughout  the right lung, similar to the prior examination. Areas  of nodular pleuroparenchymal thickening are also noted in the apices  of the lungs bilaterally, presumably chronic post infectious or  inflammatory scarring. Small area of scarring in the inferior  segment of the lingula is unchanged. No acute consolidative airspace  disease. No pleural effusions.  Upper Abdomen: Unremarkable.  Musculoskeletal: Multiple old healed left-sided rib fractures. There  are no aggressive appearing lytic or blastic lesions noted in the  visualized portions of the skeleton. Median sternotomy wires.  IMPRESSION:  1. Marked interval enlargement of a 2.1 x 2.5 cm macrolobulated  nodule in the superior segment of the left lower lobe, highly  suspicious for a primary bronchogenic neoplasm. Correlation with  PET-CT or biopsy is suggested for further diagnostic and staging  purposes. No definite mediastinal or hilar lymphadenopathy is  appreciated at this time on today's non contrast CT examination.  2. 2 sub solid nodules in the left upper lobe appear similar to the  prior examination, as detailed above. Continued attention on future  followup studies is recommended.  3. Atherosclerosis, including left main and 3 vessel coronary artery  disease. Status post median sternotomy for CABG, including LIMA to  the LAD.       Assessment & Plan:  Pulmonary nodules Almost certainly malignancy given pattern on serial CT scans. He is a former smoker, has hx remote colon CA. This is probably primary lung CA. He also has small GG lesions. I discussed the case with Dr Pablo Ledger and she is willing to consider XRT without a tissue dx. I will refer Mr R to meet her and discuss further.    COPD mixed type - will continue spiriva respimat

## 2013-12-23 NOTE — Assessment & Plan Note (Signed)
Almost certainly malignancy given pattern on serial CT scans. He is a former smoker, has hx remote colon CA. This is probably primary lung CA. He also has small GG lesions. I discussed the case with Dr Pablo Ledger and she is willing to consider XRT without a tissue dx. I will refer [name] to meet her and discuss further.

## 2013-12-23 NOTE — Assessment & Plan Note (Signed)
-   will continue spiriva respimat

## 2013-12-23 NOTE — Patient Instructions (Signed)
We will continue Spiriva once a day We will refer you to meet Dr Pablo Ledger with Radiation Oncology to discuss treatment for your left lung nodule.  Follow with Dr Lamonte Sakai in 3 months or sooner if you have any problems.

## 2014-01-02 ENCOUNTER — Ambulatory Visit: Payer: Medicare Other | Attending: Internal Medicine | Admitting: Rehabilitative and Restorative Service Providers"

## 2014-01-02 DIAGNOSIS — R269 Unspecified abnormalities of gait and mobility: Secondary | ICD-10-CM | POA: Insufficient documentation

## 2014-01-02 DIAGNOSIS — M6281 Muscle weakness (generalized): Secondary | ICD-10-CM | POA: Insufficient documentation

## 2014-01-02 DIAGNOSIS — IMO0001 Reserved for inherently not codable concepts without codable children: Secondary | ICD-10-CM | POA: Diagnosis not present

## 2014-01-06 ENCOUNTER — Ambulatory Visit: Payer: Medicare Other | Admitting: Physical Therapy

## 2014-01-06 DIAGNOSIS — IMO0001 Reserved for inherently not codable concepts without codable children: Secondary | ICD-10-CM | POA: Diagnosis not present

## 2014-01-08 NOTE — Progress Notes (Signed)
Thoracic Location of Tumor / Histology:Left upper lobe nodule  Patient presented   Biopsies of :None performed on lung.  Tobacco/Marijuana/Snuff/ETOH use: Smoked cigarettes 1 ppd for 35 years , quit in 1984.former smokeless tobacco=snuff,beer occasionally  Past/Anticipated interventions by cardiothoracic surgery, if any: s/p median sternotomy for CABG including LIMA to the LAD   Past/Anticipated interventions by medical oncology, if and  Had chemotherapy under guidance of Dr.Peter Ennever in 2005  Signs/Symptoms  Weight changes, if any: maintained  Respiratory complaints, if MBE:[id number]  Hemoptysis, if any:No  Pain issues, if any:No  SAFETY ISSUES:yes  Prior radiation? Sister states he had radiation for colon cancer date?  Pacemaker/ICD? No  Possible current pregnancy?N/A  Is the patient on methotrexate? 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

## 2014-01-09 ENCOUNTER — Ambulatory Visit: Payer: Medicare Other | Admitting: Rehabilitative and Restorative Service Providers"

## 2014-01-09 DIAGNOSIS — IMO0001 Reserved for inherently not codable concepts without codable children: Secondary | ICD-10-CM | POA: Diagnosis not present

## 2014-01-15 ENCOUNTER — Ambulatory Visit
Admission: RE | Admit: 2014-01-15 | Discharge: 2014-01-15 | Disposition: A | Discharge status: Home / Self Care | Payer: Medicare Other | Source: Ambulatory Visit | Attending: Radiation Oncology | Admitting: Radiation Oncology

## 2014-01-15 ENCOUNTER — Encounter: Payer: Self-pay | Admitting: Radiation Oncology

## 2014-01-15 VITALS — BP 179/80 | HR 58 | Temp 97.5°F | Wt 192.0 lb

## 2014-01-15 DIAGNOSIS — C3410 Malignant neoplasm of upper lobe, unspecified bronchus or lung: Secondary | ICD-10-CM

## 2014-01-15 DIAGNOSIS — F411 Generalized anxiety disorder: Secondary | ICD-10-CM | POA: Insufficient documentation

## 2014-01-15 DIAGNOSIS — Z510 Encounter for antineoplastic radiation therapy: Secondary | ICD-10-CM | POA: Insufficient documentation

## 2014-01-15 DIAGNOSIS — Z951 Presence of aortocoronary bypass graft: Secondary | ICD-10-CM | POA: Insufficient documentation

## 2014-01-15 DIAGNOSIS — I10 Essential (primary) hypertension: Secondary | ICD-10-CM | POA: Insufficient documentation

## 2014-01-15 DIAGNOSIS — C189 Malignant neoplasm of colon, unspecified: Secondary | ICD-10-CM

## 2014-01-15 DIAGNOSIS — I2510 Atherosclerotic heart disease of native coronary artery without angina pectoris: Secondary | ICD-10-CM | POA: Insufficient documentation

## 2014-01-15 DIAGNOSIS — Z9049 Acquired absence of other specified parts of digestive tract: Secondary | ICD-10-CM | POA: Diagnosis not present

## 2014-01-15 DIAGNOSIS — F3289 Other specified depressive episodes: Secondary | ICD-10-CM | POA: Insufficient documentation

## 2014-01-15 DIAGNOSIS — H8110 Benign paroxysmal vertigo, unspecified ear: Secondary | ICD-10-CM | POA: Insufficient documentation

## 2014-01-15 DIAGNOSIS — Z87891 Personal history of nicotine dependence: Secondary | ICD-10-CM | POA: Insufficient documentation

## 2014-01-15 DIAGNOSIS — I252 Old myocardial infarction: Secondary | ICD-10-CM | POA: Insufficient documentation

## 2014-01-15 DIAGNOSIS — Z7982 Long term (current) use of aspirin: Secondary | ICD-10-CM | POA: Insufficient documentation

## 2014-01-15 DIAGNOSIS — F329 Major depressive disorder, single episode, unspecified: Secondary | ICD-10-CM | POA: Insufficient documentation

## 2014-01-15 DIAGNOSIS — Z9221 Personal history of antineoplastic chemotherapy: Secondary | ICD-10-CM | POA: Diagnosis not present

## 2014-01-15 NOTE — Progress Notes (Signed)
Please see the Nurse Progress Note in the MD Initial Consult Encounter for this patient. 

## 2014-01-16 ENCOUNTER — Encounter: Payer: Medicare Other | Admitting: Physical Therapy

## 2014-01-16 NOTE — Addendum Note (Signed)
Encounter addended by: Thea Silversmith, MD on: 01/16/2014 10:07 AM<BR>     Documentation filed: Follow-up Section, LOS Section

## 2014-01-16 NOTE — Progress Notes (Signed)
Radiation Oncology         [phone]) [phone] ________________________________  Initial outpatient Consultation - Date: 01/15/2014   Name: [name] MRN: [id number]   DOB: [birth year]  REFERRING PHYSICIAN: Collene Gobble, MD  DIAGNOSIS:    ICD-9-CM   1. Colon cancer 153.9 NM PET Image Initial (PI) Skull Base To Thigh   HISTORY OF PRESENT ILLNESS::[name] is a 80 y.o. male  Who was hospitalized recently for bradycardia. His evaluation included a CT scan of the chest performe don 05/16/13 that showed a left upper lobe nodules which had increased in size.  IN particular a left lower lobe nodule had significantly enlarging mass measuring 2.1 x 2.5 cm (previously measuring 1.1 cm). He has a history of colon cancer with resection and chemotherapy by Dr. Marin Olp.  He tolerated chemotherapy very poorly per his sister. He has not had a biopsy or a PET scan.  He is asymptomatic from this mass. He is mostly sedentary and lives alone but requires 24 hour care. He has some dementia and is hard of hearing. He has stable shortness of breath and no hemoptysis. He presents with his caregiver and sister today for my opinion regarding radiation in the management of his disease. He is not felt to be candidate for biopsy or surgery given his compromised pulmonary status, his age and general medical condition.   PREVIOUS RADIATION THERAPY: No  PAST MEDICAL HISTORY:  has a past medical history of Arthritis; Depression; Hypertension; Hyperlipidemia; Colon cancer; AAA (abdominal aortic aneurysm) (2011); CAD (coronary artery disease) (2003); Anemia; Diverticula of colon (2013); Hypertrophy of prostate without urinary obstruction and other lower urinary tract symptoms (LUTS) (2008); Hyperpotassemia (2008); Gout, unspecified (2008); Impacted cerumen (06/20/2006); First degree atrioventricular block (2008); Abnormality of gait (2007); Other specified cardiac dysrhythmias(427.89) (2005); Unspecified disorder of kidney  and ureter (2005); Edema (2005); Malignant neoplasm of colon, unspecified site (2004); Unspecified hereditary and idiopathic peripheral neuropathy (2004); Chest pain, unspecified (2003); Pain in joint, pelvic region and thigh (2004); Pain in limb (2003); Myocardial infarction (1984); Exertional shortness of breath; Pneumonia (~ 02/2013); Anxiety; Chronic kidney disease; Chronic kidney disease (CKD), stage III (moderate); and Postural dizziness.    PAST SURGICAL HISTORY: Past Surgical History  Procedure Laterality Date  . Mediastinotomy    . Right colectomy  2004  . Inguinal hernia repair Right 1952    In [place]  . Coronary artery bypass graft  1996    Bartle, MD  . Cardiac catheterization    . Colon surgery    . Lesion removal N/A 08/12/2013    Procedure: EXCISION FACIAL SKIN CANCER X 3    (MINOR PROCEDURE) ;  Surgeon: Rozetta Nunnery, MD;  Location: [facility];  Service: ENT;  Laterality: N/A;    FAMILY HISTORY:  Family History  Problem Relation Age of Onset  . Heart disease Father   . Pancreatic cancer Sister   . Cancer Sister     liver  . Cancer Brother     Leukemia  . Cancer Mother     ?    SOCIAL HISTORY:  History  Substance Use Topics  . Smoking status: Former Smoker -- 1.00 packs/day for 35 years    Types: Cigarettes    Quit date: 06/12/1982  . Smokeless tobacco: Former Systems developer    Types: Snuff  . Alcohol Use: Yes     Comment: 05/15/2013 "have a beer sometimes; not very often"    ALLERGIES: Diovan  MEDICATIONS:  Current  Outpatient Prescriptions  Medication Sig Dispense Refill  . ALPRAZolam (XANAX) 0.25 MG tablet TAKE 1 TABLET TWICE A DAY AS NEEDED  60 tablet  1  . amLODipine (NORVASC) 5 MG tablet TAKE 1 TABLET BY MOUTH EVERY DAY  30 tablet  5  . atorvastatin (LIPITOR) 20 MG tablet TAKE 1 TABLET BY MOUTH EVERY DAY AT BEDTIME  30 tablet  5  . citalopram (CELEXA) 20 MG tablet Take 1 tablet (20 mg total) by mouth daily.  90 tablet  3  . [facility] ASPIRIN LOW  DOSE 81 MG EC tablet TAKE 1 TABLET EVERY DAY  30 tablet  5  . divalproex (DEPAKOTE SPRINKLE) 125 MG capsule Take 125 mg by mouth 2 (two) times daily.      Marland Kitchen donepezil (ARICEPT) 10 MG tablet Take 10 mg by mouth at bedtime.      . feeding supplement, ENSURE COMPLETE, (ENSURE COMPLETE) LIQD Take 237 mLs by mouth 2 (two) times daily between meals.  30 Bottle  5  . hydrALAZINE (APRESOLINE) 25 MG tablet Take 75 mg by mouth 3 (three) times daily.      . meclizine (ANTIVERT) 12.5 MG tablet Take 2.5 tablets (31.25 mg total) by mouth 3 (three) times daily as needed for dizziness.  30 tablet  0  . memantine (NAMENDA) 10 MG tablet Take 10 mg by mouth 2 (two) times daily.      . mirtazapine (REMERON) 15 MG tablet Take 7.5 mg by mouth at bedtime.      . Tiotropium Bromide Monohydrate (SPIRIVA RESPIMAT) 2.5 MCG/ACT AERS Inhale 1 puff into the lungs daily.  1 Inhaler  6   No current facility-administered medications for this encounter.    REVIEW OF SYSTEMS:  A 15 point review of systems is documented in the electronic medical record. This was obtained by the nursing staff. However, I reviewed this with the patient to discuss relevant findings and make appropriate changes.  Pertinent items are noted in HPI.  PHYSICAL EXAM:  Filed Vitals:   01/15/14 0850  BP: 179/80  Pulse: 58  Temp: 97.5 F (36.4 C)  .192 lb (87.091 kg). Pleasant male in a wheelchair. Oriented x 3. Normal respiratory effort.   LABORATORY DATA:  Lab Results  Component Value Date   WBC 6.2 12/04/2013   HGB 10.9* 12/04/2013   HCT 33.8* 12/04/2013   MCV 95 12/04/2013   PLT 166 11/16/2013   Lab Results  Component Value Date   NA 138 12/04/2013   K 4.7 12/04/2013   CL 104 12/04/2013   CO2 18 12/04/2013   Lab Results  Component Value Date   ALT 10 11/15/2013   AST 16 11/15/2013   ALKPHOS 67 11/15/2013   BILITOT 0.2* 11/15/2013     RADIOGRAPHY: No results found.    IMPRESSION: T1N0 NSCLC or metastatic colon cancer with increased growth.    PLAN:I discussed with the family that this was likely cancer given its growth from one can to the next and its appearance. We discussed tht the differential of a lung nodule would be granulomatous disease, scar or infection and the only way to tell for sure was biopsy.  He is not a candidate for biopsy per pulmonary so we discussed proceeding the radiation without biopsy.  I have ordered a PET scan just for confirmation that this does indeed malignant range uptake and also to stage for evidence of disease outside the chest. The PET will also aid in treatment planning. Given the location, I am  not sure he will be a candidate for SBRT as it is in close proximity to his major vessels and major bronchi.  We will schedule him for simulation after his PET and at most will need 27 fracitonated treatments which he will tolerate well and has an excellent chance of local control.  We discussed shoulder soreness, fatigue and possible skin redness as side effects from treatment. We discussed the process of simulation and use of 4D CT for tumor tracking. We discussed damage to critical structures within the chest.  I will schedule his simulation after his PET scan.  I spent 60 minutes  face to face with the patient and more than 50% of that time was spent in counseling and/or coordination of care.   ------------------------------------------------  Thea Silversmith, MD

## 2014-01-20 ENCOUNTER — Ambulatory Visit (HOSPITAL_COMMUNITY): Payer: Medicare Other

## 2014-01-20 ENCOUNTER — Ambulatory Visit: Payer: Medicare Other | Admitting: Radiation Oncology

## 2014-01-21 ENCOUNTER — Ambulatory Visit: Payer: Medicare Other | Attending: Internal Medicine | Admitting: Physical Therapy

## 2014-01-21 DIAGNOSIS — R269 Unspecified abnormalities of gait and mobility: Secondary | ICD-10-CM | POA: Diagnosis not present

## 2014-01-21 DIAGNOSIS — M6281 Muscle weakness (generalized): Secondary | ICD-10-CM | POA: Insufficient documentation

## 2014-01-21 DIAGNOSIS — IMO0001 Reserved for inherently not codable concepts without codable children: Secondary | ICD-10-CM | POA: Diagnosis present

## 2014-01-22 ENCOUNTER — Encounter (HOSPITAL_COMMUNITY): Payer: Self-pay

## 2014-01-22 ENCOUNTER — Ambulatory Visit (HOSPITAL_COMMUNITY)
Admission: RE | Admit: 2014-01-22 | Discharge: 2014-01-22 | Disposition: A | Discharge status: Home / Self Care | Payer: Medicare Other | Source: Ambulatory Visit | Attending: Diagnostic Radiology | Admitting: Diagnostic Radiology

## 2014-01-22 DIAGNOSIS — C189 Malignant neoplasm of colon, unspecified: Secondary | ICD-10-CM | POA: Insufficient documentation

## 2014-01-22 DIAGNOSIS — Z9049 Acquired absence of other specified parts of digestive tract: Secondary | ICD-10-CM | POA: Insufficient documentation

## 2014-01-22 DIAGNOSIS — J984 Other disorders of lung: Secondary | ICD-10-CM | POA: Insufficient documentation

## 2014-01-22 LAB — GLUCOSE, CAPILLARY: Glucose-Capillary: 128 mg/dL — ABNORMAL HIGH (ref 70–99)

## 2014-01-22 MED ORDER — FLUDEOXYGLUCOSE F - 18 (FDG) INJECTION: 9.5000 | Freq: Once | INTRAVENOUS | Status: AC | PRN

## 2014-01-23 ENCOUNTER — Ambulatory Visit: Payer: Medicare Other | Admitting: Physical Therapy

## 2014-01-27 ENCOUNTER — Ambulatory Visit
Admission: RE | Admit: 2014-01-27 | Discharge: 2014-01-27 | Disposition: A | Discharge status: Home / Self Care | Payer: Medicare Other | Source: Ambulatory Visit | Attending: Radiation Oncology | Admitting: Radiation Oncology

## 2014-01-27 ENCOUNTER — Encounter: Payer: Medicare Other | Admitting: Physical Therapy

## 2014-01-27 DIAGNOSIS — Z510 Encounter for antineoplastic radiation therapy: Secondary | ICD-10-CM | POA: Diagnosis not present

## 2014-01-27 DIAGNOSIS — C3410 Malignant neoplasm of upper lobe, unspecified bronchus or lung: Secondary | ICD-10-CM

## 2014-01-27 NOTE — Progress Notes (Signed)
[facility] Simulation and Treatment Planning Note   Name: [name] MRN: [id number]  Date: 01/27/2014  DOB: 09-27-1927  Status: outpatient    DIAGNOSIS: Left lung cancer    CONSENT VERIFIED: yes   SET UP AND IMMOBILIZATION: Patient is setup supine with arms in a wing board.   NARRATIVE: The patient was brought to the [facility].  Identity was confirmed.  All relevant records and images related to the planned course of therapy were reviewed. Upon review of the location of the lesion and its proximity to the aorta in addition to the presence of other nodules and the extension of the tumor towards the left hilum, I elected not to pursue SBRT and will instead treat with a hypofractionated approach.   The patient was positioned in a stable reproducible clinical set-up for radiation therapy.  CT images were obtained.  4D CT images were obtained to visualize tumor motion. Skin markings were placed.  The CT images were loaded into the planning software where the target and avoidance structures were contoured.  The radiation prescription was entered and confirmed.   TREATMENT PLANNING NOTE:  Treatment planning then occurred. I have requested 3D simulation with [facility] of the spinal cord, total lungs and gross tumor volume. I have also requested mlcs and an isodose plan.

## 2014-01-30 ENCOUNTER — Other Ambulatory Visit: Payer: Self-pay | Admitting: Internal Medicine

## 2014-01-30 ENCOUNTER — Ambulatory Visit: Payer: Medicare Other | Admitting: Physical Therapy

## 2014-01-30 DIAGNOSIS — IMO0001 Reserved for inherently not codable concepts without codable children: Secondary | ICD-10-CM | POA: Diagnosis not present

## 2014-02-02 DIAGNOSIS — Z510 Encounter for antineoplastic radiation therapy: Secondary | ICD-10-CM | POA: Diagnosis not present

## 2014-02-03 ENCOUNTER — Ambulatory Visit: Payer: Medicare Other | Admitting: Radiation Oncology

## 2014-02-03 ENCOUNTER — Ambulatory Visit
Admission: RE | Admit: 2014-02-03 | Discharge: 2014-02-03 | Disposition: A | Discharge status: Home / Self Care | Payer: Medicare Other | Source: Ambulatory Visit | Attending: Radiation Oncology | Admitting: Radiation Oncology

## 2014-02-03 ENCOUNTER — Encounter: Payer: Medicare Other | Admitting: Rehabilitative and Restorative Service Providers"

## 2014-02-03 DIAGNOSIS — Z510 Encounter for antineoplastic radiation therapy: Secondary | ICD-10-CM | POA: Diagnosis not present

## 2014-02-03 DIAGNOSIS — C189 Malignant neoplasm of colon, unspecified: Secondary | ICD-10-CM

## 2014-02-03 NOTE — Progress Notes (Signed)
  Radiation Oncology         [phone] ________________________________  Name: [name] MRN: [id number]  Date: 02/03/2014  DOB: 04-13-1928  Simulation Verification Note  Status: outpatient  NARRATIVE: The patient was brought to the treatment unit and placed in the planned treatment position. The clinical setup was verified. Then port films were obtained and uploaded to the radiation oncology medical record software.  The treatment beams were carefully compared against the planned radiation fields. The position location and shape of the radiation fields was reviewed. The targeted volume of tissue appears appropriately covered by the radiation beams. Organs at risk appear to be excluded as planned.  Based on my personal review, I approved the simulation verification. The patient's treatment will proceed as planned.  ------------------------------------------------  Thea Silversmith, MD

## 2014-02-04 ENCOUNTER — Ambulatory Visit
Admission: RE | Admit: 2014-02-04 | Discharge: 2014-02-04 | Disposition: A | Discharge status: Home / Self Care | Payer: Medicare Other | Source: Ambulatory Visit | Attending: Radiation Oncology | Admitting: Radiation Oncology

## 2014-02-04 ENCOUNTER — Ambulatory Visit
Admission: RE | Admit: 2014-02-04 | Payer: Medicare Other | Source: Ambulatory Visit | Attending: Radiation Oncology | Admitting: Radiation Oncology

## 2014-02-04 DIAGNOSIS — Z510 Encounter for antineoplastic radiation therapy: Secondary | ICD-10-CM | POA: Diagnosis not present

## 2014-02-04 HISTORY — PX: CATARACT EXTRACTION: SUR2

## 2014-02-05 ENCOUNTER — Ambulatory Visit: Payer: Medicare Other

## 2014-02-06 ENCOUNTER — Ambulatory Visit: Payer: Medicare Other | Admitting: Physical Therapy

## 2014-02-06 ENCOUNTER — Ambulatory Visit
Admission: RE | Admit: 2014-02-06 | Discharge: 2014-02-06 | Disposition: A | Discharge status: Home / Self Care | Payer: Medicare Other | Source: Ambulatory Visit | Attending: Radiation Oncology | Admitting: Radiation Oncology

## 2014-02-06 DIAGNOSIS — Z510 Encounter for antineoplastic radiation therapy: Secondary | ICD-10-CM | POA: Diagnosis not present

## 2014-02-06 DIAGNOSIS — IMO0001 Reserved for inherently not codable concepts without codable children: Secondary | ICD-10-CM | POA: Diagnosis not present

## 2014-02-09 ENCOUNTER — Ambulatory Visit
Admission: RE | Admit: 2014-02-09 | Discharge: 2014-02-09 | Disposition: A | Discharge status: Home / Self Care | Payer: Medicare Other | Source: Ambulatory Visit | Attending: Radiation Oncology | Admitting: Radiation Oncology

## 2014-02-09 DIAGNOSIS — Z510 Encounter for antineoplastic radiation therapy: Secondary | ICD-10-CM | POA: Diagnosis not present

## 2014-02-10 ENCOUNTER — Ambulatory Visit
Admission: RE | Admit: 2014-02-10 | Discharge: 2014-02-10 | Disposition: A | Discharge status: Home / Self Care | Payer: Medicare Other | Source: Ambulatory Visit | Attending: Radiation Oncology | Admitting: Radiation Oncology

## 2014-02-10 ENCOUNTER — Ambulatory Visit: Payer: Medicare Other | Admitting: Radiation Oncology

## 2014-02-10 ENCOUNTER — Ambulatory Visit: Payer: Medicare Other | Attending: Internal Medicine | Admitting: Physical Therapy

## 2014-02-10 VITALS — BP 147/75 | HR 69 | Temp 98.1°F

## 2014-02-10 DIAGNOSIS — IMO0001 Reserved for inherently not codable concepts without codable children: Secondary | ICD-10-CM | POA: Diagnosis not present

## 2014-02-10 DIAGNOSIS — Z510 Encounter for antineoplastic radiation therapy: Secondary | ICD-10-CM | POA: Diagnosis not present

## 2014-02-10 DIAGNOSIS — M6281 Muscle weakness (generalized): Secondary | ICD-10-CM | POA: Diagnosis not present

## 2014-02-10 DIAGNOSIS — C3410 Malignant neoplasm of upper lobe, unspecified bronchus or lung: Secondary | ICD-10-CM

## 2014-02-10 DIAGNOSIS — R269 Unspecified abnormalities of gait and mobility: Secondary | ICD-10-CM | POA: Diagnosis not present

## 2014-02-10 NOTE — Progress Notes (Signed)
Weekly Management Note Current Dose:20 Gy  Projected Dose:50 Gy   Narrative:  The patient presents for routine under treatment assessment.  CBCT/MVCT images/Port film x-rays were reviewed.  The chart was checked. Doing well. No complaints. No dysphagia. Breathing is stable.   Physical Findings:  Alert. Finger bleeding from fall a few days ago.   Vitals:  Filed Vitals:   02/10/14 1457  BP: 147/75  Pulse: 69  Temp: 98.1 F (36.7 C)   Weight:  Wt Readings from Last 3 Encounters:  01/15/14 192 lb (87.091 kg)  12/23/13 192 lb (87.091 kg)  12/11/13 189 lb (85.73 kg)   Lab Results  Component Value Date   WBC 6.2 12/04/2013   HGB 10.9* 12/04/2013   HCT 33.8* 12/04/2013   MCV 95 12/04/2013   PLT 166 11/16/2013   Lab Results  Component Value Date   CREATININE 2.66* 12/04/2013   BUN 41* 12/04/2013   NA 138 12/04/2013   K 4.7 12/04/2013   CL 104 12/04/2013   CO2 18 12/04/2013     Impression:  The patient is tolerating radiation.  Plan:  Continue treatment as planned.

## 2014-02-10 NOTE — Progress Notes (Signed)
Weekly assessment of radiation to left lower lung.completed 4 of 10 treatments.Denies pain.Has shortness of breath on exertion, no cough.reviewed routine of clinic and side effects with patient and caretaker.

## 2014-02-10 NOTE — Addendum Note (Signed)
Encounter addended by: Rebecca Eaton, RN on: 02/10/2014  9:28 AM<BR>     Documentation filed: Charges VN

## 2014-02-11 ENCOUNTER — Ambulatory Visit
Admission: RE | Admit: 2014-02-11 | Discharge: 2014-02-11 | Disposition: A | Discharge status: Home / Self Care | Payer: Medicare Other | Source: Ambulatory Visit | Attending: Radiation Oncology | Admitting: Radiation Oncology

## 2014-02-11 DIAGNOSIS — Z510 Encounter for antineoplastic radiation therapy: Secondary | ICD-10-CM | POA: Diagnosis not present

## 2014-02-12 ENCOUNTER — Other Ambulatory Visit: Payer: Self-pay | Admitting: Internal Medicine

## 2014-02-12 ENCOUNTER — Ambulatory Visit
Admission: RE | Admit: 2014-02-12 | Discharge: 2014-02-12 | Disposition: A | Discharge status: Home / Self Care | Payer: Medicare Other | Source: Ambulatory Visit | Attending: Radiation Oncology | Admitting: Radiation Oncology

## 2014-02-12 DIAGNOSIS — Z510 Encounter for antineoplastic radiation therapy: Secondary | ICD-10-CM | POA: Diagnosis not present

## 2014-02-13 ENCOUNTER — Ambulatory Visit: Payer: Medicare Other

## 2014-02-13 ENCOUNTER — Encounter: Payer: Medicare Other | Admitting: Rehabilitative and Restorative Service Providers"

## 2014-02-17 ENCOUNTER — Other Ambulatory Visit: Payer: Self-pay | Admitting: *Deleted

## 2014-02-17 ENCOUNTER — Ambulatory Visit
Admission: RE | Admit: 2014-02-17 | Discharge: 2014-02-17 | Disposition: A | Discharge status: Home / Self Care | Payer: Medicare Other | Source: Ambulatory Visit | Attending: Radiation Oncology | Admitting: Radiation Oncology

## 2014-02-17 VITALS — BP 141/72 | HR 72 | Temp 97.9°F

## 2014-02-17 DIAGNOSIS — Z510 Encounter for antineoplastic radiation therapy: Secondary | ICD-10-CM | POA: Diagnosis not present

## 2014-02-17 DIAGNOSIS — C3410 Malignant neoplasm of upper lobe, unspecified bronchus or lung: Secondary | ICD-10-CM

## 2014-02-17 MED ORDER — ATORVASTATIN CALCIUM 20 MG PO TABS: ORAL_TABLET | ORAL | Status: DC

## 2014-02-17 MED ORDER — AMLODIPINE BESYLATE 5 MG PO TABS: ORAL_TABLET | ORAL | Status: AC

## 2014-02-17 NOTE — Telephone Encounter (Signed)
[facility] [address] 

## 2014-02-17 NOTE — Progress Notes (Signed)
Weekly Management Note Current Dose:  35 Gy  Projected Dose: 50 Gy   Narrative:  The patient presents for routine under treatment assessment.  CBCT/MVCT images/Port film x-rays were reviewed.  The chart was checked. Doing well. Non productive cough. Some redness over his mid back. Appt with PCP coming up.   Physical Findings: Slight pink over back.   Impression:  The patient is tolerating radiation.  Plan:  Continue treatment as planned. Start radiaplex.

## 2014-02-17 NOTE — Progress Notes (Signed)
Weekly assessment of radiation to left lower lung.Denies pain.Shortness of breath on exertion.Non-productive cough.Increases fatigue.Has small area of redness posterior back.Given radiaplex to  Apply twice daily.

## 2014-02-18 ENCOUNTER — Ambulatory Visit
Admission: RE | Admit: 2014-02-18 | Discharge: 2014-02-18 | Disposition: A | Discharge status: Home / Self Care | Payer: Medicare Other | Source: Ambulatory Visit | Attending: Radiation Oncology | Admitting: Radiation Oncology

## 2014-02-18 ENCOUNTER — Ambulatory Visit: Payer: Medicare Other

## 2014-02-18 DIAGNOSIS — Z510 Encounter for antineoplastic radiation therapy: Secondary | ICD-10-CM | POA: Diagnosis not present

## 2014-02-19 ENCOUNTER — Ambulatory Visit: Payer: Medicare Other

## 2014-02-19 ENCOUNTER — Ambulatory Visit
Admission: RE | Admit: 2014-02-19 | Discharge: 2014-02-19 | Disposition: A | Discharge status: Home / Self Care | Payer: Medicare Other | Source: Ambulatory Visit | Attending: Radiation Oncology | Admitting: Radiation Oncology

## 2014-02-19 DIAGNOSIS — Z510 Encounter for antineoplastic radiation therapy: Secondary | ICD-10-CM | POA: Diagnosis not present

## 2014-02-20 ENCOUNTER — Ambulatory Visit: Payer: Medicare Other

## 2014-02-20 ENCOUNTER — Encounter: Payer: Self-pay | Admitting: Radiation Oncology

## 2014-02-20 ENCOUNTER — Encounter: Payer: Medicare Other | Admitting: Rehabilitative and Restorative Service Providers"

## 2014-02-20 ENCOUNTER — Ambulatory Visit
Admission: RE | Admit: 2014-02-20 | Discharge: 2014-02-20 | Disposition: A | Discharge status: Home / Self Care | Payer: Medicare Other | Source: Ambulatory Visit | Attending: Radiation Oncology | Admitting: Radiation Oncology

## 2014-02-20 DIAGNOSIS — Z510 Encounter for antineoplastic radiation therapy: Secondary | ICD-10-CM | POA: Diagnosis not present

## 2014-02-23 ENCOUNTER — Telehealth: Payer: Self-pay | Admitting: Internal Medicine

## 2014-02-23 ENCOUNTER — Ambulatory Visit: Payer: Medicare Other

## 2014-02-23 ENCOUNTER — Ambulatory Visit: Payer: Medicare Other | Admitting: Physical Therapy

## 2014-02-23 DIAGNOSIS — IMO0001 Reserved for inherently not codable concepts without codable children: Secondary | ICD-10-CM | POA: Diagnosis not present

## 2014-02-23 NOTE — Telephone Encounter (Signed)
A [facility] form was dropped off on 02/23/2014 to be filled out by Dr. Mariea Clonts for [name]. Form was placed in [name]'s rx box.

## 2014-02-23 NOTE — Telephone Encounter (Signed)
"  Referred to [facility]), approved per Dr. Mariea Clonts.  cdavis

## 2014-02-24 ENCOUNTER — Ambulatory Visit: Payer: Medicare Other

## 2014-02-24 NOTE — Telephone Encounter (Signed)
Form placed in Dr. Arrie Senate to be completed.

## 2014-02-25 ENCOUNTER — Telehealth: Payer: Self-pay | Admitting: *Deleted

## 2014-02-25 ENCOUNTER — Ambulatory Visit: Payer: Medicare Other

## 2014-02-25 HISTORY — PX: CATARACT EXTRACTION: SUR2

## 2014-02-25 NOTE — Telephone Encounter (Signed)
Caregiver, Letta Median, dropped off a Handicapp Placard for Dr. Nyoka Cowden to sign. When signed Caregiver wants it mailed to [place], [place] [phone] Placed in Dr. Alvira Monday to sign

## 2014-02-26 ENCOUNTER — Other Ambulatory Visit: Payer: Self-pay | Admitting: Internal Medicine

## 2014-02-26 DIAGNOSIS — Z029 Encounter for administrative examinations, unspecified: Secondary | ICD-10-CM

## 2014-02-26 NOTE — Telephone Encounter (Signed)
Son ,Deidre Ala, called back and will pick up today. Copied and sent for scan and original left up front for pickup.

## 2014-02-26 NOTE — Telephone Encounter (Signed)
Forms completed by Dr. Mariea Clonts. $25.00 charge.  LMOM for patient to return call.

## 2014-02-26 NOTE — Progress Notes (Signed)
[name] aide and attendance form was completed.  He has been seeing me now for a few years.  Will again change his PCP to myself.

## 2014-03-06 ENCOUNTER — Other Ambulatory Visit: Payer: Self-pay | Admitting: Nurse Practitioner

## 2014-03-12 ENCOUNTER — Encounter: Payer: Self-pay | Admitting: Internal Medicine

## 2014-03-12 ENCOUNTER — Ambulatory Visit (INDEPENDENT_AMBULATORY_CARE_PROVIDER_SITE_OTHER): Payer: Medicare Other | Admitting: Internal Medicine

## 2014-03-12 VITALS — BP 138/66 | HR 63 | Temp 98.1°F | Resp 10 | Wt 191.0 lb

## 2014-03-12 DIAGNOSIS — Z23 Encounter for immunization: Secondary | ICD-10-CM

## 2014-03-12 DIAGNOSIS — R0602 Shortness of breath: Secondary | ICD-10-CM

## 2014-03-12 DIAGNOSIS — F03918 Unspecified dementia, unspecified severity, with other behavioral disturbance: Secondary | ICD-10-CM

## 2014-03-12 DIAGNOSIS — F0391 Unspecified dementia with behavioral disturbance: Secondary | ICD-10-CM

## 2014-03-12 DIAGNOSIS — J449 Chronic obstructive pulmonary disease, unspecified: Secondary | ICD-10-CM

## 2014-03-12 DIAGNOSIS — R635 Abnormal weight gain: Secondary | ICD-10-CM

## 2014-03-12 DIAGNOSIS — R918 Other nonspecific abnormal finding of lung field: Secondary | ICD-10-CM

## 2014-03-12 MED ORDER — ALBUTEROL SULFATE HFA 108 (90 BASE) MCG/ACT IN AERS: 2.0000 | INHALATION_SPRAY | Freq: Four times a day (QID) | RESPIRATORY_TRACT | Status: AC | PRN

## 2014-03-12 MED ORDER — DIVALPROEX SODIUM 125 MG PO CPSP: 125.0000 mg | ORAL_CAPSULE | Freq: Every day | ORAL | Status: DC

## 2014-03-12 NOTE — Progress Notes (Signed)
Patient ID: [name], male   DOB: May 16, 1928, 80 y.o.   MRN: [id number]   Location:  [facility]  Code Status: full code  Allergies  Allergen Reactions  . Diovan [Valsartan] Other (See Comments)    Unknown     Chief Complaint  Patient presents with  . Medical Management of Chronic Issues    3 month follow-up, labs completed in June 2015   . Shortness of Breath    Patient with increased SOB, discuss current breathing therapy    HPI: Patient is a 80 y.o. seen in the office today for   Has f/u on left eye.  Vision doing better.  No longer having eye pain.    Breathing is worse lately.  When walks bathroom to living room, he's sob to get to chair.  Has spiriva and nasal spray.    Has now gained 30 lbs.  Is no longer malnourished--had been after he'd been so sick and had required skilled nursing after his hospitalization.  His caregiver is feeding him regular meals and he gets a snack of ice cream nightly.  Apparently his sister was upset about the malnutrition diagnosis and blamed the caregiver for it.    Completed XRT 02/20/14 with [name].  Has f/u 10/15 for repeat imaging.    Review of Systems:  Review of Systems  Constitutional: Negative for fever.  HENT: Positive for hearing loss.   Eyes: Positive for blurred vision and redness. Negative for pain and discharge.       Improved  Respiratory: Positive for shortness of breath and wheezing. Negative for cough.   Cardiovascular: Negative for chest pain.  Gastrointestinal: Negative for abdominal pain and constipation.  Genitourinary: Negative for dysuria.  Musculoskeletal: Negative for falls.       Walks with walker  Skin: Negative for rash.  Neurological: Negative for dizziness, loss of consciousness, weakness and headaches.  Psychiatric/Behavioral: Positive for depression and memory loss.     Past Medical History  Diagnosis Date  . Arthritis   . Depression   .  Hypertension   . Hyperlipidemia   . Colon cancer   . AAA (abdominal aortic aneurysm) 2011  . CAD (coronary artery disease) 2003  . Anemia   . Diverticula of colon 2013  . Hypertrophy of prostate without urinary obstruction and other lower urinary tract symptoms (LUTS) 2008  . Hyperpotassemia 2008  . Gout, unspecified 2008  . Impacted cerumen 06/20/2006  . First degree atrioventricular block 2008  . Abnormality of gait 2007  . Other specified cardiac dysrhythmias(427.89) 2005  . Unspecified disorder of kidney and ureter 2005  . Edema 2005  . Malignant neoplasm of colon, unspecified site 2004  . Unspecified hereditary and idiopathic peripheral neuropathy 2004  . Chest pain, unspecified 2003  . Pain in joint, pelvic region and thigh 2004  . Pain in limb 2003  . Myocardial infarction 1984  . Exertional shortness of breath   . Pneumonia ~ 02/2013    "hospitalized for double pneumonia" (05/15/2013)  . Anxiety   . Chronic kidney disease     "born w/only 1 kidney" (05/15/2013)  . Chronic kidney disease (CKD), stage III (moderate)     [name] 05/14/2013 (05/15/2013)  . Postural dizziness     hospitalized 05/14/2013    Past Surgical History  Procedure Laterality Date  . Mediastinotomy    . Right colectomy  2004  . Inguinal hernia repair Right 1952    In  [place]  . Coronary artery bypass graft  1996    Bartle, MD  . Cardiac catheterization    . Colon surgery    . Lesion removal N/A 08/12/2013    Procedure: EXCISION FACIAL SKIN CANCER X 3    (MINOR PROCEDURE) ;  Surgeon: Douglas Nunnery, MD;  Location: [facility];  Service: ENT;  Laterality: N/A;  . Cataract extraction  02/04/14    Right eye  . Cataract extraction  02/25/14    Left eye     Social History:   reports that he quit smoking about 31 years ago. His smoking use included Cigarettes. He has a 35 pack-year smoking history. He has quit using smokeless tobacco. His smokeless tobacco use included Snuff. He reports  that he does not drink alcohol or use illicit drugs.  Family History  Problem Relation Age of Onset  . Heart disease Father   . Pancreatic cancer Sister   . Cancer Sister     liver  . Cancer Brother     Leukemia  . Cancer Mother     ?    Medications: Patient's Medications  New Prescriptions   No medications on file  Previous Medications   ALPRAZOLAM (XANAX) 0.25 MG TABLET    TAKE 1 TABLET TWICE A DAY AS NEEDED   AMLODIPINE (NORVASC) 5 MG TABLET    Take one tablet by mouth once daily   ATORVASTATIN (LIPITOR) 20 MG TABLET    Take one tablet by mouth once daily for cholesterol   CITALOPRAM (CELEXA) 20 MG TABLET    Take 1 tablet (20 mg total) by mouth daily.   [facility] ASPIRIN LOW DOSE 81 MG EC TABLET    TAKE 1 TABLET EVERY DAY   DIVALPROEX (DEPAKOTE SPRINKLE) 125 MG CAPSULE    Take 125 mg by mouth 2 (two) times daily.   DONEPEZIL (ARICEPT) 10 MG TABLET    Take 10 mg by mouth at bedtime.   FEEDING SUPPLEMENT, ENSURE COMPLETE, (ENSURE COMPLETE) LIQD    Take 237 mLs by mouth 2 (two) times daily between meals.   HYDRALAZINE (APRESOLINE) 25 MG TABLET    Take 75 mg by mouth 3 (three) times daily.   LISINOPRIL (PRINIVIL,ZESTRIL) 20 MG TABLET    TAKE 1 TABLET (20 MG TOTAL) BY MOUTH DAILY.   MECLIZINE (ANTIVERT) 12.5 MG TABLET    Take 2.5 tablets (31.25 mg total) by mouth 3 (three) times daily as needed for dizziness.   MEMANTINE (NAMENDA) 10 MG TABLET    Take 10 mg by mouth 2 (two) times daily.   MIRTAZAPINE (REMERON) 15 MG TABLET    Take 7.5 mg by mouth at bedtime.   TIOTROPIUM BROMIDE MONOHYDRATE (SPIRIVA RESPIMAT) 2.5 MCG/ACT AERS    Inhale 1 puff into the lungs daily.  Modified Medications   No medications on file  Discontinued Medications   No medications on file     Physical Exam: Filed Vitals:   03/12/14 1540  BP: 138/66  Pulse: 63  Temp: 98.1 F (36.7 C)  TempSrc: Oral  Resp: 10  Weight: 191 lb (86.637 kg)  SpO2: 96%  Physical Exam  Constitutional: He appears  well-developed and well-nourished. No distress.  HENT:  Head: Normocephalic and atraumatic.  Cardiovascular: Normal rate, regular rhythm, normal heart sounds and intact distal pulses.   Pulmonary/Chest: Effort normal and breath sounds normal. No respiratory distress.  Abdominal: Soft. Bowel sounds are normal. He exhibits no distension and no mass. There is no tenderness.  Musculoskeletal: Normal range of motion.  ambulates with stooped posture with rollator walker  Neurological: He is alert.  Skin: Skin is warm and dry.  Easy bruising  Psychiatric: He has a normal mood and affect.     Labs reviewed: Basic Metabolic Panel:  Recent Labs  05/15/13 1050  11/15/13 0445 11/16/13 0320 12/04/13 0923  NA  --   < > 145 141 138  K  --   < > 5.5* 5.7* 4.7  CL  --   < > 110 109 104  CO2  --   < > 24 21 18   GLUCOSE  --   < > 82 107* 107*  BUN  --   < > 35* 39* 41*  CREATININE  --   < > 2.10* 2.16* 2.66*  CALCIUM  --   < > 8.8 8.8 8.8  TSH 2.041  --   --   --   --   < > = values in this interval not displayed. Liver Function Tests:  Recent Labs  05/15/13 0318 11/14/13 1557 11/15/13 0445  AST 19 17 16   ALT 12 11 10   ALKPHOS 64 76 67  BILITOT 0.2* 0.2* 0.2*  PROT 5.8* 6.9 6.3  ALBUMIN 3.1* 3.4* 3.1*    Recent Labs  11/14/13 1557  LIPASE 85*   No results found for this basename: AMMONIA,  in the last 8760 hours CBC:  Recent Labs  11/14/13 1557 11/15/13 0445 11/16/13 0320 12/04/13 0923  WBC 8.2 5.3 4.3 6.2  NEUTROABS 6.2  --   --  4.2  HGB 10.7* 9.9* 9.9* 10.9*  HCT 34.1* 31.6* 31.0* 33.8*  MCV 97.7 97.5 96.3 95  PLT 180 159 166  --    Lipid Panel:  Recent Labs  12/04/13 0923  HDL 33*  LDLCALC 62  TRIG 92  CHOLHDL 3.4   Lab Results  Component Value Date   HGBA1C 5.7* 12/04/2013    Assessment/Plan 1. Pulmonary nodules/lesions, multiple -keep f/u with [name] and imaging upcoming  2. Dementia with behavioral disturbance -stop xanax (rare use)  and decrease depakote sprinkles to evening only due to sleepiness   3. Shortness of breath -unchanged, but albuterol may help him  4. Weight gain -nutritional status has improved dramatically since he's been home from snf and had regular meals by his caregiver and his mental state has gotten better  5. Chronic obstructive pulmonary disease, unspecified COPD, unspecified chronic bronchitis type -given Rx for albuterol inhaler  6.  Need for influenza vaccine -flu shot given  Labs/tests ordered:  No new Next appt:  3 mos  Nettye Flegal L. Antha Niday, D.O. [facility] [address], [place] [address] Cell Phone (Mon-Fri 8am-5pm):  [phone] On Call:  [phone] & follow prompts after 5pm & weekends Office Phone:  [phone] Office Fax:  [phone]

## 2014-03-13 ENCOUNTER — Ambulatory Visit: Payer: Medicare Other | Admitting: Internal Medicine

## 2014-03-23 NOTE — Progress Notes (Unsigned)
  Radiation Oncology         (336) [phone] ________________________________  Name: ROHITH FAUTH MRN: [id number]  Date: 02/20/2014  DOB: 1927/09/17  End of Treatment Note  Diagnosis:   Metastatic colon cancer to lung     Indication for treatment:  Palliative       Radiation treatment dates:   8/26-9/11   Site/dose:   Left lower lobe to 50 gy at 5 gy per fraction x 10 fractions  Beams/energy:   3D conformal treatment was used to deliver the dose prescribed to the 90% IDL with 6 and 10 MV photons and daily CBCT for image guidance.   Narrative: The patient tolerated radiation treatment relatively well.   He had no ill effects from treatment.   Plan: The patient has completed radiation treatment. The patient will return to radiation oncology clinic for routine followup in one month. I advised them to call or return sooner if they have any questions or concerns related to their recovery or treatment.  ------------------------------------------------  Thea Silversmith, MD

## 2014-03-24 ENCOUNTER — Ambulatory Visit: Payer: Medicare Other | Admitting: Emergency Medicine

## 2014-03-26 ENCOUNTER — Ambulatory Visit
Admission: RE | Admit: 2014-03-26 | Discharge: 2014-03-26 | Disposition: A | Discharge status: Home / Self Care | Payer: Medicare Other | Source: Ambulatory Visit | Attending: Radiation Oncology | Admitting: Radiation Oncology

## 2014-03-26 DIAGNOSIS — C189 Malignant neoplasm of colon, unspecified: Secondary | ICD-10-CM

## 2014-03-27 ENCOUNTER — Ambulatory Visit (INDEPENDENT_AMBULATORY_CARE_PROVIDER_SITE_OTHER): Payer: Medicare Other | Admitting: Emergency Medicine

## 2014-03-27 ENCOUNTER — Encounter: Payer: Self-pay | Admitting: Emergency Medicine

## 2014-03-27 ENCOUNTER — Telehealth: Payer: Self-pay | Admitting: *Deleted

## 2014-03-27 VITALS — BP 138/72 | HR 60 | Temp 98.5°F | Ht 72.0 in | Wt 192.2 lb

## 2014-03-27 DIAGNOSIS — J449 Chronic obstructive pulmonary disease, unspecified: Secondary | ICD-10-CM

## 2014-03-27 DIAGNOSIS — R918 Other nonspecific abnormal finding of lung field: Secondary | ICD-10-CM

## 2014-03-27 MED ORDER — TIOTROPIUM BROMIDE MONOHYDRATE 18 MCG IN CAPS: 18.0000 ug | ORAL_CAPSULE | Freq: Every day | RESPIRATORY_TRACT | Status: DC

## 2014-03-27 NOTE — Progress Notes (Signed)
Subjective:    Patient ID: [name], male    DOB: 1927-11-13, 80 y.o.   MRN: [id number]  HPI 80 yo man, former smoker, hx of dementia, CAD, AAA, HTN, recent admission for symptomatic bradycardia. Part of his evaluation included a CT scan of the chest performed on 05/16/13 that identified to slowly enlarging left upper lobe groundglass nodules. Also seen was a more solid nodule in the left lower lobe which was slightly enlarged compared with his study on 03/06/13. Bilateral lower lobe infiltrates that were seen on his scan from 9/25 were resolved on the new study. He is referred for evaluation of his pulmonary nodules.    ROV 11/26/13 -- follows up to review repeat CT scan of the chest that had shown a slowly enlarging left upper lobe nodule. He is having more dyspnea over the last three months. The nodule has tripled in size since   ROV 12/23/13 -- follows for COPD and dyspnea, as well as an enlarging LLL sup seg nodule and other LUL sub-solid nodules. Last time we started spiriva respimat to see if he would clinically respond > he tells me that his breathing is better, family agrees. He expresses interest in trying to get XRT  ROV 03/27/14 -- follow up visit for COPD, pulm nodules that we suspect are malignancy. She has been following with Dr [name] for XRT, received 10 treatments. He is set for CT scan 04/13/14.  He has Spiriva, has just started albuterol prn.    Review of Systems  Constitutional: Negative for fever and unexpected weight change.  HENT: Positive for postnasal drip. Negative for congestion, dental problem, ear pain, nosebleeds, rhinorrhea, sinus pressure, sneezing, sore throat and trouble swallowing.   Eyes: Positive for redness. Negative for itching.  Respiratory: Positive for cough. Negative for chest tightness, shortness of breath and wheezing.   Cardiovascular: Negative for palpitations and leg swelling.  Gastrointestinal: Negative for nausea and vomiting.   Genitourinary: Negative for dysuria.  Musculoskeletal: Negative for joint swelling.  Skin: Negative for rash.  Neurological: Negative for headaches.  Hematological: Bruises/bleeds easily.  Psychiatric/Behavioral: Positive for dysphoric mood. The patient is nervous/anxious.    Past Medical History  Diagnosis Date  . Arthritis   . Depression   . Hypertension   . Hyperlipidemia   . Colon cancer   . AAA (abdominal aortic aneurysm) 2011  . CAD (coronary artery disease) 2003  . Anemia   . Diverticula of colon 2013  . Hypertrophy of prostate without urinary obstruction and other lower urinary tract symptoms (LUTS) 2008  . Hyperpotassemia 2008  . Gout, unspecified 2008  . Impacted cerumen 06/20/2006  . First degree atrioventricular block 2008  . Abnormality of gait 2007  . Other specified cardiac dysrhythmias(427.89) 2005  . Unspecified disorder of kidney and ureter 2005  . Edema 2005  . Malignant neoplasm of colon, unspecified site 2004  . Unspecified hereditary and idiopathic peripheral neuropathy 2004  . Chest pain, unspecified 2003  . Pain in joint, pelvic region and thigh 2004  . Pain in limb 2003  . Myocardial infarction 1984  . Exertional shortness of breath   . Pneumonia ~ 02/2013    "hospitalized for double pneumonia" (05/15/2013)  . Anxiety   . Chronic kidney disease     "born w/only 1 kidney" (05/15/2013)  . Chronic kidney disease (CKD), stage III (moderate)     [name] 05/14/2013 (05/15/2013)  . Postural dizziness     hospitalized 05/14/2013     Family  History  Problem Relation Age of Onset  . Heart disease Father   . Pancreatic cancer Sister   . Cancer Sister     liver  . Cancer Brother     Leukemia  . Cancer Mother     ?     History   Social History  . Marital Status: Single    Spouse Name: N/A    Number of Children: 0  . Years of Education: N/A   Occupational History  .     Social History Main Topics  . Smoking status: Former Smoker -- 1.00  packs/day for 35 years    Types: Cigarettes    Quit date: 06/12/1982  . Smokeless tobacco: Former Systems developer    Types: Snuff  . Alcohol Use: No  . Drug Use: No  . Sexual Activity: No   Other Topics Concern  . Not on file   Social History Narrative  . No narrative on file     Allergies  Allergen Reactions  . Diovan [Valsartan] Other (See Comments)    Unknown      Outpatient Prescriptions Prior to Visit  Medication Sig Dispense Refill  . albuterol (PROVENTIL HFA;VENTOLIN HFA) 108 (90 BASE) MCG/ACT inhaler Inhale 2 puffs into the lungs every 6 (six) hours as needed for wheezing or shortness of breath.  1 Inhaler  5  . ALPRAZolam (XANAX) 0.25 MG tablet Take 0.25 mg by mouth 2 (two) times daily as needed for anxiety.      Marland Kitchen amLODipine (NORVASC) 5 MG tablet Take one tablet by mouth once daily  90 tablet  3  . atorvastatin (LIPITOR) 20 MG tablet Take one tablet by mouth once daily for cholesterol  90 tablet  3  . citalopram (CELEXA) 20 MG tablet Take 1 tablet (20 mg total) by mouth daily.  90 tablet  3  . [employer]S ASPIRIN LOW DOSE 81 MG EC tablet TAKE 1 TABLET EVERY DAY  30 tablet  5  . divalproex (DEPAKOTE SPRINKLE) 125 MG capsule Take 1 capsule (125 mg total) by mouth daily after supper.  30 capsule  3  . donepezil (ARICEPT) 10 MG tablet Take 10 mg by mouth at bedtime.      . feeding supplement, ENSURE COMPLETE, (ENSURE COMPLETE) LIQD Take 237 mLs by mouth 2 (two) times daily between meals.  30 Bottle  5  . hydrALAZINE (APRESOLINE) 25 MG tablet Take 75 mg by mouth 3 (three) times daily.      Marland Kitchen lisinopril (PRINIVIL,ZESTRIL) 20 MG tablet TAKE 1 TABLET (20 MG TOTAL) BY MOUTH DAILY.  90 tablet  1  . meclizine (ANTIVERT) 12.5 MG tablet Take 2.5 tablets (31.25 mg total) by mouth 3 (three) times daily as needed for dizziness.  30 tablet  0  . memantine (NAMENDA) 10 MG tablet Take 10 mg by mouth 2 (two) times daily.      . mirtazapine (REMERON) 15 MG tablet Take 7.5 mg by mouth at bedtime.      .  Tiotropium Bromide Monohydrate (SPIRIVA RESPIMAT) 2.5 MCG/ACT AERS Inhale 1 puff into the lungs daily.  1 Inhaler  6   No facility-administered medications prior to visit.         Objective:   Physical Exam Filed Vitals:   03/27/14 1610  BP: 138/72  Pulse: 60  Temp: 98.5 F (36.9 C)  TempSrc: Oral  Height: 6' (1.829 m)  Weight: 192 lb 3.2 oz (87.181 kg)  SpO2: 95%   Gen: Quiet, somewhat frail man, in  no distress,  normal affect  ENT: No lesions,  mouth clear,  oropharynx clear, no postnasal drip  Neck: No JVD, no TMG, no carotid bruits  Lungs: No use of accessory muscles, clear without rales or rhonchi  Cardiovascular: RRR, heart sounds normal, no murmur or gallops, no peripheral edema  Musculoskeletal: No deformities, no cyanosis or clubbing  Neuro: alert, non focal  Skin: Warm, no lesions or rashes  11/24/13 --  COMPARISON: Chest CT 05/16/2013.  FINDINGS:  Mediastinum: Heart size is normal. There is no significant  pericardial fluid, thickening or pericardial calcification. There is  atherosclerosis of the thoracic aorta, the great vessels of the  mediastinum and the coronary arteries, including calcified  atherosclerotic plaque in the left main, left anterior descending,  left circumflex and right coronary arteries. Status post median  sternotomy for CABG, including LIMA to the LAD. No pathologically  enlarged mediastinal or hilar lymph nodes. Please note that accurate  exclusion of hilar adenopathy is limited on noncontrast CT scans.  Esophagus is unremarkable in appearance.  Lungs/Pleura: Significant interval enlargement of what is now a 2.1  x 2.5 cm macrolobulated pulmonary nodule in the superior segment of  the left lower lobe (image 30 of series 3), which previously  measured only 1.1 cm. This nodule is cephalad and immediately  adjacent to a subsegmental bronchus off the bronchus to the superior  segment of the left lower lobe. Other previously noted  left lower  lobe nodule appears slightly smaller, subpleural in location  adjacent to the the left major fissure (image 41 of series 3)  measuring 7 mm on today's examination. Two ground-glass attenuation  nodules are again noted in the left upper lobe measuring 2.0 x 1.4  cm (image 19 of series 3), and 1.8 x 1.4 cm (image 23 of series 3)  with a small 3 mm central solid component (image 24 of series 2),  similar to the prior examination. Other smaller more ill-defined  areas of ground-glass attenuation are also noted scattered  throughout the right lung, similar to the prior examination. Areas  of nodular pleuroparenchymal thickening are also noted in the apices  of the lungs bilaterally, presumably chronic post infectious or  inflammatory scarring. Small area of scarring in the inferior  segment of the lingula is unchanged. No acute consolidative airspace  disease. No pleural effusions.  Upper Abdomen: Unremarkable.  Musculoskeletal: Multiple old healed left-sided rib fractures. There  are no aggressive appearing lytic or blastic lesions noted in the  visualized portions of the skeleton. Median sternotomy wires.  IMPRESSION:  1. Marked interval enlargement of a 2.1 x 2.5 cm macrolobulated  nodule in the superior segment of the left lower lobe, highly  suspicious for a primary bronchogenic neoplasm. Correlation with  PET-CT or biopsy is suggested for further diagnostic and staging  purposes. No definite mediastinal or hilar lymphadenopathy is  appreciated at this time on today's non contrast CT examination.  2. 2 sub solid nodules in the left upper lobe appear similar to the  prior examination, as detailed above. Continued attention on future  followup studies is recommended.  3. Atherosclerosis, including left main and 3 vessel coronary artery  disease. Status post median sternotomy for CABG, including LIMA to  the LAD.       Assessment & Plan:  COPD mixed type Exertional SOB  without hypoxemia. Will continue spiriva + albuterol prn. If his SABA use is high then will consider adding LABA. ROV 4  Pulmonary nodules Presumed  malignancy. He has undergone XRT, will follow with Dr [name] with a CT chest in November.

## 2014-03-27 NOTE — Assessment & Plan Note (Signed)
Presumed malignancy. He has undergone XRT, will follow with Dr Pablo Ledger with a CT chest in November.

## 2014-03-27 NOTE — Addendum Note (Signed)
Addended by: Mathis Dad on: 03/27/2014 04:55 PM   Modules accepted: Orders

## 2014-03-27 NOTE — Patient Instructions (Signed)
Please continue your Spiriva once a day Use albuterol 2 puffs as needed for shortness of breath Get your Ct scan in November as planned.  Follow with Dr Lamonte Sakai in 4 months or sooner if you have any problems.

## 2014-03-27 NOTE — Progress Notes (Signed)
   Department of Radiation Oncology  Phone:  [phone] Fax:        [phone]   Name: [name] MRN: [id number]  DOB: June 25, 1927  Date: 03/26/2014  Follow Up Visit Note  Diagnosis:    ICD-9-CM ICD-10-CM  1. Colon cancer 153.9 C18.9   Summary and Interval since last radiation: 50 Gy in 10 fractions completed 02/20/14  Interval History: [name] presents today for routine followup.  He has done well after treatment. His breathing is stable. He does not remember coming in for treatment.  He is accompanied by a caregiver. He has an appointment tomorrow with Dr. Lamonte Sakai.   Physical Exam: No respiratory distress.   IMPRESSION: [name] is a 80 y.o. male s/p SBRT for metastatic colon cancer to lung  PLAN:  He is doing well. He seems to have significant dementia.  I offered to scan him every 6 months or just if/when any symptoms appear. His caregiver elected for every 6 months so I will order a scan for the next few weeks as baseline and then 6 months from now with a follow up with me. He can always call me with any questions.     Thea Silversmith, MD

## 2014-03-27 NOTE — Assessment & Plan Note (Signed)
Exertional SOB without hypoxemia. Will continue spiriva + albuterol prn. If his SABA use is high then will consider adding LABA. ROV 4

## 2014-03-27 NOTE — Telephone Encounter (Signed)
CALLED PATIENT TO INFORM OF CTS AND FU, SPOKE WITH [name] AND SHE IS AWARE OF THESE APPTS.

## 2014-03-28 ENCOUNTER — Other Ambulatory Visit: Payer: Self-pay | Admitting: Internal Medicine

## 2014-04-12 DIAGNOSIS — Y92009 Unspecified place in unspecified non-institutional (private) residence as the place of occurrence of the external cause: Secondary | ICD-10-CM

## 2014-04-12 DIAGNOSIS — W19XXXA Unspecified fall, initial encounter: Secondary | ICD-10-CM

## 2014-04-12 HISTORY — DX: Unspecified place in unspecified non-institutional (private) residence as the place of occurrence of the external cause: Y92.009

## 2014-04-12 HISTORY — DX: Unspecified fall, initial encounter: W19.XXXA

## 2014-04-13 ENCOUNTER — Ambulatory Visit (HOSPITAL_COMMUNITY)
Admission: RE | Admit: 2014-04-13 | Discharge: 2014-04-13 | Disposition: A | Discharge status: Home / Self Care | Payer: Medicare Other | Source: Ambulatory Visit | Attending: Diagnostic Radiology | Admitting: Diagnostic Radiology

## 2014-04-13 DIAGNOSIS — C7800 Secondary malignant neoplasm of unspecified lung: Secondary | ICD-10-CM | POA: Insufficient documentation

## 2014-04-13 DIAGNOSIS — Z923 Personal history of irradiation: Secondary | ICD-10-CM | POA: Diagnosis not present

## 2014-04-13 DIAGNOSIS — Z9221 Personal history of antineoplastic chemotherapy: Secondary | ICD-10-CM | POA: Diagnosis not present

## 2014-04-13 DIAGNOSIS — C189 Malignant neoplasm of colon, unspecified: Secondary | ICD-10-CM | POA: Diagnosis not present

## 2014-04-24 ENCOUNTER — Other Ambulatory Visit: Payer: Self-pay | Admitting: Internal Medicine

## 2014-04-30 ENCOUNTER — Other Ambulatory Visit: Payer: Self-pay | Admitting: Dermatology

## 2014-05-15 ENCOUNTER — Telehealth: Payer: Self-pay | Admitting: *Deleted

## 2014-05-15 NOTE — Telephone Encounter (Signed)
He needs to be seen

## 2014-05-15 NOTE — Telephone Encounter (Signed)
Letta Median, Caregiver called and stated that the Albuterol given is not helping patient's breathing. His breathing is getting worse and he will take 2 puffs and it is not doing anything for him. Can it be changed to something else or does he need to be seen. Please Advise.

## 2014-05-15 NOTE — Telephone Encounter (Signed)
Appointment scheduled with Dr. Nyoka Cowden for Tuesday. Patient could not come in Monday with Dr. Mariea Clonts due to a Procedure he was having.

## 2014-05-18 HISTORY — PX: EYELID LACERATION REPAIR: SHX1564

## 2014-05-19 ENCOUNTER — Ambulatory Visit (INDEPENDENT_AMBULATORY_CARE_PROVIDER_SITE_OTHER): Payer: Medicare Other | Admitting: Internal Medicine

## 2014-05-19 ENCOUNTER — Encounter: Payer: Self-pay | Admitting: Internal Medicine

## 2014-05-19 VITALS — BP 132/78 | HR 63 | Temp 98.1°F | Resp 10 | Ht 72.0 in | Wt 186.0 lb

## 2014-05-19 DIAGNOSIS — R0600 Dyspnea, unspecified: Secondary | ICD-10-CM

## 2014-05-19 DIAGNOSIS — E1142 Type 2 diabetes mellitus with diabetic polyneuropathy: Secondary | ICD-10-CM

## 2014-05-19 DIAGNOSIS — D649 Anemia, unspecified: Secondary | ICD-10-CM

## 2014-05-19 DIAGNOSIS — R269 Unspecified abnormalities of gait and mobility: Secondary | ICD-10-CM

## 2014-05-19 DIAGNOSIS — F03918 Unspecified dementia, unspecified severity, with other behavioral disturbance: Secondary | ICD-10-CM

## 2014-05-19 DIAGNOSIS — I10 Essential (primary) hypertension: Secondary | ICD-10-CM

## 2014-05-19 DIAGNOSIS — C189 Malignant neoplasm of colon, unspecified: Secondary | ICD-10-CM

## 2014-05-19 DIAGNOSIS — E785 Hyperlipidemia, unspecified: Secondary | ICD-10-CM

## 2014-05-19 DIAGNOSIS — G629 Polyneuropathy, unspecified: Secondary | ICD-10-CM

## 2014-05-19 DIAGNOSIS — F0391 Unspecified dementia with behavioral disturbance: Secondary | ICD-10-CM

## 2014-05-19 MED ORDER — FLUTICASONE FUROATE-VILANTEROL 100-25 MCG/INH IN AEPB: INHALATION_SPRAY | RESPIRATORY_TRACT | Status: AC

## 2014-05-19 NOTE — Progress Notes (Signed)
Patient ID: [name], male   DOB: January 26, 1928, 80 y.o.   MRN: [id number]    Facility  [facility]    Place of Service:   OFFICE   Allergies  Allergen Reactions  . Diovan [Valsartan] Other (See Comments)    Unknown     Chief Complaint  Patient presents with  . Acute Visit    Inhaler not working     HPI:  Patient has not been seen by me since August 2014. Much has happened to him since he last saw me.  Hospitalized 03/04/13 through 03/10/13: Acute respiratory failure with hypoxia, Large right-sided infiltrate, hypertension, CK D, diabetes mellitus.  Admitted to [facility] skilled nursing facility and followed by Dr. Sheppard Coil.  Readmitted to the hospital 05/14/13 through 05/17/13 for symptomatic bradycardia. Additional diagnoses included peripheral neuropathy, abdominal aneurysm, dementia, dizziness, severe protein calorie malnutrition, and thyroid nodules. There was a possible lung mass as well.  Following discharge from the hospital he saw Dr. Annamarie Major regarding the abdominal aneurysm. Dr. Trula Slade felt that the risk of surgery outweighed the potential benefit.  He received continuing follow-up of his pulmonary nodules through Dr. Baltazar Apo. On 05/27/13 Dr. Lamonte Sakai recommended a supple follow-up CT scan of the chest in 6 months to look for interval change. Did not feel that the patient's medical status would allow him to withstand either chemotherapy or radiation therapy in December 2014.  Patient was admitted to the hospital again on 11/15/13 for abdominal pain. He also appeared to have accelerated hypertension and symptomatic bradycardia and a urinary tract infection.  12/23/13 Dr. Lamonte Sakai became convinced the patient has a malignancy in the lungs he discussed the situation with Dr. Thea Silversmith, radiotherapist who was willing to consider x-ray therapy without a tissue diagnosis. He has completed this therapy now.  Dyspneic much of the time. Albuterol does not seem to  help.  Urinary incontinence and urgency.  Very unstable gait and at high risk for falling.  Dementia seems reasonably stable since I last saw the patient. He remains moody and confused much of the time.  Medications: Patient's Medications  New Prescriptions   No medications on file  Previous Medications   ALBUTEROL (PROVENTIL HFA;VENTOLIN HFA) 108 (90 BASE) MCG/ACT INHALER    Inhale 2 puffs into the lungs every 6 (six) hours as needed for wheezing or shortness of breath.   ALPRAZOLAM (XANAX) 0.25 MG TABLET    Take 0.25 mg by mouth 2 (two) times daily as needed for anxiety.   AMLODIPINE (NORVASC) 5 MG TABLET    Take one tablet by mouth once daily   ATORVASTATIN (LIPITOR) 20 MG TABLET    Take one tablet by mouth once daily for cholesterol   CITALOPRAM (CELEXA) 20 MG TABLET    Take 1 tablet (20 mg total) by mouth daily.   [employer] ASPIRIN EC 81 MG EC TABLET    TAKE 1 TABLET EVERY DAY   DIVALPROEX (DEPAKOTE SPRINKLE) 125 MG CAPSULE    Take 1 capsule (125 mg total) by mouth daily after supper.   DONEPEZIL (ARICEPT) 10 MG TABLET    Take 10 mg by mouth at bedtime.   FEEDING SUPPLEMENT, ENSURE COMPLETE, (ENSURE COMPLETE) LIQD    Take 237 mLs by mouth 2 (two) times daily between meals.   HYDRALAZINE (APRESOLINE) 25 MG TABLET    Take 75 mg by mouth 3 (three) times daily.   MEMANTINE (NAMENDA) 10 MG TABLET    Take 10 mg by mouth 2 (two) times daily.  MIRTAZAPINE (REMERON) 15 MG TABLET    TAKE 1/2 TABLET BY MOUTH EVERY NIGHT   TIOTROPIUM (SPIRIVA) 18 MCG INHALATION CAPSULE    Place 1 capsule (18 mcg total) into inhaler and inhale daily.  Modified Medications   No medications on file  Discontinued Medications   LISINOPRIL (PRINIVIL,ZESTRIL) 20 MG TABLET    TAKE 1 TABLET (20 MG TOTAL) BY MOUTH DAILY.   MECLIZINE (ANTIVERT) 12.5 MG TABLET    Take 2.5 tablets (31.25 mg total) by mouth 3 (three) times daily as needed for dizziness.   MIRTAZAPINE (REMERON) 15 MG TABLET    Take 7.5 mg by mouth at bedtime.    TIOTROPIUM BROMIDE MONOHYDRATE (SPIRIVA RESPIMAT) 2.5 MCG/ACT AERS    Inhale 1 puff into the lungs daily.     Review of Systems  Constitutional: Positive for activity change.  HENT: Negative.   Eyes: Negative.   Respiratory: Negative.   Cardiovascular: Negative for chest pain, palpitations and leg swelling.  Endocrine: Negative.   Musculoskeletal: Positive for back pain, arthralgias and gait problem.       Very unsteady. Using cane.  Skin: Negative.   Neurological:       Memory . Peripheral neuropathy.  Hematological: Negative.   Psychiatric/Behavioral: Positive for dysphoric mood.    Filed Vitals:   05/19/14 1505  BP: 132/78  Pulse: 63  Temp: 98.1 F (36.7 C)  TempSrc: Oral  Resp: 10  Height: 6' (1.829 m)  Weight: 186 lb (84.369 kg)  SpO2: 96%   Body mass index is 25.22 kg/(m^2).  Physical Exam  Constitutional: He appears well-developed and well-nourished. No distress.  HENT:  Head: Normocephalic and atraumatic.  Cardiovascular: Normal rate, regular rhythm, normal heart sounds and intact distal pulses.   Pulmonary/Chest: Effort normal and breath sounds normal. No respiratory distress.  Abdominal: Soft. Bowel sounds are normal. He exhibits no distension and no mass. There is no tenderness.  Musculoskeletal: Normal range of motion.  ambulates with stooped posture with rollator walker. Has bilateral AFO.  Neurological: He is alert.  Skin: Skin is warm and dry.  Easy bruising  Psychiatric: He has a normal mood and affect.     Labs reviewed: No visits with results within 3 Month(s) from this visit. Latest known visit with results is:  Hospital Outpatient Visit on 01/22/2014  Component Date Value Ref Range Status  . Glucose-Capillary 01/22/2014 128* 70 - 99 mg/dL Final   Ct Chest Wo Contrast  04/13/2014   CLINICAL DATA:  History colon cancer with lung metastasis. Status post chemotherapy and radiation therapy. Weakness and shortness of breath.Colon cancer C18.9  (ICD-10-CM)  EXAM: CT CHEST WITHOUT CONTRAST  TECHNIQUE: Multidetector CT imaging of the chest was performed following the standard protocol without IV contrast.  COMPARISON:  PET 01/22/2014.  Chest CT 11/25/2011  FINDINGS: Lungs/Pleura: Minimal exclusion of the lung apices. Biapical pleural parenchymal scarring.  Scattered areas of probable subsegmental atelectasis in the dependent right upper and right lower lobes.  Sub solid pulmonary nodule on the left upper lobe measures 1.7 x 1.5 cm on image 15 and is felt to be similar to 1.7 x 1.4 cm on the prior PET. Presumed radiation change in the anterior medial left upper lobe on image 25. This is new.  Area of left major fissure thickening/ nodularity is chronic, including at 5 mm on image 36.  Spiculated posterior left upper lobe nodule measures 1.0 x 0.9 cm on image 19. Compare 1.4 x 1.1 cm on the prior exam. There  is mild surrounding ground-glass opacity which is likely radiation induced.  Superior segment left lower lobe pulmonary nodule is diminished in size. 1.7 by 1.2 cm on image 25 of series 5 today. 3.4 x 2.9 cm on the prior PET. No enlarging nodules.  No pleural fluid.  Heart/Mediastinum: Similar nonspecific right sided thyroid nodule at 1.7 cm. Aortic and branch vessel atherosclerosis. Prior median sternotomy. Tortuous descending thoracic aorta. Normal heart size, without pericardial effusion. No mediastinal or definite hilar adenopathy, given limitations of unenhanced CT. Tiny hiatal hernia.  Upper Abdomen: Normal imaged portions of the liver, spleen, pancreas, gallbladder, adrenal glands, right kidney. Surgical changes in the region of the hepatic flexure colon. Incompletely imaged aortic dilatation.  Bones/Musculoskeletal:  Remote bilateral rib trauma.  IMPRESSION: 1. Response to therapy of superior segment left lower lobe and posterior left upper lobe pulmonary nodules. The more anterior left upper lobe sub solid nodule is not significantly changed  compared to the 01/22/2014 PET. 2. No new or progressive disease.   Electronically Signed   By: Abigail Miyamoto M.D.   On: 04/13/2014 14:01     Assessment/Plan  1. Abnormality of gait High risk for falls. Using cane at this time.  2. Dyspnea - Fluticasone Furoate-Vilanterol (BREO ELLIPTA) 100-25 MCG/INH AEPB; Inhale into the lungs once daily to help breathing  Dispense: 1 each; Refill: 4 - Continue use of nebulized medications at this does not help.  3. Colon cancer - CBC With differential/Platelet; Future  4. Dementia with behavioral disturbance Unchanged  5. DM type 2 with diabetic peripheral neuropathy Unchanged - Hemoglobin A1c; Future - Comprehensive metabolic panel; Future - Microalbumin, urine; Future  6. Hyperlipidemia Unchanged - Lipid panel; Future  7. Essential hypertension Controlled  8. Anemia, unspecified anemia type Follow-up lab -CBC

## 2014-05-25 ENCOUNTER — Ambulatory Visit: Payer: Medicare Other | Admitting: Family

## 2014-05-25 ENCOUNTER — Other Ambulatory Visit (HOSPITAL_COMMUNITY): Payer: Medicare Other

## 2014-06-02 ENCOUNTER — Encounter: Payer: Self-pay | Admitting: Family

## 2014-06-03 ENCOUNTER — Other Ambulatory Visit (HOSPITAL_COMMUNITY): Payer: Medicare Other

## 2014-06-03 ENCOUNTER — Ambulatory Visit: Payer: Medicare Other | Admitting: Family

## 2014-06-15 ENCOUNTER — Ambulatory Visit (INDEPENDENT_AMBULATORY_CARE_PROVIDER_SITE_OTHER): Payer: Medicare Other | Admitting: Internal Medicine

## 2014-06-15 ENCOUNTER — Encounter: Payer: Self-pay | Admitting: Internal Medicine

## 2014-06-15 VITALS — BP 138/70 | HR 73 | Temp 98.1°F | Resp 18 | Ht 72.0 in | Wt 184.8 lb

## 2014-06-15 DIAGNOSIS — J449 Chronic obstructive pulmonary disease, unspecified: Secondary | ICD-10-CM

## 2014-06-15 DIAGNOSIS — H8110 Benign paroxysmal vertigo, unspecified ear: Secondary | ICD-10-CM

## 2014-06-15 DIAGNOSIS — G629 Polyneuropathy, unspecified: Secondary | ICD-10-CM

## 2014-06-15 DIAGNOSIS — E1142 Type 2 diabetes mellitus with diabetic polyneuropathy: Secondary | ICD-10-CM

## 2014-06-15 DIAGNOSIS — F0391 Unspecified dementia with behavioral disturbance: Secondary | ICD-10-CM

## 2014-06-15 DIAGNOSIS — F03918 Unspecified dementia, unspecified severity, with other behavioral disturbance: Secondary | ICD-10-CM

## 2014-06-15 MED ORDER — MECLIZINE HCL 12.5 MG PO TABS: 12.5000 mg | ORAL_TABLET | Freq: Every day | ORAL | Status: DC | PRN

## 2014-06-15 NOTE — Progress Notes (Signed)
Patient ID: [name], male   DOB: 15-Aug-1927, 81 y.o.   MRN: [id number]   Location:  [facility]  Code Status: full code; has hcpoa in place   Allergies  Allergen Reactions  . Diovan [Valsartan] Other (See Comments)    Unknown     Chief Complaint  Patient presents with  . Medical Management of Chronic Issues    HPI: Patient is a 81 y.o. white male seen in the office today for med mgt chronic diseases.  He saw Dr. Nyoka Cowden due to his "inhaler not working".    Had not fallen for 9 mos, then saw Dr. Nyoka Cowden and told him so and he fell that night.  No injury.    His gait remains very unsteady.  He says he is swimmy-headed.  Swimmy-headedness started yesterday--meclizine had to be restarted.  Starts when he turns his head.    Has been pretty good cognitively lately.  His sister is his hcpoa and she took his money out of his acct and put it in her name.  He wants to get her off his finances.  She saw him 9x last year.  He is not getting his allocation of his money.  He wants his caregiver to be his poa.    Sees Dr. Pablo Ledger for f/u scans in April to see how the XRT did with his presumed lung cancer.   Review of Systems:  Review of Systems  Constitutional: Negative for fever, chills and malaise/fatigue.  HENT: Negative for congestion.   Eyes: Negative for blurred vision.  Respiratory: Positive for cough, shortness of breath and wheezing.   Cardiovascular: Negative for chest pain.  Gastrointestinal: Negative for constipation, blood in stool and melena.  Genitourinary: Negative for dysuria.  Musculoskeletal: Positive for falls. Negative for myalgias.  Skin: Negative for rash.  Neurological: Positive for dizziness. Negative for loss of consciousness and weakness.  Endo/Heme/Allergies: Does not bruise/bleed easily.  Psychiatric/Behavioral: Negative for depression and memory loss.     Past Medical History  Diagnosis Date  . Arthritis     . Depression   . Hypertension   . Hyperlipidemia   . Colon cancer   . AAA (abdominal aortic aneurysm) 2011  . CAD (coronary artery disease) 2003  . Anemia   . Diverticula of colon 2013  . Hypertrophy of prostate without urinary obstruction and other lower urinary tract symptoms (LUTS) 2008  . Hyperpotassemia 2008  . Gout, unspecified 2008  . Impacted cerumen 06/20/2006  . First degree atrioventricular block 2008  . Abnormality of gait 2007  . Other specified cardiac dysrhythmias(427.89) 2005  . Unspecified disorder of kidney and ureter 2005  . Edema 2005  . Malignant neoplasm of colon, unspecified site 2004  . Unspecified hereditary and idiopathic peripheral neuropathy 2004  . Chest pain, unspecified 2003  . Pain in joint, pelvic region and thigh 2004  . Pain in limb 2003  . Myocardial infarction 1984  . Exertional shortness of breath   . Pneumonia ~ 02/2013    "hospitalized for double pneumonia" (05/15/2013)  . Anxiety   . Chronic kidney disease     "born w/only 1 kidney" (05/15/2013)  . Chronic kidney disease (CKD), stage III (moderate)     [name] 05/14/2013 (05/15/2013)  . Postural dizziness     hospitalized 05/14/2013    Past Surgical History  Procedure Laterality Date  . Mediastinotomy    . Right colectomy  2004  . Inguinal hernia repair Right  [place]  . Coronary artery bypass graft  1996    Bartle, MD  . Cardiac catheterization    . Colon surgery    . Lesion removal N/A 08/12/2013    Procedure: EXCISION FACIAL SKIN CANCER X 3    (MINOR PROCEDURE) ;  Surgeon: Rozetta Nunnery, MD;  Location: [facility];  Service: ENT;  Laterality: N/A;  . Cataract extraction  02/04/14    Right eye  . Cataract extraction  02/25/14    Left eye   . Eyelid laceration repair  05/18/14    Left eye     Social History:   reports that he quit smoking about 32 years ago. His smoking use included Cigarettes. He has a 35 pack-year smoking history. He has quit using  smokeless tobacco. His smokeless tobacco use included Snuff. He reports that he does not drink alcohol or use illicit drugs.  Family History  Problem Relation Age of Onset  . Heart disease Father   . Pancreatic cancer Sister   . Cancer Sister     liver  . Cancer Brother     Leukemia  . Cancer Mother     ?    Medications: Patient's Medications  New Prescriptions   No medications on file  Previous Medications   ALBUTEROL (PROVENTIL HFA;VENTOLIN HFA) 108 (90 BASE) MCG/ACT INHALER    Inhale 2 puffs into the lungs every 6 (six) hours as needed for wheezing or shortness of breath.   ALPRAZOLAM (XANAX) 0.25 MG TABLET    Take 0.25 mg by mouth 2 (two) times daily as needed for anxiety.   AMLODIPINE (NORVASC) 5 MG TABLET    Take one tablet by mouth once daily   ATORVASTATIN (LIPITOR) 20 MG TABLET    Take one tablet by mouth once daily for cholesterol   CITALOPRAM (CELEXA) 20 MG TABLET    Take 1 tablet (20 mg total) by mouth daily.   [employer] ASPIRIN EC 81 MG EC TABLET    TAKE 1 TABLET EVERY DAY   DIVALPROEX (DEPAKOTE SPRINKLE) 125 MG CAPSULE    Take 1 capsule (125 mg total) by mouth daily after supper.   DONEPEZIL (ARICEPT) 10 MG TABLET    Take 10 mg by mouth at bedtime.   FEEDING SUPPLEMENT, ENSURE COMPLETE, (ENSURE COMPLETE) LIQD    Take 237 mLs by mouth 2 (two) times daily between meals.   FLUTICASONE FUROATE-VILANTEROL (BREO ELLIPTA) 100-25 MCG/INH AEPB    Inhale into the lungs once daily to help breathing   HYDRALAZINE (APRESOLINE) 25 MG TABLET    Take 75 mg by mouth 3 (three) times daily.   MECLIZINE (ANTIVERT) 12.5 MG TABLET    Take 12.5 mg by mouth daily as needed.    MEMANTINE (NAMENDA) 10 MG TABLET    Take 10 mg by mouth 2 (two) times daily.   MIRTAZAPINE (REMERON) 15 MG TABLET    TAKE 1/2 TABLET BY MOUTH EVERY NIGHT   SPIRIVA RESPIMAT 2.5 MCG/ACT AERS    daily.    TIOTROPIUM (SPIRIVA) 18 MCG INHALATION CAPSULE    Place 1 capsule (18 mcg total) into inhaler and inhale daily.  Modified  Medications   No medications on file  Discontinued Medications   No medications on file     Physical Exam: Filed Vitals:   06/15/14 1443  BP: 138/70  Pulse: 73  Temp: 98.1 F (36.7 C)  TempSrc: Oral  Resp: 18  Height: 6' (1.829 m)  Weight:  184 lb 12.8 oz (83.825 kg)  SpO2: 96%  Physical Exam  Constitutional: He appears well-developed and well-nourished. No distress.  Cardiovascular: Normal rate, regular rhythm, normal heart sounds and intact distal pulses.   Pulmonary/Chest: Effort normal. He has wheezes.  Abdominal: Soft. Bowel sounds are normal. He exhibits no distension and no mass. There is no tenderness.  Musculoskeletal: Normal range of motion.  Ambulates with rollator walker  Neurological: He is alert.  Oriented to person and place, not precise time  Skin: Skin is warm and dry.  Psychiatric: He has a normal mood and affect.    Labs reviewed: Basic Metabolic Panel:  Recent Labs  11/15/13 0445 11/16/13 0320 12/04/13 0923  NA 145 141 138  K 5.5* 5.7* 4.7  CL 110 109 104  CO2 24 21 18   GLUCOSE 82 107* 107*  BUN 35* 39* 41*  CREATININE 2.10* 2.16* 2.66*  CALCIUM 8.8 8.8 8.8   Liver Function Tests:  Recent Labs  11/14/13 1557 11/15/13 0445  AST 17 16  ALT 11 10  ALKPHOS 76 67  BILITOT 0.2* 0.2*  PROT 6.9 6.3  ALBUMIN 3.4* 3.1*    Recent Labs  11/14/13 1557  LIPASE 85*   No results for input(s): AMMONIA in the last 8760 hours. CBC:  Recent Labs  11/14/13 1557 11/15/13 0445 11/16/13 0320 12/04/13 0923  WBC 8.2 5.3 4.3 6.2  NEUTROABS 6.2  --   --  4.2  HGB 10.7* 9.9* 9.9* 10.9*  HCT 34.1* 31.6* 31.0* 33.8*  MCV 97.7 97.5 96.3 95  PLT 180 159 166  --    Lipid Panel:  Recent Labs  12/04/13 0923  HDL 33*  LDLCALC 62  TRIG 92  CHOLHDL 3.4   Lab Results  Component Value Date   HGBA1C 5.7* 12/04/2013     Assessment/Plan 1. Benign paroxysmal positional vertigo, unspecified laterality -has returned  -got a few meclizine  tablets called in for her last night--given full Rx today - Home Health referral for PT for Epley maneuver to treat vertigo prescribed - Face-to-face encounter (required for Medicare/Medicaid patients)  2. Dementia with behavioral disturbance -cont namenda and aricept as well as celexa with depakote--has not been needing the xanax much lately -also on remeron due to appetite loss over course of XRT for presumed lung cancer  3. Chronic obstructive pulmonary disease, unspecified COPD, unspecified chronic bronchitis type -cont spiriva, breo, albuterol  4. DM type 2 with diabetic peripheral neuropathy -has been stable - Ambulatory referral to Podiatry for safe nail trimming due to thickened nails  Labs/tests ordered:  No new today; has future labs ordered by Dr. Nyoka Cowden, but did not have done before this and I discovered them after visit  Next appt:  3 mos  Ladarren Steiner L. Neveyah Garzon, D.O. [facility] [address], [place] [address] Cell Phone (Mon-Fri 8am-5pm):  [phone] On Call:  [phone] & follow prompts after 5pm & weekends Office Phone:  [phone] Office Fax:  [phone]

## 2014-06-18 ENCOUNTER — Encounter: Payer: Self-pay | Admitting: Family

## 2014-06-19 ENCOUNTER — Ambulatory Visit: Payer: Medicare Other | Admitting: Family

## 2014-06-19 ENCOUNTER — Other Ambulatory Visit (HOSPITAL_COMMUNITY): Payer: Medicare Other

## 2014-06-23 ENCOUNTER — Encounter: Payer: Self-pay | Admitting: Family

## 2014-06-24 ENCOUNTER — Encounter: Payer: Self-pay | Admitting: Family

## 2014-06-24 ENCOUNTER — Ambulatory Visit (HOSPITAL_COMMUNITY)
Admission: RE | Admit: 2014-06-24 | Discharge: 2014-06-24 | Disposition: A | Discharge status: Home / Self Care | Payer: Medicare Other | Source: Ambulatory Visit | Attending: Family | Admitting: Family

## 2014-06-24 ENCOUNTER — Ambulatory Visit (INDEPENDENT_AMBULATORY_CARE_PROVIDER_SITE_OTHER): Payer: Medicare Other | Admitting: Family

## 2014-06-24 VITALS — BP 130/70 | HR 68 | Resp 16 | Ht 72.0 in | Wt 184.0 lb

## 2014-06-24 DIAGNOSIS — I714 Abdominal aortic aneurysm, without rupture, unspecified: Secondary | ICD-10-CM

## 2014-06-24 NOTE — Progress Notes (Signed)
[facility] [place]  Established Abdominal Aortic Aneurysm  History of Present Illness  [name] is a 81 y.o. (09/14/1927) male patient of Dr. Trula Slade who is back today for followup of his abdominal aortic aneurysm. Dr. Trula Slade has contemplated surgical correction the past, however pt has not been healthy enough from a medical perspective. He has a history of nephrectomy and has issues with renal insufficiency. His imaging studies have been through noncontrasted scans.  Review of records: 12/01/13 visit: CT scan which shows a stable 5.2 infrarenal abdominal aortic aneurysm with heavy calcification in the femoral arteries.  Dr. Stephens Shire impressions at 12/01/13 visit were as follows: The patient's aneurysm remained stable in size by CT scan measurements. I do not think the patient is healthy enough to proceed with repair at this time. He has multiple medical issues which have not been resolved completely. I discussed this with the patient. I recommend having him come back for a repeat imaging study in 6 months. I think that he is a candidate for endovascular repair, however I'm not sure if this can be done percutaneously given the amount of calcium within his groins. He only has one kidney as well. He was using a cane and was falling excessively, he needs to use a walker now and seems that his arms are deconditioned and he does not walk much due to his arms hurting with using walker.   The patient does not have back or abdominal pain.  The patient is a former smoker. The patient does not seem to walk enough to elicit claudication symtpoms in legs with walking. The patient denies history of stroke or TIA symptoms.  Pt Diabetic: prediabetes according to his caregiver with him, as told to her by his PCP. Pt smoker: former smoker, quit about 2000  Past Medical History  Diagnosis Date  . Arthritis   . Depression   . Hypertension   . Hyperlipidemia   . Colon  cancer   . AAA (abdominal aortic aneurysm) 2011  . CAD (coronary artery disease) 2003  . Anemia   . Diverticula of colon 2013  . Hypertrophy of prostate without urinary obstruction and other lower urinary tract symptoms (LUTS) 2008  . Hyperpotassemia 2008  . Gout, unspecified 2008  . Impacted cerumen 06/20/2006  . First degree atrioventricular block 2008  . Abnormality of gait 2007  . Other specified cardiac dysrhythmias(427.89) 2005  . Unspecified disorder of kidney and ureter 2005  . Edema 2005  . Malignant neoplasm of colon, unspecified site 2004  . Unspecified hereditary and idiopathic peripheral neuropathy 2004  . Chest pain, unspecified 2003  . Pain in joint, pelvic region and thigh 2004  . Pain in limb 2003  . Myocardial infarction 1984  . Exertional shortness of breath   . Pneumonia ~ 02/2013    "hospitalized for double pneumonia" (05/15/2013)  . Anxiety   . Chronic kidney disease     "born w/only 1 kidney" (05/15/2013)  . Chronic kidney disease (CKD), stage III (moderate)     [name] 05/14/2013 (05/15/2013)  . Postural dizziness     hospitalized 05/14/2013  . Fall at home Nov. 2015   Past Surgical History  Procedure Laterality Date  . Mediastinotomy    . Right colectomy  2004  . Inguinal hernia repair Right 1952    In [place]  . Coronary artery bypass graft  1996    Bartle, [place]  . Cardiac catheterization    . Colon surgery    .  Lesion removal N/A 08/12/2013    Procedure: EXCISION FACIAL SKIN CANCER X 3    (MINOR PROCEDURE) ;  Surgeon: Rozetta Nunnery, MD;  Location: [facility];  Service: ENT;  Laterality: N/A;  . Cataract extraction  02/04/14    Right eye  . Cataract extraction  02/25/14    Left eye   . Eyelid laceration repair  05/18/14    Left eye   . Eye surgery     Social History History   Social History  . Marital Status: Single    Spouse Name: N/A    Number of Children: 0  . Years of Education: N/A   Occupational History  .      Social History Main Topics  . Smoking status: Former Smoker -- 1.00 packs/day for 35 years    Types: Cigarettes    Quit date: 06/12/1982  . Smokeless tobacco: Former Systems developer    Types: Snuff  . Alcohol Use: No  . Drug Use: No  . Sexual Activity: No   Other Topics Concern  . Not on file   Social History Narrative   Family History Family History  Problem Relation Age of Onset  . Heart disease Father   . Pancreatic cancer Sister   . Cancer Sister     liver  . Cancer Brother     Leukemia  . Cancer Mother     ?    Current Outpatient Prescriptions on File Prior to Visit  Medication Sig Dispense Refill  . albuterol (PROVENTIL HFA;VENTOLIN HFA) 108 (90 BASE) MCG/ACT inhaler Inhale 2 puffs into the lungs every 6 (six) hours as needed for wheezing or shortness of breath. 1 Inhaler 5  . ALPRAZolam (XANAX) 0.25 MG tablet Take 0.25 mg by mouth 2 (two) times daily as needed for anxiety.    Marland Kitchen amLODipine (NORVASC) 5 MG tablet Take one tablet by mouth once daily 90 tablet 3  . atorvastatin (LIPITOR) 20 MG tablet Take one tablet by mouth once daily for cholesterol 90 tablet 3  . citalopram (CELEXA) 20 MG tablet Take 1 tablet (20 mg total) by mouth daily. 90 tablet 3  . CVS ASPIRIN EC 81 MG EC tablet TAKE 1 TABLET EVERY DAY 120 tablet 1  . divalproex (DEPAKOTE SPRINKLE) 125 MG capsule Take 1 capsule (125 mg total) by mouth daily after supper. 30 capsule 3  . donepezil (ARICEPT) 10 MG tablet Take 10 mg by mouth at bedtime.    . feeding supplement, ENSURE COMPLETE, (ENSURE COMPLETE) LIQD Take 237 mLs by mouth 2 (two) times daily between meals. 30 Bottle 5  . Fluticasone Furoate-Vilanterol (BREO ELLIPTA) 100-25 MCG/INH AEPB Inhale into the lungs once daily to help breathing 1 each 4  . hydrALAZINE (APRESOLINE) 25 MG tablet Take 75 mg by mouth 3 (three) times daily.    . meclizine (ANTIVERT) 12.5 MG tablet Take 1 tablet (12.5 mg total) by mouth daily as needed for dizziness. 30 tablet 3  .  memantine (NAMENDA) 10 MG tablet Take 10 mg by mouth 2 (two) times daily.    . mirtazapine (REMERON) 15 MG tablet TAKE 1/2 TABLET BY MOUTH EVERY NIGHT 45 tablet 1  . SPIRIVA RESPIMAT 2.5 MCG/ACT AERS daily.     Marland Kitchen tiotropium (SPIRIVA) 18 MCG inhalation capsule Place 1 capsule (18 mcg total) into inhaler and inhale daily. 30 capsule 1   No current facility-administered medications on file prior to visit.   Allergies  Allergen Reactions  . Diovan [Valsartan] Other (See  Comments)    Unknown     ROS: See HPI for pertinent positives and negatives.  Physical Examination  Filed Vitals:   06/24/14 1113  BP: 130/70  Pulse: 68  Resp: 16  Height: 6' (1.829 m)  Weight: 184 lb (83.462 kg)  SpO2: 97%   Body mass index is 24.95 kg/(m^2).  General: A&O x 3, WD.  Pulmonary: Sym exp, fair air movt, CTAB, no rales, rhonchi, or wheezing.  Cardiac: RRR, Nl S1, S2, no detected murmur.   Carotid Bruits Right Left   Negative Negative   Aorta is palpable Radial pulses are are 2+ palpable and =                          VASCULAR EXAM:                                                                                                         LE Pulses Right Left       FEMORAL   palpable   palpable        POPLITEAL  not palpable   not palpable       POSTERIOR TIBIAL  not palpable   not palpable        DORSALIS PEDIS      ANTERIOR TIBIAL  palpable   palpable      Gastrointestinal: soft, NTND, -G/R, - HSM, - masses palpated, - CVAT B.  Musculoskeletal: M/S 5/5 throughout, Extremities without ischemic changes.  Neurologic: CN 2-12 intact, Pain and light touch intact in extremities are intact. Motor exam as listed above.  Non-Invasive Vascular Imaging  AAA Duplex (06/24/2014) ABDOMINAL AORTA DUPLEX EVALUATION    INDICATION: Evaluation of abdominal aorta.    PREVIOUS INTERVENTION(S):     DUPLEX EXAM:     LOCATION DIAMETER AP (cm) DIAMETER TRANSVERSE (cm) VELOCITIES (cm/sec)  Aorta  Proximal 3.14  33  Aorta Mid 4.71 4.83 29  Aorta Distal 2.71 2.76 49  Right Common Iliac Artery 2.11 2.17 48  Left Common Iliac Artery 1.33 1.41 242    Previous max aortic diameter:  5.00 x 4.3 Date: 12/30/2012     ADDITIONAL FINDINGS:     IMPRESSION: Patent abdominal aortic aneurysm measuring approximately 4.71 x 4.83 cm in diameter with thrombus identified within the vessel.    Compared to the previous exam:  No significant change in comparison to the last exam on 12/30/2012.     Medical Decision Making  The patient is a 81 y.o. male who presents with asymptomatic AAA with no significant change in comparison to the last exam on 12/30/2012.   Based on this patient's exam and diagnostic studies, the patient will follow up in 6 months with the following studies: AAA Duplex.  Consideration for repair of AAA would be made when the size is 5.5 cm, growth > 1 cm/yr, and symptomatic status.  I emphasized the importance of maximal medical management including strict control of blood pressure, blood glucose, and lipid levels, antiplatelet agents, obtaining regular exercise, and continu cessation  of smoking.   The patient was given information about AAA including signs, symptoms, treatment, and how to minimize the risk of enlargement and rupture of aneurysms.    The patient was advised to call 911 should the patient experience sudden onset abdominal or back pain.   Thank you for allowing [place] to participate in this patient's care.  Clemon Chambers, RN, MSN, FNP-C [facility] [place] Office: [phone]  Clinic Physician: Scot Dock  06/24/2014, 11:33 AM

## 2014-06-24 NOTE — Progress Notes (Deleted)
[facility] [place]  Established Abdominal Aortic Aneurysm  History of Present Illness  [name] is a 81 y.o. ([birth year]) male patient of Dr. Trula Slade who is back today for followup of his abdominal aortic aneurysm. I have contemplated surgical correction the past, however he has not been healthy enough from a medical perspective. He is back today for followup. He has a history of nephrectomy and has issues with renal insufficiency. His imaging studies have been through noncontrasted scans. 12/01/13 visit: CT scan which shows a stable 5.2 infrarenal abdominal aortic aneurysm with heavy calcification in the femoral arteries.  Dr. Stephens Shire impressions at that visit were as follows: The patient's aneurysm remained stable in size I CT scan measurements. I do not think the patient is healthy enough to proceed with repair at this time. He has multiple medical issues which have not been resolved completely. I discussed this with the patient. I recommend having him come back for a repeat imaging study in 6 months. I think that he is a candidate for endovascular repair, however I'm not sure if this can be done percutaneously given the amount of calcium within his groins. He only has one kidney as well.    who presents with chief complaint: follow up for AAA.  Previous studies demonstrate an AAA, measuring *** cm.  The patient does *** have back or abdominal pain.  The patient is *** a smoker. The patient *** claudication in legs with walking ***. The patient *** history of stroke or TIA symptoms.  Pt Diabetic: {yes/no:20286} Pt smoker: {Smoker?:15292}  Past Medical History  Diagnosis Date  . Arthritis   . Depression   . Hypertension   . Hyperlipidemia   . Colon cancer   . AAA (abdominal aortic aneurysm) 2011  . CAD (coronary artery disease) 2003  . Anemia   . Diverticula of colon 2013  . Hypertrophy of prostate without urinary obstruction and other lower  urinary tract symptoms (LUTS) 2008  . Hyperpotassemia 2008  . Gout, unspecified 2008  . Impacted cerumen 06/20/2006  . First degree atrioventricular block 2008  . Abnormality of gait 2007  . Other specified cardiac dysrhythmias(427.89) 2005  . Unspecified disorder of kidney and ureter 2005  . Edema 2005  . Malignant neoplasm of colon, unspecified site 2004  . Unspecified hereditary and idiopathic peripheral neuropathy 2004  . Chest pain, unspecified 2003  . Pain in joint, pelvic region and thigh 2004  . Pain in limb 2003  . Myocardial infarction 1984  . Exertional shortness of breath   . Pneumonia ~ 02/2013    "hospitalized for double pneumonia" (05/15/2013)  . Anxiety   . Chronic kidney disease     "born w/only 1 kidney" (05/15/2013)  . Chronic kidney disease (CKD), stage III (moderate)     [name] 05/14/2013 (05/15/2013)  . Postural dizziness     hospitalized 05/14/2013  . Fall at home Nov. 2015   Past Surgical History  Procedure Laterality Date  . Mediastinotomy    . Right colectomy  2004  . Inguinal hernia repair Right 1952    In [place]  . Coronary artery bypass graft  1996    Bartle, MD  . Cardiac catheterization    . Colon surgery    . Lesion removal N/A 08/12/2013    Procedure: EXCISION FACIAL SKIN CANCER X 3    (MINOR PROCEDURE) ;  Surgeon: Rozetta Nunnery, MD;  Location: [facility];  Service: ENT;  Laterality: N/A;  .  Cataract extraction  02/04/14    Right eye  . Cataract extraction  02/25/14    Left eye   . Eyelid laceration repair  05/18/14    Left eye   . Eye surgery     Social History History   Social History  . Marital Status: Single    Spouse Name: N/A    Number of Children: 0  . Years of Education: N/A   Occupational History  .     Social History Main Topics  . Smoking status: Former Smoker -- 1.00 packs/day for 35 years    Types: Cigarettes    Quit date: 06/12/1982  . Smokeless tobacco: Former Systems developer    Types: Snuff  . Alcohol  Use: No  . Drug Use: No  . Sexual Activity: No   Other Topics Concern  . Not on file   Social History Narrative   Family History Family History  Problem Relation Age of Onset  . Heart disease Father   . Pancreatic cancer Sister   . Cancer Sister     liver  . Cancer Brother     Leukemia  . Cancer Mother     ?    Current Outpatient Prescriptions on File Prior to Visit  Medication Sig Dispense Refill  . albuterol (PROVENTIL HFA;VENTOLIN HFA) 108 (90 BASE) MCG/ACT inhaler Inhale 2 puffs into the lungs every 6 (six) hours as needed for wheezing or shortness of breath. 1 Inhaler 5  . ALPRAZolam (XANAX) 0.25 MG tablet Take 0.25 mg by mouth 2 (two) times daily as needed for anxiety.    Marland Kitchen amLODipine (NORVASC) 5 MG tablet Take one tablet by mouth once daily 90 tablet 3  . atorvastatin (LIPITOR) 20 MG tablet Take one tablet by mouth once daily for cholesterol 90 tablet 3  . citalopram (CELEXA) 20 MG tablet Take 1 tablet (20 mg total) by mouth daily. 90 tablet 3  . [facility] ASPIRIN EC 81 MG EC tablet TAKE 1 TABLET EVERY DAY 120 tablet 1  . divalproex (DEPAKOTE SPRINKLE) 125 MG capsule Take 1 capsule (125 mg total) by mouth daily after supper. 30 capsule 3  . donepezil (ARICEPT) 10 MG tablet Take 10 mg by mouth at bedtime.    . feeding supplement, ENSURE COMPLETE, (ENSURE COMPLETE) LIQD Take 237 mLs by mouth 2 (two) times daily between meals. 30 Bottle 5  . Fluticasone Furoate-Vilanterol (BREO ELLIPTA) 100-25 MCG/INH AEPB Inhale into the lungs once daily to help breathing 1 each 4  . hydrALAZINE (APRESOLINE) 25 MG tablet Take 75 mg by mouth 3 (three) times daily.    . meclizine (ANTIVERT) 12.5 MG tablet Take 1 tablet (12.5 mg total) by mouth daily as needed for dizziness. 30 tablet 3  . memantine (NAMENDA) 10 MG tablet Take 10 mg by mouth 2 (two) times daily.    . mirtazapine (REMERON) 15 MG tablet TAKE 1/2 TABLET BY MOUTH EVERY NIGHT 45 tablet 1  . SPIRIVA RESPIMAT 2.5 MCG/ACT AERS daily.     Marland Kitchen  tiotropium (SPIRIVA) 18 MCG inhalation capsule Place 1 capsule (18 mcg total) into inhaler and inhale daily. 30 capsule 1   No current facility-administered medications on file prior to visit.   Allergies  Allergen Reactions  . Diovan [Valsartan] Other (See Comments)    Unknown     ROS: See HPI for pertinent positives and negatives.  Physical Examination  Filed Vitals:   06/24/14 1113  BP: 130/70  Pulse: 68  Resp: 16  Height: 6' (1.829  m)  Weight: 184 lb (83.462 kg)  SpO2: 97%   Body mass index is 24.95 kg/(m^2).  General: A&O x 3, WD, ***.  Pulmonary: Sym exp, good air movt, CTAB, no rales, rhonchi, or wheezing.  Cardiac: RRR, Nl S1, S2, no *** murmur.   Carotid Bruits Right Left   {FINDINGS; POSITIVE NEGATIVE:5185615488} {FINDINGS; POSITIVE NEGATIVE:5185615488}   Aorta is *** palpable Radial pulses are ***                          VASCULAR EXAM:                                                                                                         LE Pulses Right Left       FEMORAL  {PE DOPPLER EXAM ORTHOSURG:330610::"*** palpable"}  {PE DOPPLER EXAM ORTHOSURG:330610::"*** palpable"}        POPLITEAL  {PE DOPPLER EXAM ORTHOSURG:330610::"*** palpable"}   {PE DOPPLER EXAM ORTHOSURG:330610::"*** palpable"}       POSTERIOR TIBIAL  {PE DOPPLER EXAM ORTHOSURG:330610::"*** palpable"}   {PE DOPPLER EXAM ORTHOSURG:330610::"*** palpable"}        DORSALIS PEDIS      ANTERIOR TIBIAL {PE DOPPLER EXAM ORTHOSURG:330610::"*** palpable"}   {PE DOPPLER EXAM ORTHOSURG:330610::"*** palpable"}        PERONEAL {PE DOPPLER EXAM ORTHOSURG:330610::"*** Palpable"}   {PE DOPPLER EXAM ORTHOSURG:330610::"*** Palpable"}      Gastrointestinal: soft, NTND, -G/R, - HSM, - masses palpated, - CVAT B.  Musculoskeletal: M/S 5/5 throughout *** except ***, Extremities without ischemic changes *** except  ***.  Neurologic: CN 2-12 intact *** except ***, Pain and light touch intact in  extremities are intact except ***, Motor exam as listed above.  Non-Invasive Vascular Imaging  AAA Duplex (06/24/2014)  Previous size: *** cm (Date: ***)  Current size:  *** cm (Date: ***)  Medical Decision Making  The patient is a 81 y.o. male who presents with ***symptomatic AAA with ***increasing size.   Based on this patient's exam and diagnostic studies, the patient will follow up in ***  with the following studies: ***.  Consideration for repair of AAA would be made when the size approaches 4.8 or 5.0 cm, growth > 1 cm/yr, and symptomatic status.  I emphasized the importance of maximal medical management including strict control of blood pressure, blood glucose, and lipid levels, antiplatelet agents, obtaining regular exercise, and *** cessation of smoking.   The patient was given information about AAA including signs, symptoms, treatment, and how to minimize the risk of enlargement and rupture of aneurysms.    The patient was advised to call 911 should the patient experience sudden onset abdominal or back pain.   Thank you for allowing [place] to participate in this patient's care.  Clemon Chambers, RN, MSN, FNP-C [facility] Office: [phone]  Clinic Physician: Scot Dock  06/24/2014, 11:33 AM

## 2014-06-24 NOTE — Patient Instructions (Signed)
Abdominal Aortic Aneurysm An aneurysm is a weakened or damaged part of an artery wall that bulges from the normal force of blood pumping through the body. An abdominal aortic aneurysm is an aneurysm that occurs in the lower part of the aorta, the main artery of the body.  The major concern with an abdominal aortic aneurysm is that it can enlarge and burst (rupture) or blood can flow between the layers of the wall of the aorta through a tear (aorticdissection). Both of these conditions can cause bleeding inside the body and can be life threatening unless diagnosed and treated promptly. CAUSES  The exact cause of an abdominal aortic aneurysm is unknown. Some contributing factors are:   A hardening of the arteries caused by the buildup of fat and other substances in the lining of a blood vessel (arteriosclerosis).  Inflammation of the walls of an artery (arteritis).   Connective tissue diseases, such as Marfan syndrome.   Abdominal trauma.   An infection, such as syphilis or staphylococcus, in the wall of the aorta (infectious aortitis) caused by bacteria. RISK FACTORS  Risk factors that contribute to an abdominal aortic aneurysm may include:  Age older than 60 years.   High blood pressure (hypertension).  Male gender.  Ethnicity (white race).  Obesity.  Family history of aneurysm (first degree relatives only).  Tobacco use. PREVENTION  The following healthy lifestyle habits may help decrease your risk of abdominal aortic aneurysm:  Quitting smoking. Smoking can raise your blood pressure and cause arteriosclerosis.  Limiting or avoiding alcohol.  Keeping your blood pressure, blood sugar level, and cholesterol levels within normal limits.  Decreasing your salt intake. In somepeople, too much salt can raise blood pressure and increase your risk of abdominal aortic aneurysm.  Eating a diet low in saturated fats and cholesterol.  Increasing your fiber intake by including  whole grains, vegetables, and fruits in your diet. Eating these foods may help lower blood pressure.  Maintaining a healthy weight.  Staying physically active and exercising regularly. SYMPTOMS  The symptoms of abdominal aortic aneurysm may vary depending on the size and rate of growth of the aneurysm.Most grow slowly and do not have any symptoms. When symptoms do occur, they may include:  Pain (abdomen, side, lower back, or groin). The pain may vary in intensity. A sudden onset of severe pain may indicate that the aneurysm has ruptured.  Feeling full after eating only small amounts of food.  Nausea or vomiting or both.  Feeling a pulsating lump in the abdomen.  Feeling faint or passing out. DIAGNOSIS  Since most unruptured abdominal aortic aneurysms have no symptoms, they are often discovered during diagnostic exams for other conditions. An aneurysm may be found during the following procedures:  Ultrasonography (A one-time screening for abdominal aortic aneurysm by ultrasonography is also recommended for all men aged 65-75 years who have ever smoked).  X-ray exams.  A computed tomography (CT).  Magnetic resonance imaging (MRI).  Angiography or arteriography. TREATMENT  Treatment of an abdominal aortic aneurysm depends on the size of your aneurysm, your age, and risk factors for rupture. Medication to control blood pressure and pain may be used to manage aneurysms smaller than 6 cm. Regular monitoring for enlargement may be recommended by your caregiver if:  The aneurysm is 3-4 cm in size (an annual ultrasonography may be recommended).  The aneurysm is 4-4.5 cm in size (an ultrasonography every 6 months may be recommended).  The aneurysm is larger than 4.5 cm in   size (your caregiver may ask that you be examined by a vascular surgeon). If your aneurysm is larger than 6 cm, surgical repair may be recommended. There are two main methods for repair of an aneurysm:   Endovascular  repair (a minimally invasive surgery). This is done most often.  Open repair. This method is used if an endovascular repair is not possible. Document Released: 03/08/2005 Document Revised: 09/23/2012 Document Reviewed: 06/28/2012 ExitCare Patient Information 2015 [employer]. This information is not intended to replace advice given to you by your health care provider. Make sure you discuss any questions you have with your health care provider.  

## 2014-06-26 ENCOUNTER — Other Ambulatory Visit: Payer: Self-pay | Admitting: Internal Medicine

## 2014-06-26 MED ORDER — ATORVASTATIN CALCIUM 20 MG PO TABS
ORAL_TABLET | ORAL | Status: AC
Start: 2014-06-26 — End: ?

## 2014-06-26 NOTE — Telephone Encounter (Signed)
[facility] [address] 

## 2014-06-29 ENCOUNTER — Encounter: Payer: Self-pay | Admitting: Podiatry

## 2014-06-29 ENCOUNTER — Ambulatory Visit (INDEPENDENT_AMBULATORY_CARE_PROVIDER_SITE_OTHER): Payer: Medicare Other | Admitting: Podiatry

## 2014-06-29 VITALS — BP 138/85 | HR 76 | Resp 13 | Ht 72.0 in | Wt 186.0 lb

## 2014-06-29 DIAGNOSIS — E1142 Type 2 diabetes mellitus with diabetic polyneuropathy: Secondary | ICD-10-CM

## 2014-06-29 DIAGNOSIS — B351 Tinea unguium: Secondary | ICD-10-CM

## 2014-06-29 DIAGNOSIS — G629 Polyneuropathy, unspecified: Secondary | ICD-10-CM

## 2014-06-29 NOTE — Progress Notes (Signed)
   Subjective:    Patient ID: [name], male    DOB: 02/20/1928, 81 y.o.   MRN: [id number]  HPI Comments: N debridment L 10 toenails D and O long-term C thickened, elongated toenails A diabetic pt, difficulty cutting T caregiver performed pedicure  Diabetes      Review of Systems  All other systems reviewed and are negative.      Objective:   Physical Exam  Patient does respond to questioning with his caregiver present in the room  Vascular: DP pulse left 0/4 DP pulse right 2/4 PT pulses 0/4 bilaterally  Neurological: Sensation to 10 g monofilament wire intact 0/5 bilaterally Vibratory sensation nonreactive bilaterally Ankle reflexes nonreactive bilaterally Dorsi flexion plantar flexion 0/5 bilaterally  Dermatological: The toenails are elongated, incurvated, discolored 6-10  Musculoskeletal: Bilateral AFOs HAV deformities Gait disturbance requiring roller walker       Assessment & Plan:   Assessment Diabetic with diabetic peripheral neuropathy: Peripheral arterial disease bilaterally Loss of protective sensation bilaterally Sensory motor neuropathy bilaterally Gait disturbance bilaterally Onychomycoses 6-10  Plan: Debridement of toenails 10 without a bleeding  Reappoint at three-month intervals

## 2014-06-29 NOTE — Patient Instructions (Signed)
Diabetes and Foot Care Diabetes may cause you to have problems because of poor blood supply (circulation) to your feet and legs. This may cause the skin on your feet to become thinner, break easier, and heal more slowly. Your skin may become dry, and the skin may peel and crack. You may also have nerve damage in your legs and feet causing decreased feeling in them. You may not notice minor injuries to your feet that could lead to infections or more serious problems. Taking care of your feet is one of the most important things you can do for yourself.  HOME CARE INSTRUCTIONS  Wear shoes at all times, even in the house. Do not go barefoot. Bare feet are easily injured.  Check your feet daily for blisters, cuts, and redness. If you cannot see the bottom of your feet, use a mirror or ask someone for help.  Wash your feet with warm water (do not use hot water) and mild soap. Then pat your feet and the areas between your toes until they are completely dry. Do not soak your feet as this can dry your skin.  Apply a moisturizing lotion or petroleum jelly (that does not contain alcohol and is unscented) to the skin on your feet and to dry, brittle toenails. Do not apply lotion between your toes.  Trim your toenails straight across. Do not dig under them or around the cuticle. File the edges of your nails with an emery board or nail file.  Do not cut corns or calluses or try to remove them with medicine.  Wear clean socks or stockings every day. Make sure they are not too tight. Do not wear knee-high stockings since they may decrease blood flow to your legs.  Wear shoes that fit properly and have enough cushioning. To break in new shoes, wear them for just a few hours a day. This prevents you from injuring your feet. Always look in your shoes before you put them on to be sure there are no objects inside.  Do not cross your legs. This may decrease the blood flow to your feet.  If you find a minor scrape,  cut, or break in the skin on your feet, keep it and the skin around it clean and dry. These areas may be cleansed with mild soap and water. Do not cleanse the area with peroxide, alcohol, or iodine.  When you remove an adhesive bandage, be sure not to damage the skin around it.  If you have a wound, look at it several times a day to make sure it is healing.  Do not use heating pads or hot water bottles. They may burn your skin. If you have lost feeling in your feet or legs, you may not know it is happening until it is too late.  Make sure your health care provider performs a complete foot exam at least annually or more often if you have foot problems. Report any cuts, sores, or bruises to your health care provider immediately. SEEK MEDICAL CARE IF:   You have an injury that is not healing.  You have cuts or breaks in the skin.  You have an ingrown nail.  You notice redness on your legs or feet.  You feel burning or tingling in your legs or feet.  You have pain or cramps in your legs and feet.  Your legs or feet are numb.  Your feet always feel cold. SEEK IMMEDIATE MEDICAL CARE IF:   There is increasing redness,   swelling, or pain in or around a wound.  There is a red line that goes up your leg.  Pus is coming from a wound.  You develop a fever or as directed by your health care provider.  You notice a bad smell coming from an ulcer or wound. Document Released: 05/26/2000 Document Revised: 01/29/2013 Document Reviewed: 11/05/2012 ExitCare Patient Information 2015 [employer]. This information is not intended to replace advice given to you by your health care provider. Make sure you discuss any questions you have with your health care provider.  

## 2014-06-30 ENCOUNTER — Encounter: Payer: Self-pay | Admitting: Podiatry

## 2014-07-06 ENCOUNTER — Other Ambulatory Visit: Payer: Self-pay | Admitting: Internal Medicine

## 2014-07-20 ENCOUNTER — Other Ambulatory Visit: Payer: Self-pay | Admitting: Dermatology

## 2014-08-19 ENCOUNTER — Other Ambulatory Visit: Payer: Self-pay | Admitting: Nurse Practitioner

## 2014-08-28 ENCOUNTER — Ambulatory Visit (INDEPENDENT_AMBULATORY_CARE_PROVIDER_SITE_OTHER): Payer: Medicare Other | Admitting: Internal Medicine

## 2014-08-28 ENCOUNTER — Encounter: Payer: Self-pay | Admitting: Internal Medicine

## 2014-08-28 VITALS — BP 124/62 | HR 70 | Temp 97.9°F | Wt 180.0 lb

## 2014-08-28 DIAGNOSIS — I10 Essential (primary) hypertension: Secondary | ICD-10-CM

## 2014-08-28 DIAGNOSIS — R4182 Altered mental status, unspecified: Secondary | ICD-10-CM

## 2014-08-28 DIAGNOSIS — Z9181 History of falling: Secondary | ICD-10-CM

## 2014-08-28 DIAGNOSIS — F0391 Unspecified dementia with behavioral disturbance: Secondary | ICD-10-CM

## 2014-08-28 DIAGNOSIS — F03918 Unspecified dementia, unspecified severity, with other behavioral disturbance: Secondary | ICD-10-CM

## 2014-08-28 MED ORDER — MECLIZINE HCL 12.5 MG PO TABS: 12.5000 mg | ORAL_TABLET | Freq: Every day | ORAL | Status: AC | PRN

## 2014-08-28 MED ORDER — ALPRAZOLAM 0.5 MG PO TBDP: 0.5000 mg | ORAL_TABLET | Freq: Two times a day (BID) | ORAL | Status: AC | PRN

## 2014-08-28 MED ORDER — DIVALPROEX SODIUM 125 MG PO CPSP: 125.0000 mg | ORAL_CAPSULE | Freq: Two times a day (BID) | ORAL | Status: DC

## 2014-08-28 NOTE — Patient Instructions (Signed)
Increase depakote to twice daily  Increase alprazolam to 0.5 mg twice daily as needed  Continue all other medications as ordered  Keep appt with Dr Mariea Clonts as scheduled next month  Will call with lab results.
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
## 2014-08-29 ENCOUNTER — Other Ambulatory Visit: Payer: Self-pay | Admitting: Internal Medicine

## 2014-08-29 LAB — CBC WITH DIFFERENTIAL/PLATELET
BASOS ABS: 0 10*3/uL (ref 0.0–0.2)
Basos: 1 %
EOS ABS: 0.2 10*3/uL (ref 0.0–0.4)
Eos: 3 %
HCT: 38.6 % (ref 37.5–51.0)
Hemoglobin: 12.6 g/dL (ref 12.6–17.7)
Immature Grans (Abs): 0 10*3/uL (ref 0.0–0.1)
Immature Granulocytes: 0 %
Lymphocytes Absolute: 1.5 10*3/uL (ref 0.7–3.1)
Lymphs: 22 %
MCH: 29 pg (ref 26.6–33.0)
MCHC: 32.6 g/dL (ref 31.5–35.7)
MCV: 89 fL (ref 79–97)
MONOCYTES: 6 %
Monocytes Absolute: 0.4 10*3/uL (ref 0.1–0.9)
Neutrophils Absolute: 4.7 10*3/uL (ref 1.4–7.0)
Neutrophils Relative %: 68 %
Platelets: 196 10*3/uL (ref 150–379)
RBC: 4.34 x10E6/uL (ref 4.14–5.80)
RDW: 14.7 % (ref 12.3–15.4)
WBC: 6.8 10*3/uL (ref 3.4–10.8)

## 2014-08-29 LAB — COMPREHENSIVE METABOLIC PANEL
A/G RATIO: 1.8 (ref 1.1–2.5)
ALT: 9 IU/L (ref 0–44)
AST: 20 IU/L (ref 0–40)
Albumin: 4 g/dL (ref 3.5–4.7)
Alkaline Phosphatase: 113 IU/L (ref 39–117)
BUN/Creatinine Ratio: 13 (ref 10–22)
BUN: 27 mg/dL (ref 8–27)
Bilirubin Total: 0.3 mg/dL (ref 0.0–1.2)
CALCIUM: 9.1 mg/dL (ref 8.6–10.2)
CO2: 21 mmol/L (ref 18–29)
CREATININE: 2.06 mg/dL — AB (ref 0.76–1.27)
Chloride: 106 mmol/L (ref 97–108)
GFR calc Af Amer: 33 mL/min/{1.73_m2} — ABNORMAL LOW (ref >59)
GFR calc non Af Amer: 29 mL/min/{1.73_m2} — ABNORMAL LOW (ref >59)
GLOBULIN, TOTAL: 2.2 g/dL (ref 1.5–4.5)
GLUCOSE: 98 mg/dL (ref 65–99)
POTASSIUM: 4.4 mmol/L (ref 3.5–5.2)
SODIUM: 142 mmol/L (ref 134–144)
Total Protein: 6.2 g/dL (ref 6.0–8.5)

## 2014-09-07 ENCOUNTER — Other Ambulatory Visit: Payer: Self-pay | Admitting: Internal Medicine

## 2014-09-17 ENCOUNTER — Encounter: Payer: Self-pay | Admitting: Internal Medicine

## 2014-09-17 ENCOUNTER — Ambulatory Visit (INDEPENDENT_AMBULATORY_CARE_PROVIDER_SITE_OTHER): Payer: Medicare Other | Admitting: Internal Medicine

## 2014-09-17 VITALS — BP 132/68 | HR 84 | Temp 97.4°F | Resp 20 | Ht 72.0 in | Wt 178.0 lb

## 2014-09-17 DIAGNOSIS — E785 Hyperlipidemia, unspecified: Secondary | ICD-10-CM

## 2014-09-17 DIAGNOSIS — R2681 Unsteadiness on feet: Secondary | ICD-10-CM | POA: Diagnosis not present

## 2014-09-17 DIAGNOSIS — J449 Chronic obstructive pulmonary disease, unspecified: Secondary | ICD-10-CM

## 2014-09-17 DIAGNOSIS — E1142 Type 2 diabetes mellitus with diabetic polyneuropathy: Secondary | ICD-10-CM | POA: Diagnosis not present

## 2014-09-17 DIAGNOSIS — F03918 Unspecified dementia, unspecified severity, with other behavioral disturbance: Secondary | ICD-10-CM

## 2014-09-17 DIAGNOSIS — N3941 Urge incontinence: Secondary | ICD-10-CM

## 2014-09-17 DIAGNOSIS — I10 Essential (primary) hypertension: Secondary | ICD-10-CM

## 2014-09-17 DIAGNOSIS — G629 Polyneuropathy, unspecified: Secondary | ICD-10-CM

## 2014-09-17 DIAGNOSIS — F0391 Unspecified dementia with behavioral disturbance: Secondary | ICD-10-CM

## 2014-09-17 NOTE — Progress Notes (Signed)
Patient ID: [name], male   DOB: 12-Mar-1928, 81 y.o.   MRN: [id number]   Location:  [facility] Office Goals of Care: Advanced Directive information Does patient have an advance directive?: No, Would patient like information on creating an advanced directive?: Yes - Educational materials given   Allergies  Allergen Reactions  . Diovan [Valsartan] Other (See Comments)    Unknown     Chief Complaint  Patient presents with  . Medical Management of Chronic Issues    3 month follow-up, Discuss labs (copy printed)    HPI: Patient is a 81 y.o. white male seen in the office today for med mgt of chronic diseases.    Gradually improving since Dr. Eulas Post changed medication.   Very unsteady walking.  Had outpatient therapy for a while last time that his gait was declining--around march last year.  Has not had PT since.  Has no balance.  Not using his walker properly and too weak to get up out of chair.  Can't button shirt and dress self.  Lost 8 lbs since last visit when his moods have been poor.  Caregiver is too stressed in the environment.  He is treating her poorly.  The family is not pleasant to her.  Has fallen more than 2x in past year.    His sister applied for PACE in [place] but he doesn't have medicaid yet to qualify.    He says he feels very sleepy.  Not dizzy.   Has appt upcoming with Dr. Pablo Ledger with CT before this month Has appts in July for AAA f/u and with vascular  Review of Systems:  Review of Systems  Constitutional: Positive for weight loss and malaise/fatigue. Negative for fever and chills.  HENT: Negative for congestion.   Respiratory: Negative for shortness of breath.   Cardiovascular: Negative for chest pain.  Gastrointestinal: Negative for abdominal pain, constipation, blood in stool and melena.  Genitourinary: Positive for urgency. Negative for dysuria.  Musculoskeletal: Positive for falls. Negative for myalgias.    Skin: Negative for rash.  Neurological: Positive for dizziness and weakness. Negative for loss of consciousness.  Psychiatric/Behavioral: Positive for depression and memory loss. The patient is nervous/anxious.     Past Medical History  Diagnosis Date  . Arthritis   . Depression   . Hypertension   . Hyperlipidemia   . Colon cancer   . AAA (abdominal aortic aneurysm) 2011  . CAD (coronary artery disease) 2003  . Anemia   . Diverticula of colon 2013  . Hypertrophy of prostate without urinary obstruction and other lower urinary tract symptoms (LUTS) 2008  . Hyperpotassemia 2008  . Gout, unspecified 2008  . Impacted cerumen 06/20/2006  . First degree atrioventricular block 2008  . Abnormality of gait 2007  . Other specified cardiac dysrhythmias(427.89) 2005  . Unspecified disorder of kidney and ureter 2005  . Edema 2005  . Malignant neoplasm of colon, unspecified site 2004  . Unspecified hereditary and idiopathic peripheral neuropathy 2004  . Chest pain, unspecified 2003  . Pain in joint, pelvic region and thigh 2004  . Pain in limb 2003  . Myocardial infarction 1984  . Exertional shortness of breath   . Pneumonia ~ 02/2013    "hospitalized for double pneumonia" (05/15/2013)  . Anxiety   . Chronic kidney disease     "born w/only 1 kidney" (05/15/2013)  . Chronic kidney disease (CKD), stage III (moderate)     [name] 05/14/2013 (05/15/2013)  .  Postural dizziness     hospitalized 05/14/2013  . Fall at home Nov. 2015    Past Surgical History  Procedure Laterality Date  . Mediastinotomy    . Right colectomy  2004  . Inguinal hernia repair Right 1952    In [place]  . Coronary artery bypass graft  1996    Bartle, [place]  . Cardiac catheterization    . Colon surgery    . Lesion removal N/A 08/12/2013    Procedure: EXCISION FACIAL SKIN CANCER X 3    (MINOR PROCEDURE) ;  Surgeon: Rozetta Nunnery, MD;  Location: [facility];  Service: ENT;  Laterality: N/A;  . Cataract  extraction  02/04/14    Right eye  . Cataract extraction  02/25/14    Left eye   . Eyelid laceration repair  05/18/14    Left eye   . Eye surgery      Social History:   reports that he quit smoking about 32 years ago. His smoking use included Cigarettes. He has a 35 pack-year smoking history. He has quit using smokeless tobacco. His smokeless tobacco use included Snuff. He reports that he does not drink alcohol or use illicit drugs.  Family History  Problem Relation Age of Onset  . Heart disease Father   . Pancreatic cancer Sister   . Cancer Sister     liver  . Cancer Brother     Leukemia  . Cancer Mother     ?    Medications: Patient's Medications  New Prescriptions   No medications on file  Previous Medications   ALBUTEROL (PROVENTIL HFA;VENTOLIN HFA) 108 (90 BASE) MCG/ACT INHALER    Inhale 2 puffs into the lungs every 6 (six) hours as needed for wheezing or shortness of breath.   ALPRAZOLAM (NIRAVAM) 0.5 MG DISSOLVABLE TABLET    Take 1 tablet (0.5 mg total) by mouth 2 (two) times daily as needed for anxiety.   AMLODIPINE (NORVASC) 5 MG TABLET    Take one tablet by mouth once daily   ATORVASTATIN (LIPITOR) 20 MG TABLET    Take one tablet by mouth once daily for cholesterol   CITALOPRAM (CELEXA) 20 MG TABLET    Take 1 tablet (20 mg total) by mouth daily.   [facility]S ASPIRIN EC 81 MG EC TABLET    TAKE 1 TABLET EVERY DAY   DIVALPROEX (DEPAKOTE SPRINKLE) 125 MG CAPSULE    Take 1 capsule (125 mg total) by mouth 2 (two) times daily.   DONEPEZIL (ARICEPT) 10 MG TABLET    Take 10 mg by mouth at bedtime.   FEEDING SUPPLEMENT, ENSURE COMPLETE, (ENSURE COMPLETE) LIQD    Take 237 mLs by mouth 2 (two) times daily between meals.   FLUTICASONE FUROATE-VILANTEROL (BREO ELLIPTA) 100-25 MCG/INH AEPB    Inhale into the lungs once daily to help breathing   HYDRALAZINE (APRESOLINE) 25 MG TABLET    TAKE THREE TABLETS BY MOUTH THREE TIMES DAILY. [facility] MUST FILL 90 DAY SUPPLY   MECLIZINE (ANTIVERT) 12.5 MG  TABLET    Take 1 tablet (12.5 mg total) by mouth daily as needed for dizziness.   MEMANTINE (NAMENDA) 10 MG TABLET    TAKE ONE TABLET BY MOUTH TWICE DAILY TO HELP PRESERVE MEMORY   MIRTAZAPINE (REMERON) 15 MG TABLET    TAKE 1/2 TABLET BY MOUTH EVERY NIGHT   SPIRIVA RESPIMAT 2.5 MCG/ACT AERS    daily.   Modified Medications   No medications on file  Discontinued Medications  AMLODIPINE (NORVASC) 5 MG TABLET    TAKE 1 TABLET BY MOUTH EVERY DAY   HYDRALAZINE (APRESOLINE) 25 MG TABLET    Take 75 mg by mouth 3 (three) times daily.   MEMANTINE (NAMENDA) 10 MG TABLET    Take 10 mg by mouth 2 (two) times daily.   TIOTROPIUM (SPIRIVA) 18 MCG INHALATION CAPSULE    Place 1 capsule (18 mcg total) into inhaler and inhale daily.     Physical Exam: Filed Vitals:   09/17/14 1133  BP: 132/68  Pulse: 84  Temp: 97.4 F (36.3 C)  TempSrc: Oral  Resp: 20  Height: 6' (1.829 m)  Weight: 178 lb (80.74 kg)  SpO2: 98%  Physical Exam  Musculoskeletal:  Cannot get up and go w/o use of arms and having difficulty remembering to lock walker and use his arms on the chair not the walker to stand, then walking with walker several feet in front of him and leaning over  Neurological: He is alert.  But falls asleep easily (has been the case for over a year)  Skin: Skin is warm and dry.  Multiple sun spots on arms and scars from skin cancer excisions  Psychiatric:  Difficult to get pt to smile     Labs reviewed: Basic Metabolic Panel:  Recent Labs  11/16/13 0320 12/04/13 0923 08/28/14 1502  NA 141 138 142  K 5.7* 4.7 4.4  CL 109 104 106  CO2 21 18 21   GLUCOSE 107* 107* 98  BUN 39* 41* 27  CREATININE 2.16* 2.66* 2.06*  CALCIUM 8.8 8.8 9.1   Liver Function Tests:  Recent Labs  11/14/13 1557 11/15/13 0445 08/28/14 1502  AST 17 16 20   ALT 11 10 9   ALKPHOS 76 67 113  BILITOT 0.2* 0.2* 0.3  PROT 6.9 6.3 6.2  ALBUMIN 3.4* 3.1*  --     Recent Labs  11/14/13 1557  LIPASE 85*   No results  for input(s): AMMONIA in the last 8760 hours. CBC:  Recent Labs  11/14/13 1557 11/15/13 0445 11/16/13 0320 12/04/13 0923 08/28/14 1502  WBC 8.2 5.3 4.3 6.2 6.8  NEUTROABS 6.2  --   --  4.2 4.7  HGB 10.7* 9.9* 9.9* 10.9* 12.6  HCT 34.1* 31.6* 31.0* 33.8* 38.6  MCV 97.7 97.5 96.3 95 89  PLT 180 159 166  --  196   Lipid Panel:  Recent Labs  12/04/13 0923  CHOL 113  HDL 33*  LDLCALC 62  TRIG 92  CHOLHDL 3.4   Lab Results  Component Value Date   HGBA1C 5.7* 12/04/2013    Assessment/Plan 1. DM type 2 with diabetic peripheral neuropathy -cont to monitor hba1c, sees podiatry for foot exams  2. Chronic obstructive pulmonary disease, unspecified COPD, unspecified chronic bronchitis type -cont spiriva, breo, proventil  3. Essential hypertension -at goal, cont bp meds--hydralazine, amlodipine -no longer orthostatic (at least not admitting to lightheadedness)  4. Dementia with behavioral disturbance -continues on aricept and namenda--I recommended changing to namzaric but his caregiver did not want me to for some reason  5. Hyperlipidemia -cont lipitor--consider discontinuing due to his advanced dementia   6. Urge incontinence of urine -off prostate meds--has been orthostatic   7.  Gait instability - referred for outpatient PT again -unfortunately I think this is a combination of dementia progressing and possibly some effect of depakote (he cannot go w/o it; however  Labs/tests ordered:   Orders Placed This Encounter  Procedures  . Lipid panel  Standing Status: Future     Number of Occurrences:      Standing Expiration Date: 03/19/2015    Order Specific Question:  Has the patient fasted?    Answer:  Yes  . Hemoglobin A1c    Standing Status: Future     Number of Occurrences:      Standing Expiration Date: 03/19/2015  . Microalbumin/Creatinine Ratio, Urine    Standing Status: Future     Number of Occurrences:      Standing Expiration Date: 03/19/2015  .  Valproic Acid Level    Standing Status: Future     Number of Occurrences:      Standing Expiration Date: 03/19/2015  . Ambulatory referral to Physical Therapy    Referral Priority:  Routine    Referral Type:  Physical Medicine    Referral Reason:  Specialty Services Required    Requested Specialty:  Physical Therapy    Number of Visits Requested:  1    Next appt:  3 mos with labs before  Deyvi Bonanno L. Tegan Britain, D.O. [facility] [address], [place] [address] Cell Phone (Mon-Fri 8am-5pm):  [phone] On Call:  [phone] & follow prompts after 5pm & weekends Office Phone:  [phone] Office Fax:  [phone]

## 2014-09-24 IMAGING — CT NM PET TUM IMG INITIAL (PI) SKULL BASE T - THIGH
1 of 8 series · 1 of 25 positions shown · non-contrast
Comparison: abdominal pelvic CT of 12/01/2013. Chest CT 11/24/2013.
No prior PET

CLINICAL DATA: Initial. Treatment strategy for Evaluate lung mass.
Right-sided colectomy in 2005.

EXAM:
NUCLEAR MEDICINE PET SKULL BASE TO THIGH
TECHNIQUE: 9.5 mCi F-18 FDG was injected intravenously. Full-ring PET imaging
was performed from the skull base to thigh after the radiotracer. CT
data was obtained and used for attenuation correction and anatomic
localization.
FASTING BLOOD GLUCOSE:  Value: 128 mg/dl

[Series 4: ct sk_thigh 5.0 b31f · axial · 5.0mm · 0.96mm/px · 1 of 230 slices shown]
[im 230/230  brain]
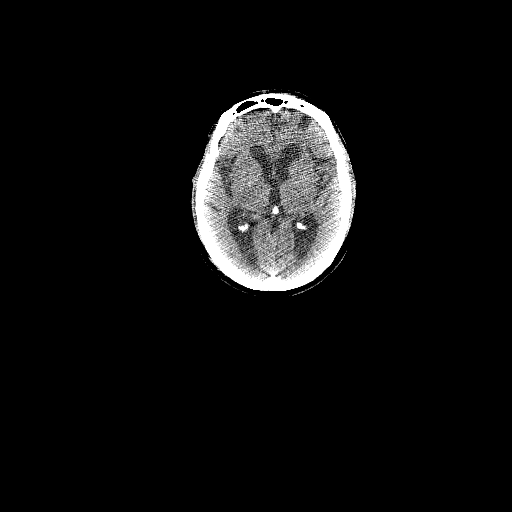

[1 of 25 positions shown; findings below may reference images not displayed]

FINDINGS: NECK

No areas of abnormal hypermetabolism.

CHEST

Sub solid left upper lobe pulmonary nodule which measures 1.7 x
cm and a S.U.V. max of 1.6 on image 23. On the prior exam, this
measured 1.8 x 1.5 cm. This difference is favored to be due to slice
selection.

More inferior posterior left upper lobe nodule is sub solid.
Measures 1.4 x 1.1 cm and a S.U.V. max of 3.5 on image 27. This
measured 1.8 x 1.4 cm on the prior exam. This difference is also
favored to be due to differences in slice selection.

The dominant superior segment left lower lobe lung mass measures
x 2.9 cm and a S.U.V. max of 13.5 on image 33. This is enlarged
since 11/24/2013, where it measured 2.5 x 2.1 cm. This likely has
direct extension superiorly toward the left peribronchovascular
bundle.

No abnormal thoracic nodal activity.

ABDOMEN/PELVIS

A focus of hypermetabolism within the sigmoid colon is without CT
correlate. This measures a S.U.V. max of 6.0, including on
approximately image 179.

SKELETON

No abnormal marrow activity.

CT IMAGES PERFORMED FOR ATTENUATION CORRECTION

Prominent ventricles, likely related to cerebral atrophy. There is
also cerebellar atrophy. Bilateral carotid atherosclerosis.
Exophytic right thyroid nodule which measures 2.1 cm and is not
hypermetabolic. Chest findings otherwise deferred to prior
diagnostic chest CT. No superimposed acute process or new pulmonary
nodule

Abdominal pelvic findings also deferred to prior diagnostic CT. Left
nephrectomy. Infrarenal abdominal aortic aneurysm. Maximally 4.8 cm.
Right common iliac aneurysm at 2.0 cm. Remote left rib trauma.
IMPRESSION: 1. Dominant superior segment left lower lobe lung nodule is
hypermetabolic and has likely enlarged since 11/24/2013. This is
highly suspicious for primary bronchogenic carcinoma.
2. 2 sub solid left upper lobe nodules which demonstrate more mild
hypermetabolism. These are suspicious for synchronous primary
bronchogenic carcinomas, likely low grade adenocarcinomas.
3. No evidence of thoracic nodal or extrathoracic disease.
4. Focus of hypermetabolism within the sigmoid. Favored to be
physiologic. In this patient with a history of colon cancer,
colonoscopy should be considered, if not recently performed.
5. Incidental findings, including abdominal aortic and right common
iliac artery aneurysms.

## 2014-09-28 ENCOUNTER — Ambulatory Visit (HOSPITAL_COMMUNITY)
Admission: RE | Admit: 2014-09-28 | Discharge: 2014-09-28 | Disposition: A | Discharge status: Home / Self Care | Payer: Medicare Other | Source: Ambulatory Visit | Attending: Radiation Oncology | Admitting: Radiation Oncology

## 2014-09-28 DIAGNOSIS — C7800 Secondary malignant neoplasm of unspecified lung: Secondary | ICD-10-CM | POA: Insufficient documentation

## 2014-09-28 DIAGNOSIS — C189 Malignant neoplasm of colon, unspecified: Secondary | ICD-10-CM | POA: Diagnosis not present

## 2014-09-30 NOTE — Progress Notes (Signed)
   Department of Radiation Oncology  Phone:  [phone] Fax:        [phone]   Name: [name] MRN: [id number]  DOB: [birth year]  Date: 10/02/2014  Follow Up Visit Note  Diagnosis: Metastatic colon cancer  Summary and Interval since last radiation: 50 Gy in 10 fractions completed 02/20/14  Interval History: [name] presents today for routine followup.  CT on 09/28/14 showed multiple new hypoattenuated lesions in the liver consistent with metastatic disease. The treated area in lung shows post radiation changes, but ortherwise stable. He is having more difficulty at home managing his dementia per his sister Education officer, community).   Physical Exam: No respiratory distress. Alert and oriented.  IMPRESSION: [name] is a 81 y.o. male s/p SBRT for metastatic colon cancer to lung now with liver metastasis  PLAN: His disease has progressed. Hospice care was referred and is appropriate. I have spoken to him, hiscaregiver and his sister. I have asked them to call Dr. Cyndi Lennert office for this referral.  Our cancer center social worker has discussed option for them on assisted living facilities who also contract with Hospice and [facility] and have given them these resources. I will follow up prn.   This document serves as a record of services personally performed by Thea Silversmith , MD. It was created on her behalf by Pearlie Oyster, a trained medical scribe. The creation of this record is based on the scribe's personal observations and the provider's statements to them. This document has been checked and approved by the attending provider.       Thea Silversmith, MD

## 2014-10-02 ENCOUNTER — Encounter: Payer: Self-pay | Admitting: Radiation Oncology

## 2014-10-02 ENCOUNTER — Ambulatory Visit
Admission: RE | Admit: 2014-10-02 | Discharge: 2014-10-02 | Disposition: A | Discharge status: Home / Self Care | Payer: Medicare Other | Source: Ambulatory Visit | Attending: Radiation Oncology | Admitting: Radiation Oncology

## 2014-10-02 VITALS — BP 122/65 | HR 73 | Temp 97.6°F | Resp 20 | Ht 72.0 in | Wt 163.4 lb

## 2014-10-02 DIAGNOSIS — C189 Malignant neoplasm of colon, unspecified: Secondary | ICD-10-CM

## 2014-10-02 NOTE — Progress Notes (Signed)
Nicoletta Dress here for follow up.  He denies pain.  He reports shortness of breath with activity.  He reports an occasional dry cough. He has lost 15 lbs since 09/17/14.  His caregive reports he is eating well and drinks 2 ensures per day.  She has noticed that he holds food in his throat when swallowing.  BP 122/65 mmHg  Pulse 73  Temp(Src) 97.6 F (36.4 C) (Oral)  Resp 20  Ht 6' (1.829 m)  Wt 163 lb 6.4 oz (74.118 kg)  BMI 22.16 kg/m2  SpO2 97%   Wt Readings from Last 3 Encounters:  09/17/14 178 lb (80.74 kg)  08/28/14 180 lb (81.647 kg)  06/29/14 186 lb (84.369 kg)

## 2014-10-03 ENCOUNTER — Other Ambulatory Visit: Payer: Self-pay | Admitting: Internal Medicine

## 2014-10-03 DIAGNOSIS — C189 Malignant neoplasm of colon, unspecified: Secondary | ICD-10-CM | POA: Insufficient documentation

## 2014-10-03 DIAGNOSIS — C787 Secondary malignant neoplasm of liver and intrahepatic bile duct: Principal | ICD-10-CM

## 2014-10-05 ENCOUNTER — Ambulatory Visit (INDEPENDENT_AMBULATORY_CARE_PROVIDER_SITE_OTHER): Payer: Medicare Other | Admitting: Podiatry

## 2014-10-05 ENCOUNTER — Encounter: Payer: Self-pay | Admitting: Podiatry

## 2014-10-05 VITALS — BP 127/67 | HR 69 | Temp 96.2°F | Resp 12

## 2014-10-05 DIAGNOSIS — B351 Tinea unguium: Secondary | ICD-10-CM

## 2014-10-05 DIAGNOSIS — G629 Polyneuropathy, unspecified: Secondary | ICD-10-CM

## 2014-10-05 DIAGNOSIS — E1142 Type 2 diabetes mellitus with diabetic polyneuropathy: Secondary | ICD-10-CM

## 2014-10-05 NOTE — Progress Notes (Signed)
   Subjective:    Patient ID: [name], male    DOB: Apr 14, 1928, 81 y.o.   MRN: [id number]  HPI  N-SORE, OPEN SORE L- RT FOOT GREAT TOE D-1 WEEK O-SUDDENLY C-WORSE A-PRESSURE T-NEOSPORIN  Patient presents for a scheduled visit for toenail debridement and mentions an area in the right hallux nail area that is been treated with topical antibiotic ointment  Review of Systems  Skin: Positive for color change.  All other systems reviewed and are negative.      Objective:   Physical Exam  Eschar medial right hallux nail without any active drainage, erythema, edema Lateral posterior nail fold area left hallux has superficial abrasion 8 mm red granular base, without any active drainage, erythema, warmth or edema The toenails are elongated, incurvated, discolored 6-10  Assessment: Eschar right hallux without clinical sign of infection Abrasion left hallux without clinical sign of infection Mycotic toenails 10 History of diabetic with neuropathy  Plan: Patient advised to apply topical antibiotic ointment to right and left hallux daily and cover with Band-Aids until healed Patient notices any sudden increase in redness, pain, swelling, drainage, warmth present to ER  Toenails 10 are debrided without any bleeding  Reappoint at three-month intervals or sooner if patient has concern      Assessment & Plan:

## 2014-10-05 NOTE — Patient Instructions (Signed)
Apply topical antibiotic and ointment daily to the scab on the right great toe and the abrasion on the left great toe until healed If you notice any sudden increase of pain, swelling, redness, warmth present for evaluation or to ER  Diabetes and Foot Care Diabetes may cause you to have problems because of poor blood supply (circulation) to your feet and legs. This may cause the skin on your feet to become thinner, break easier, and heal more slowly. Your skin may become dry, and the skin may peel and crack. You may also have nerve damage in your legs and feet causing decreased feeling in them. You may not notice minor injuries to your feet that could lead to infections or more serious problems. Taking care of your feet is one of the most important things you can do for yourself.  HOME CARE INSTRUCTIONS  Wear shoes at all times, even in the house. Do not go barefoot. Bare feet are easily injured.  Check your feet daily for blisters, cuts, and redness. If you cannot see the bottom of your feet, use a mirror or ask someone for help.  Wash your feet with warm water (do not use hot water) and mild soap. Then pat your feet and the areas between your toes until they are completely dry. Do not soak your feet as this can dry your skin.  Apply a moisturizing lotion or petroleum jelly (that does not contain alcohol and is unscented) to the skin on your feet and to dry, brittle toenails. Do not apply lotion between your toes.  Trim your toenails straight across. Do not dig under them or around the cuticle. File the edges of your nails with an emery board or nail file.  Do not cut corns or calluses or try to remove them with medicine.  Wear clean socks or stockings every day. Make sure they are not too tight. Do not wear knee-high stockings since they may decrease blood flow to your legs.  Wear shoes that fit properly and have enough cushioning. To break in new shoes, wear them for just a few hours a day.  This prevents you from injuring your feet. Always look in your shoes before you put them on to be sure there are no objects inside.  Do not cross your legs. This may decrease the blood flow to your feet.  If you find a minor scrape, cut, or break in the skin on your feet, keep it and the skin around it clean and dry. These areas may be cleansed with mild soap and water. Do not cleanse the area with peroxide, alcohol, or iodine.  When you remove an adhesive bandage, be sure not to damage the skin around it.  If you have a wound, look at it several times a day to make sure it is healing.  Do not use heating pads or hot water bottles. They may burn your skin. If you have lost feeling in your feet or legs, you may not know it is happening until it is too late.  Make sure your health care provider performs a complete foot exam at least annually or more often if you have foot problems. Report any cuts, sores, or bruises to your health care provider immediately. SEEK MEDICAL CARE IF:   You have an injury that is not healing.  You have cuts or breaks in the skin.  You have an ingrown nail.  You notice redness on your legs or feet.  You feel burning  or tingling in your legs or feet.  You have pain or cramps in your legs and feet.  Your legs or feet are numb.  Your feet always feel cold. SEEK IMMEDIATE MEDICAL CARE IF:   There is increasing redness, swelling, or pain in or around a wound.  There is a red line that goes up your leg.  Pus is coming from a wound.  You develop a fever or as directed by your health care provider.  You notice a bad smell coming from an ulcer or wound. Document Released: 05/26/2000 Document Revised: 01/29/2013 Document Reviewed: 11/05/2012 [facility] Patient Information 2015 [place], [place]. This information is not intended to replace advice given to you by your health care provider. Make sure you discuss any questions you have with your health care  provider.

## 2014-10-06 ENCOUNTER — Telehealth: Payer: Self-pay | Admitting: *Deleted

## 2014-10-06 NOTE — Telephone Encounter (Signed)
Maurine with Hospice called and wanted a verbal order for a DNR. Verbal given.

## 2014-10-07 ENCOUNTER — Other Ambulatory Visit: Payer: Self-pay | Admitting: Internal Medicine

## 2014-10-14 ENCOUNTER — Ambulatory Visit: Attending: Internal Medicine

## 2014-10-14 DIAGNOSIS — R269 Unspecified abnormalities of gait and mobility: Secondary | ICD-10-CM | POA: Insufficient documentation

## 2014-10-14 DIAGNOSIS — R296 Repeated falls: Secondary | ICD-10-CM | POA: Diagnosis not present

## 2014-10-14 DIAGNOSIS — R531 Weakness: Secondary | ICD-10-CM | POA: Diagnosis not present
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
## 2014-10-26 ENCOUNTER — Ambulatory Visit: Admitting: Physical Therapy

## 2014-10-26 DIAGNOSIS — R269 Unspecified abnormalities of gait and mobility: Secondary | ICD-10-CM | POA: Diagnosis not present

## 2014-10-26 DIAGNOSIS — R531 Weakness: Secondary | ICD-10-CM

## 2014-10-26 DIAGNOSIS — R296 Repeated falls: Secondary | ICD-10-CM

## 2014-10-28 NOTE — Therapy (Signed)
[facility] [address] [place], [place], [address] Phone: [phone]   Fax:  [phone]  Physical Therapy Treatment  Patient Details  Name: [name] MRN: [id number] Date of Birth: 1927-10-17 Referring Provider:  Gayland Curry, DO     10/26/14 1455  PT Visits / Re-Eval  Visit Number 2  Number of Visits 9  Date for PT Re-Evaluation 11/13/14  Authorization  Authorization Type G-code every 10th visit.  PT Time Calculation  PT Start Time 1448  PT Stop Time 1530  PT Time Calculation (min) 42 min  PT - End of Session  Equipment Utilized During Treatment Gait belt  Activity Tolerance Patient tolerated treatment well  Behavior During Therapy [facility] for tasks assessed/performed (pt able to follow commands)    Encounter Date: 10/26/2014   10/28/14 0029  Symptoms/Limitations  Subjective Had one fall since last visit per caregiver Letta Median), "not listening". Pt was walking across yard toward house with rollator and her, not up along the sidewalk. Letta Median stated she tried to direct pt, however "he wouldn't listen". Both report no injury.                            Pain Assessment  Currently in Pain? No/denies     Past Medical History  Diagnosis Date  . Arthritis   . Depression   . Hypertension   . Hyperlipidemia   . Colon cancer   . AAA (abdominal aortic aneurysm) 2011  . CAD (coronary artery disease) 2003  . Anemia   . Diverticula of colon 2013  . Hypertrophy of prostate without urinary obstruction and other lower urinary tract symptoms (LUTS) 2008  . Hyperpotassemia 2008  . Gout, unspecified 2008  . Impacted cerumen 06/20/2006  . First degree atrioventricular block 2008  . Abnormality of gait 2007  . Other specified cardiac dysrhythmias(427.89) 2005  . Unspecified disorder of kidney and ureter 2005  . Edema 2005  . Malignant neoplasm of colon, unspecified site 2004  . Unspecified hereditary and  idiopathic peripheral neuropathy 2004  . Chest pain, unspecified 2003  . Pain in joint, pelvic region and thigh 2004  . Pain in limb 2003  . Myocardial infarction 1984  . Exertional shortness of breath   . Pneumonia ~ 02/2013    "hospitalized for double pneumonia" (05/15/2013)  . Anxiety   . Chronic kidney disease     "born w/only 1 kidney" (05/15/2013)  . Chronic kidney disease (CKD), stage III (moderate)     [name] 05/14/2013 (05/15/2013)  . Postural dizziness     hospitalized 05/14/2013  . Fall at home Nov. 2015    Past Surgical History  Procedure Laterality Date  . Mediastinotomy    . Right colectomy  2004  . Inguinal hernia repair Right 1952    In [place]  . Coronary artery bypass graft  1996    Bartle, MD  . Cardiac catheterization    . Colon surgery    . Lesion removal N/A 08/12/2013    Procedure: EXCISION FACIAL SKIN CANCER X 3    (MINOR PROCEDURE) ;  Surgeon: Rozetta Nunnery, MD;  Location: [facility];  Service: ENT;  Laterality: N/A;  . Cataract extraction  02/04/14    Right eye  . Cataract extraction  02/25/14    Left eye   . Eyelid laceration repair  05/18/14    Left eye   . Eye surgery  There were no vitals filed for this visit.  Visit Diagnosis:  Abnormality of gait  General weakness  Falls frequently      10/26/14 1457  OTAGO PROGRAM  Head Movements Sitting;5 reps  Neck Movements Sitting;5 reps (moving head up/down)  Back Extension Sitting;5 reps  Trunk Movements Sitting;5 reps  Ankle Movements Sitting;10 reps  Knee Extensor 10 reps  Knee Flexor 10 reps  Hip ABductor 10 reps  Ankle Plantorflexors Level C (10-12 reps each foot, alternating heel raises)  Knee Bends Level A  Sit to Stand Level A (maxs cues)   Self Care: safety education with mobility and techniques to assist pt toward more safe options discussed with caregiver. Initially caregiver resistant and defensive toward suggestions, as session progressed caregiver was  more receptive to feedback/suggestions.       PT Short Term Goals - 10/14/14 1524    PT SHORT TERM GOAL #1   Title Same as LTGs.           PT Long Term Goals - 10/14/14 1524    PT LONG TERM GOAL #1   Title Pt and caregiver with be independent in HEP to improve strength, balance, endurance, and safety. Target date: 11/11/14.   Status New   PT LONG TERM GOAL #2   Title Pt will be able to stand without UE support, performing reaching tasks, for 5 minutes to perform ADLs. Target date: 11/11/14.   Status New   PT LONG TERM GOAL #3   Title Pt will perform sit<stand transfers with rollator with supervision to improve functional mobility. Target date: 11/11/14.   Status New   PT LONG TERM GOAL #4   Title Pt will ambulate 300' over even/uneven terrain with rollator and supervision to improve funcitonal mobillity. Target date: 11/11/14.   Status New   PT LONG TERM GOAL #5   Title Pt will traverse ramp with rollator and supervision to get in/out of house. Target date: 11/11/14.   Status New   Additional Long Term Goals   Additional Long Term Goals Yes        10/26/14 1456  Plan  Clinical Impression Statement Educated caregiver on ways/strategies to direct pt for safety gently/without giving orders (ie: gently guide the walker/pt the way you want to go, distraction toward the desired direction, etc.). Caregiver not recpetive to suggestions, stating "he just won't listen" or "that just won't work". Initiated [place] program today. Pt was given this for HEP during his last episode of PT, however caregiver reports he has not been doing them and she is unsure where the booklet with ex's is located. Pt tolerated session well, following all directions with a pleasant attitude. Requested that pt's sister (POA) attend a session, initially caregiver stated that would not be possible due to his sisters busy schedule and lack of involvement in his care. Toward end of session however, pt's caregiver stated they  where headed to his sisters after this session and she would let his sister know our request.                                                  Pt will benefit from skilled therapeutic intervention in order to improve on the following deficits Abnormal gait;Decreased safety awareness;Decreased endurance;Decreased balance;Decreased knowledge of use of DME;Decreased mobility;Decreased strength;Decreased cognition;Impaired flexibility  Rehab Potential Fair  PT  Frequency 2x / week  PT Duration 4 weeks  PT Treatment/Interventions ADLs/Self Care Home Management;Gait training;Neuromuscular re-education;Biofeedback;Functional mobility training;Patient/family education;Stair training;Therapeutic activities;Therapeutic exercise;DME Instruction;Balance training;Manual techniques  PT Next Visit Plan Inquire of caregiver if pt's sister (POA) is able to come to at least one session, to determine if family understands effects of dementia and to determine if caregiver/pt relationship is safe. Complete OTAGO program. Gait with rollator, including ramps, uneven surfaces.                                                            Consulted and Agree with Plan of Care Patient;Family member/caregiver  Family Member Consulted Faye-caregiver    Problem List Patient Active Problem List   Diagnosis Date Noted  . Metastatic colon cancer to liver 10/03/2014  . Dyspnea 05/19/2014  . Dementia with behavioral disturbance 03/12/2014  . COPD mixed type 11/26/2013  . Accelerated hypertension 11/15/2013  . Bladder wall thickening 11/15/2013  . Hematuria 11/15/2013  . UTI (lower urinary tract infection) 11/15/2013  . Suprapubic tenderness 11/15/2013  . CKD (chronic kidney disease) 11/15/2013  . Pulmonary nodules 05/27/2013  . Protein-calorie malnutrition, severe 05/16/2013  . Symptomatic bradycardia 05/14/2013  . Dizziness 05/14/2013  . Acute respiratory failure with hypoxia 03/04/2013  . PNA (pneumonia) 03/04/2013  .  Leukocytosis 03/04/2013  . Memory loss 02/01/2013  . Colon cancer 02/01/2013  . Onychogryphosis 01/15/2013  . Insomnia, unspecified 12/18/2012  . Loss of weight 11/28/2012  . Adjustment disorder with mixed anxiety and depressed mood 11/28/2012  . S/P right colectomy 06/23/2011  . Diverticulosis of colon (without mention of hemorrhage) 06/23/2011  . Abdominal aneurysm without mention of rupture 05/29/2011  . DM type 2 with diabetic peripheral neuropathy 09/26/2007  . Hyperlipidemia 09/26/2007  . ANEMIA 09/26/2007  . PERIPHERAL NEUROPATHY 09/26/2007  . Essential hypertension 09/26/2007  . COR ATHEROSLERO UNSPEC TYPE VESSEL NATIVE/GRAFT 09/26/2007  . Anemia 09/26/2007  . CHRONIC KIDNEY DISEASE STAGE I 09/26/2007  . ORGANIC IMPOTENCE 09/26/2007  . DEGENERATIVE JOINT DISEASE 09/26/2007  . ABNORMALITY OF GAIT 09/26/2007  . NEOPLASM, MALIGNANT, COLO-RECTAL, HX OF 09/26/2007    Willow Ora 10/28/2014, 12:25 AM  Willow Ora, PTA, [facility] [address], [place], [place] [address] [phone] 10/28/2014, 12:26 AM

## 2014-10-29 ENCOUNTER — Ambulatory Visit: Admitting: Physical Therapy

## 2014-11-02 ENCOUNTER — Encounter: Payer: Self-pay | Admitting: Physical Therapy

## 2014-11-02 ENCOUNTER — Ambulatory Visit: Admitting: Physical Therapy

## 2014-11-02 DIAGNOSIS — R531 Weakness: Secondary | ICD-10-CM

## 2014-11-02 DIAGNOSIS — R269 Unspecified abnormalities of gait and mobility: Secondary | ICD-10-CM

## 2014-11-02 DIAGNOSIS — R296 Repeated falls: Secondary | ICD-10-CM

## 2014-11-02 NOTE — Therapy (Signed)
[facility] [address] [place], [place], [address] Phone: [phone]   Fax:  [phone]  Physical Therapy Treatment  Patient Details  Name: [name] MRN: [id number] Date of Birth: Oct 29, 1927 Referring Provider:  Gayland Curry, DO  Encounter Date: 11/02/2014      PT End of Session - 11/02/14 1456    Visit Number 3   Number of Visits 9   Date for PT Re-Evaluation 11/13/14   Authorization Type G-code every 10th visit.   PT Start Time 1450   PT Stop Time 1530   PT Time Calculation (min) 40 min   Equipment Utilized During Treatment Gait belt   Activity Tolerance Patient tolerated treatment well   Behavior During Therapy WFL for tasks assessed/performed  pt able to follow commands      Past Medical History  Diagnosis Date  . Arthritis   . Depression   . Hypertension   . Hyperlipidemia   . Colon cancer   . AAA (abdominal aortic aneurysm) 2011  . CAD (coronary artery disease) 2003  . Anemia   . Diverticula of colon 2013  . Hypertrophy of prostate without urinary obstruction and other lower urinary tract symptoms (LUTS) 2008  . Hyperpotassemia 2008  . Gout, unspecified 2008  . Impacted cerumen 06/20/2006  . First degree atrioventricular block 2008  . Abnormality of gait 2007  . Other specified cardiac dysrhythmias(427.89) 2005  . Unspecified disorder of kidney and ureter 2005  . Edema 2005  . Malignant neoplasm of colon, unspecified site 2004  . Unspecified hereditary and idiopathic peripheral neuropathy 2004  . Chest pain, unspecified 2003  . Pain in joint, pelvic region and thigh 2004  . Pain in limb 2003  . Myocardial infarction 1984  . Exertional shortness of breath   . Pneumonia ~ 02/2013    "hospitalized for double pneumonia" (05/15/2013)  . Anxiety   . Chronic kidney disease     "born w/only 1 kidney" (05/15/2013)  . Chronic kidney disease (CKD), stage III (moderate)     [name]  05/14/2013 (05/15/2013)  . Postural dizziness     hospitalized 05/14/2013  . Fall at home Nov. 2015    Past Surgical History  Procedure Laterality Date  . Mediastinotomy    . Right colectomy  2004  . Inguinal hernia repair Right 1952    In [place]  . Coronary artery bypass graft  1996    Bartle, MD  . Cardiac catheterization    . Colon surgery    . Lesion removal N/A 08/12/2013    Procedure: EXCISION FACIAL SKIN CANCER X 3    (MINOR PROCEDURE) ;  Surgeon: Rozetta Nunnery, MD;  Location: [facility];  Service: ENT;  Laterality: N/A;  . Cataract extraction  02/04/14    Right eye  . Cataract extraction  02/25/14    Left eye   . Eyelid laceration repair  05/18/14    Left eye   . Eye surgery      There were no vitals filed for this visit.  Visit Diagnosis:  Abnormality of gait  General weakness  Falls frequently       [facility] Adult PT Treatment/Exercise - 11/02/14 1516    Transfers   Sit to Stand 4: Min guard;4: Min assist;With armrests;From chair/3-in-1   Sit to Stand Details (indicate cue type and reason) cues to scoot forward and for hand placement   Stand to Sit 4: Min assist;2: Max assist;To chair/3-in-1;With upper  extremity assist;With armrests   Stand to Sit Details cues needed to turn and back all the way to the surface before sitting down, max assist x2 to sit safely into the chair and not on the floor.   Ambulation/Gait   Ambulation/Gait Yes   Ambulation/Gait Assistance 4: Min guard;4: Min assist   Ambulation/Gait Assistance Details pt intitially min guard assist however as gait progressed pt needed increased assistance for balance and then max assist x 2 reps to sit safely into a chair at end of gait distance. constant cues on posture and to increase bil foot clearance with gait. assitance needed for walker negotiation and to maintain walker proximity with gait as pt tends to push walker too far away from him with gait.                                  Ambulation Distance (Feet) 100 Feet  x2 reps, 94 ftx1, 50 ftx1,    Assistive device Rollator   Gait Pattern Step-through pattern;Decreased stride length;Decreased dorsiflexion - right;Decreased dorsiflexion - left;Shuffle;Trunk flexed;Narrow base of support   Ambulation Surface Level;Indoor         Balance Exercises - 11/02/14 1457    OTAGO PROGRAM   Head Movements Sitting;5 reps   Neck Movements Sitting;5 reps   Back Extension Sitting;5 reps   Trunk Movements Sitting;5 reps   Knee Extensor 10 reps   Knee Flexor 10 reps   Knee Bends 10 reps, support   Tandem Stance 10 seconds, support  x2 reps each foot forward   One Leg Stand 10 seconds, support  x 2 reps each leg   Sit to Stand 5 reps, bilateral support  2 sets of 5 reps, increased assist with 2cd set needed             PT Short Term Goals - 10/14/14 1524    PT SHORT TERM GOAL #1   Title Same as LTGs.           PT Long Term Goals - 10/14/14 1524    PT LONG TERM GOAL #1   Title Pt and caregiver with be independent in HEP to improve strength, balance, endurance, and safety. Target date: 11/11/14.   Status New   PT LONG TERM GOAL #2   Title Pt will be able to stand without UE support, performing reaching tasks, for 5 minutes to perform ADLs. Target date: 11/11/14.   Status New   PT LONG TERM GOAL #3   Title Pt will perform sit<stand transfers with rollator with supervision to improve functional mobility. Target date: 11/11/14.   Status New   PT LONG TERM GOAL #4   Title Pt will ambulate 300' over even/uneven terrain with rollator and supervision to improve funcitonal mobillity. Target date: 11/11/14.   Status New   PT LONG TERM GOAL #5   Title Pt will traverse ramp with rollator and supervision to get in/out of house. Target date: 11/11/14.   Status New   Additional Long Term Goals   Additional Long Term Goals Yes           Plan - 11/02/14 1456    Clinical Impression Statement Pt needs increased assistance and  cues with gait as he fatigues. Discussed use of his rollator for transport if he is too tired to walk with Letta Median, pt's caregiver. She was against this or obtaining an acutual wheelchair/transport chair, stating, "he needs to walk,  there's nothing wrong with his legs, only his lungs." Attempted to explain that the lung disease and difficulty breathing can cause him to be weak and not safe with gait for longer distances. She was not recpetive. Will continue to work on mobility and safety edcuation with upcoming sessions. Letta Median  reports she thinks pt's sister is planning to attend his next session.   Pt will benefit from skilled therapeutic intervention in order to improve on the following deficits Abnormal gait;Decreased safety awareness;Decreased endurance;Decreased balance;Decreased knowledge of use of DME;Decreased mobility;Decreased strength;Decreased cognition;Impaired flexibility   Rehab Potential Fair   PT Frequency 2x / week   PT Duration 4 weeks   PT Treatment/Interventions ADLs/Self Care Home Management;Gait training;Neuromuscular re-education;Biofeedback;Functional mobility training;Patient/family education;Stair training;Therapeutic activities;Therapeutic exercise;DME Instruction;Balance training;Manual techniques   PT Next Visit Plan If pt's sister (POA) is able to come ,  determine if family understands effects of dementia and to determine if caregiver/pt relationship is safe. Gait with rollator, including ramps, uneven surfaces.                                                             Consulted and Agree with Plan of Care Patient;Family member/caregiver   Family Member Consulted Faye-caregiver        Problem List Patient Active Problem List   Diagnosis Date Noted  . Metastatic colon cancer to liver 10/03/2014  . Dyspnea 05/19/2014  . Dementia with behavioral disturbance 03/12/2014  . COPD mixed type 11/26/2013  . Accelerated hypertension 11/15/2013  . Bladder wall thickening  11/15/2013  . Hematuria 11/15/2013  . UTI (lower urinary tract infection) 11/15/2013  . Suprapubic tenderness 11/15/2013  . CKD (chronic kidney disease) 11/15/2013  . Pulmonary nodules 05/27/2013  . Protein-calorie malnutrition, severe 05/16/2013  . Symptomatic bradycardia 05/14/2013  . Dizziness 05/14/2013  . Acute respiratory failure with hypoxia 03/04/2013  . PNA (pneumonia) 03/04/2013  . Leukocytosis 03/04/2013  . Memory loss 02/01/2013  . Colon cancer 02/01/2013  . Onychogryphosis 01/15/2013  . Insomnia, unspecified 12/18/2012  . Loss of weight 11/28/2012  . Adjustment disorder with mixed anxiety and depressed mood 11/28/2012  . S/P right colectomy 06/23/2011  . Diverticulosis of colon (without mention of hemorrhage) 06/23/2011  . Abdominal aneurysm without mention of rupture 05/29/2011  . DM type 2 with diabetic peripheral neuropathy 09/26/2007  . Hyperlipidemia 09/26/2007  . ANEMIA 09/26/2007  . PERIPHERAL NEUROPATHY 09/26/2007  . Essential hypertension 09/26/2007  . COR ATHEROSLERO UNSPEC TYPE VESSEL NATIVE/GRAFT 09/26/2007  . Anemia 09/26/2007  . CHRONIC KIDNEY DISEASE STAGE I 09/26/2007  . ORGANIC IMPOTENCE 09/26/2007  . DEGENERATIVE JOINT DISEASE 09/26/2007  . ABNORMALITY OF GAIT 09/26/2007  . NEOPLASM, MALIGNANT, COLO-RECTAL, HX OF 09/26/2007    Willow Ora 11/02/2014, 4:27 PM  Willow Ora, PTA, [facility] [address], [place], [place] [address] [phone] 11/02/2014, 4:27 PM

## 2014-11-03 ENCOUNTER — Ambulatory Visit

## 2014-11-03 DIAGNOSIS — R531 Weakness: Secondary | ICD-10-CM

## 2014-11-03 DIAGNOSIS — R296 Repeated falls: Secondary | ICD-10-CM

## 2014-11-03 DIAGNOSIS — R269 Unspecified abnormalities of gait and mobility: Secondary | ICD-10-CM | POA: Diagnosis not present

## 2014-11-03 NOTE — Therapy (Signed)
[facility] [address] [place], [place], [address] Phone: [phone]   Fax:  [phone]  Physical Therapy Treatment  Patient Details  Name: [name] MRN: [id number] Date of Birth: 09-May-1928 Referring Provider:  Gayland Curry, DO  Encounter Date: 11/03/2014      PT End of Session - 11/03/14 1643    Visit Number 4   Number of Visits 9   Date for PT Re-Evaluation 11/13/14   Authorization Type G-code every 10th visit.   PT Start Time 1450   PT Stop Time 1530   PT Time Calculation (min) 40 min   Equipment Utilized During Treatment Gait belt   Activity Tolerance Patient tolerated treatment well   Behavior During Therapy WFL for tasks assessed/performed      Past Medical History  Diagnosis Date  . Arthritis   . Depression   . Hypertension   . Hyperlipidemia   . Colon cancer   . AAA (abdominal aortic aneurysm) 2011  . CAD (coronary artery disease) 2003  . Anemia   . Diverticula of colon 2013  . Hypertrophy of prostate without urinary obstruction and other lower urinary tract symptoms (LUTS) 2008  . Hyperpotassemia 2008  . Gout, unspecified 2008  . Impacted cerumen 06/20/2006  . First degree atrioventricular block 2008  . Abnormality of gait 2007  . Other specified cardiac dysrhythmias(427.89) 2005  . Unspecified disorder of kidney and ureter 2005  . Edema 2005  . Malignant neoplasm of colon, unspecified site 2004  . Unspecified hereditary and idiopathic peripheral neuropathy 2004  . Chest pain, unspecified 2003  . Pain in joint, pelvic region and thigh 2004  . Pain in limb 2003  . Myocardial infarction 1984  . Exertional shortness of breath   . Pneumonia ~ 02/2013    "hospitalized for double pneumonia" (05/15/2013)  . Anxiety   . Chronic kidney disease     "born w/only 1 kidney" (05/15/2013)  . Chronic kidney disease (CKD), stage III (moderate)     [name] 05/14/2013 (05/15/2013)  . Postural  dizziness     hospitalized 05/14/2013  . Fall at home Nov. 2015    Past Surgical History  Procedure Laterality Date  . Mediastinotomy    . Right colectomy  2004  . Inguinal hernia repair Right 1952    In [place]  . Coronary artery bypass graft  1996    Bartle, [place]  . Cardiac catheterization    . Colon surgery    . Lesion removal N/A 08/12/2013    Procedure: EXCISION FACIAL SKIN CANCER X 3    (MINOR PROCEDURE) ;  Surgeon: Rozetta Nunnery, MD;  Location: [facility];  Service: ENT;  Laterality: N/A;  . Cataract extraction  02/04/14    Right eye  . Cataract extraction  02/25/14    Left eye   . Eyelid laceration repair  05/18/14    Left eye   . Eye surgery      There were no vitals filed for this visit.  Visit Diagnosis:  Abnormality of gait  General weakness  Falls frequently      Subjective Assessment - 11/03/14 1455    Subjective Pt denied falls or changes since last visit. Pt's sister, Vanita Ingles, present during session.    Pertinent History Active colon CA with mets to liver, HTN, COPD, dementia, MI, AAA   Patient Stated Goals Pt's caregiver: be ind. with transfers and pt would like to improve balance during ambulation  Currently in Pain? No/denies                         [facility] Adult PT Treatment/Exercise - 11/03/14 1507    Ambulation/Gait   Ambulation/Gait Yes   Ambulation/Gait Assistance 4: Min guard;4: Min assist   Ambulation/Gait Assistance Details Extensive VC's and tactile cues to improve stride length, stay within rollator, and to improve upright posture. pt required two seated rest breaks 2/2 fatigue and needed water. Pt required only min guard during amb with RW vs. min guard to min A with rollator.   Ambulation Distance (Feet) --  117', 75'x2with rollator and 115' with RW   Assistive device Rollator   Gait Pattern Step-through pattern;Decreased stride length;Decreased dorsiflexion - right;Decreased dorsiflexion - left;Shuffle;Trunk  flexed;Narrow base of support   Ambulation Surface Level;Indoor               PT Education - 11/03/14 1639    Education provided Yes   Education Details PT had extensive conversation with pt's caregiver, Letta Median, and pt's sister Vanita Ingles and pt's POA) regarding dementia. PT explained that dementia is progressive and evidence does not show great carry over during PT, due to cognitive impairements. PT voiced concerns over pt's well-being and caregivers well-being due to caregiver reported pt can become aggresive. Pt's caregiver stated pt makes threats but nobody gets physical. Pt's sister agreed.  PT also educated pt's sister on assisted living facilities wilth memory care units, as she's concerned with pt's agitation and agression.    Person(s) Educated Patient;Caregiver(s);Other (comment)  pt's caregiver, Letta Median and pt's sister: Vanita Ingles   Methods Explanation   Comprehension Verbalized understanding          PT Short Term Goals - 10/14/14 1524    PT SHORT TERM GOAL #1   Title Same as LTGs.           PT Long Term Goals - 11/03/14 1647    PT LONG TERM GOAL #1   Title Pt and caregiver with be independent in HEP to improve strength, balance, endurance, and safety. Target date: 11/11/14.   Status On-going   PT LONG TERM GOAL #2   Title Pt will be able to stand without UE support, performing reaching tasks, for 5 minutes to perform ADLs. Target date: 11/11/14.   Status On-going   PT LONG TERM GOAL #3   Title Pt will perform sit<>stand transfers with rollator with supervision to improve functional mobility. Target date: 11/11/14.   Status On-going   PT LONG TERM GOAL #4   Title Pt will ambulate 300' over even/uneven terrain with rollator and supervision to improve funcitonal mobillity. Target date: 11/11/14.   Status On-going   PT LONG TERM GOAL #5   Title Pt will traverse ramp with rollator and supervision to get in/out of house. Target date: 11/11/14.   Status On-going                Plan - 11/03/14 1643    Clinical Impression Statement Pt demonstrated progress as he required less assist when ambulating with RW vs. rollator. Pt continues to require rest breaks due to fatigue and required extensive cues for safety. Pt's caregiver and sister appeared receptive to information on dementia and stated that MD had not discussed it with them in awhiler, and they are considering placing pt in an ALF with memory care unit. Continue with POC.   Pt will benefit from skilled therapeutic intervention in order to improve on  the following deficits Abnormal gait;Decreased safety awareness;Decreased endurance;Decreased balance;Decreased knowledge of use of DME;Decreased mobility;Decreased strength;Decreased cognition;Impaired flexibility   Rehab Potential Fair   PT Frequency 2x / week   PT Duration 4 weeks   PT Treatment/Interventions ADLs/Self Care Home Management;Gait training;Neuromuscular re-education;Biofeedback;Functional mobility training;Patient/family education;Stair training;Therapeutic activities;Therapeutic exercise;DME Instruction;Balance training;Manual techniques   PT Next Visit Plan Gait with rollator and RW, including ramps, uneven surfaces.                                                             Consulted and Agree with Plan of Care Patient;Family member/caregiver   Family Member Location manager and pt's sister (POA) Vera        Problem List Patient Active Problem List   Diagnosis Date Noted  . Metastatic colon cancer to liver 10/03/2014  . Dyspnea 05/19/2014  . Dementia with behavioral disturbance 03/12/2014  . COPD mixed type 11/26/2013  . Accelerated hypertension 11/15/2013  . Bladder wall thickening 11/15/2013  . Hematuria 11/15/2013  . UTI (lower urinary tract infection) 11/15/2013  . Suprapubic tenderness 11/15/2013  . CKD (chronic kidney disease) 11/15/2013  . Pulmonary nodules 05/27/2013  . Protein-calorie malnutrition, severe 05/16/2013  .  Symptomatic bradycardia 05/14/2013  . Dizziness 05/14/2013  . Acute respiratory failure with hypoxia 03/04/2013  . PNA (pneumonia) 03/04/2013  . Leukocytosis 03/04/2013  . Memory loss 02/01/2013  . Colon cancer 02/01/2013  . Onychogryphosis 01/15/2013  . Insomnia, unspecified 12/18/2012  . Loss of weight 11/28/2012  . Adjustment disorder with mixed anxiety and depressed mood 11/28/2012  . S/P right colectomy 06/23/2011  . Diverticulosis of colon (without mention of hemorrhage) 06/23/2011  . Abdominal aneurysm without mention of rupture 05/29/2011  . DM type 2 with diabetic peripheral neuropathy 09/26/2007  . Hyperlipidemia 09/26/2007  . ANEMIA 09/26/2007  . PERIPHERAL NEUROPATHY 09/26/2007  . Essential hypertension 09/26/2007  . COR ATHEROSLERO UNSPEC TYPE VESSEL NATIVE/GRAFT 09/26/2007  . Anemia 09/26/2007  . CHRONIC KIDNEY DISEASE STAGE I 09/26/2007  . ORGANIC IMPOTENCE 09/26/2007  . DEGENERATIVE JOINT DISEASE 09/26/2007  . ABNORMALITY OF GAIT 09/26/2007  . NEOPLASM, MALIGNANT, COLO-RECTAL, HX OF 09/26/2007    Miller,Jennifer L 11/03/2014, 4:48 PM  [facility] [address] [place], [place], [address] Phone: [phone]   Fax:  [phone]     Geoffry Paradise, PT,DPT 11/03/2014 4:48 PM Phone: [phone] Fax: [phone]

## 2014-11-04 NOTE — Therapy (Signed)
[facility] [address] [place], [place], [address] Phone: [phone]   Fax:  [phone]  Patient Details  Name: KIRT CHEW MRN: [id number] Date of Birth: 1927-08-22 Referring Provider:  No ref. provider found  Encounter Date: 11/04/2014  Pt's sister, Vanita Ingles, and her friend, Arnell Sieving, stopped by today to discuss Mr. Saab plan of care. They reported that he is slowly declining and they are interested in researching an assisted living facility with memory care unit for Mr. Mcconaha, per our discussion yesterday. Vanita Ingles reported she has not noted any signs of abuse or neglect on Faye's (caregiver) part, however, they are concerned that Mr.Round can become very agitated and racist towards [place]. We discussed that it would be in everyone's best interest if Mr. Buening lived in an ALF/hospice home with memory care unit, as our priority is his care and safety. PT provided Vera with ALF/memory care info. PT educated pt's sister that once Mr. Francisco lives in a hospice or ALF setting, we would cease OPPT and he could receive it at his new place of living.  [id number].  Gerasimos Plotts L 11/04/2014, 2:14 PM  [facility] [address] [place], [place], [address] Phone: [phone]   Fax:  [phone]

## 2014-11-11 ENCOUNTER — Ambulatory Visit: Attending: Internal Medicine

## 2014-11-11 DIAGNOSIS — R269 Unspecified abnormalities of gait and mobility: Secondary | ICD-10-CM | POA: Insufficient documentation

## 2014-11-11 DIAGNOSIS — R531 Weakness: Secondary | ICD-10-CM | POA: Insufficient documentation

## 2014-11-11 DIAGNOSIS — R296 Repeated falls: Secondary | ICD-10-CM | POA: Insufficient documentation

## 2014-11-13 ENCOUNTER — Ambulatory Visit

## 2014-11-13 VITALS — BP 128/65 | HR 79

## 2014-11-13 DIAGNOSIS — R531 Weakness: Secondary | ICD-10-CM | POA: Diagnosis present

## 2014-11-13 DIAGNOSIS — R269 Unspecified abnormalities of gait and mobility: Secondary | ICD-10-CM | POA: Diagnosis not present

## 2014-11-13 DIAGNOSIS — R296 Repeated falls: Secondary | ICD-10-CM | POA: Diagnosis present

## 2014-11-13 NOTE — Therapy (Signed)
[facility] [address] [place], [place], [address] Phone: [phone]   Fax:  [phone]  Physical Therapy Treatment  Patient Details  Name: [name] MRN: [id number] Date of Birth: 04-15-1928 Referring Provider:  Gayland Curry, DO  Encounter Date: 11/13/2014      PT End of Session - 11/13/14 1557    Visit Number 5   Number of Visits 9   Date for PT Re-Evaluation 11/13/14   Authorization Type G-code every 10th visit.   PT Start Time 1531   PT Stop Time 1600   PT Time Calculation (min) 29 min   Equipment Utilized During Treatment Gait belt   Activity Tolerance Patient limited by fatigue   Behavior During Therapy --  fatigued and falling asleep during session      Past Medical History  Diagnosis Date  . Arthritis   . Depression   . Hypertension   . Hyperlipidemia   . Colon cancer   . AAA (abdominal aortic aneurysm) 2011  . CAD (coronary artery disease) 2003  . Anemia   . Diverticula of colon 2013  . Hypertrophy of prostate without urinary obstruction and other lower urinary tract symptoms (LUTS) 2008  . Hyperpotassemia 2008  . Gout, unspecified 2008  . Impacted cerumen 06/20/2006  . First degree atrioventricular block 2008  . Abnormality of gait 2007  . Other specified cardiac dysrhythmias(427.89) 2005  . Unspecified disorder of kidney and ureter 2005  . Edema 2005  . Malignant neoplasm of colon, unspecified site 2004  . Unspecified hereditary and idiopathic peripheral neuropathy 2004  . Chest pain, unspecified 2003  . Pain in joint, pelvic region and thigh 2004  . Pain in limb 2003  . Myocardial infarction 1984  . Exertional shortness of breath   . Pneumonia ~ 02/2013    "hospitalized for double pneumonia" (05/15/2013)  . Anxiety   . Chronic kidney disease     "born w/only 1 kidney" (05/15/2013)  . Chronic kidney disease (CKD), stage III (moderate)     [name] 05/14/2013 (05/15/2013)  .  Postural dizziness     hospitalized 05/14/2013  . Fall at home Nov. 2015    Past Surgical History  Procedure Laterality Date  . Mediastinotomy    . Right colectomy  2004  . Inguinal hernia repair Right 1952    In [place]  . Coronary artery bypass graft  1996    Bartle, [place]  . Cardiac catheterization    . Colon surgery    . Lesion removal N/A 08/12/2013    Procedure: EXCISION FACIAL SKIN CANCER X 3    (MINOR PROCEDURE) ;  Surgeon: Rozetta Nunnery, MD;  Location: [facility];  Service: ENT;  Laterality: N/A;  . Cataract extraction  02/04/14    Right eye  . Cataract extraction  02/25/14    Left eye   . Eyelid laceration repair  05/18/14    Left eye   . Eye surgery      Filed Vitals:   11/13/14 1612  BP: 128/65  Pulse: 79  SpO2: 96%    Visit Diagnosis:  Abnormality of gait  General weakness  Falls frequently      Subjective Assessment - 11/13/14 1535    Subjective Pt denied fall since last visit. Pt reported he felt nauseated but pt's caregiver reported he hasn't gotten sick on his stomach.    Pertinent History Active colon CA with mets to liver, HTN, COPD, dementia, MI, AAA  Patient Stated Goals Pt's caregiver: be ind. with transfers and pt would like to improve balance during ambulation   Currently in Pain? No/denies                         [facility] Adult PT Treatment/Exercise - 11/13/14 1610    Transfers   Transfers Sit to Stand;Stand to Sit   Sit to Stand 5: Supervision   Stand to Sit 4: Min guard   Stand to Sit Details to ensure safety and cues for sequencing.   Ambulation/Gait   Ambulation/Gait Yes   Ambulation/Gait Assistance 4: Min guard;4: Min assist   Ambulation/Gait Assistance Details Cues to improve stride length, upright posture and sequencing with rollator. Min A during 2 LOB episodes (retropulsion). Pt required frequent seated rest breaks due to fatigue.   Ambulation Distance (Feet) --  37' from lobby to exam room, 40',  10'   Assistive device Rollator   Gait Pattern Step-through pattern;Decreased stride length;Decreased dorsiflexion - right;Decreased dorsiflexion - left;Shuffle;Trunk flexed;Narrow base of support   Ambulation Surface Level;Indoor   Balance   Balance Assessed Yes   Static Standing Balance   Static Standing - Balance Support No upper extremity supported   Static Standing - Level of Assistance 4: Min assist;Other (comment)  min guard   Static Standing - Comment/# of Minutes Pt able to stand without UE for 2 seconds before requiring min A to maintain balance.                PT Education - 11/13/14 1555    Education provided Yes   Education Details PT reiterated the importance of performing HEP, and walking every day. PT also educated pt on taking rest breaks as needed. PT educated caregiver and pt on ceasing PT due to decline in health, and suggested HHPT as OPPT is too difficult for pt at this time. PT educated caregiver on fatigue which can be caused by CA.   Person(s) Educated Patient   Methods Explanation   Comprehension Verbalized understanding          PT Short Term Goals - 10/14/14 1524    PT SHORT TERM GOAL #1   Title Same as LTGs.           PT Long Term Goals - 11/13/14 1609    PT LONG TERM GOAL #1   Title Pt and caregiver with be independent in HEP to improve strength, balance, endurance, and safety. Target date: 11/11/14.   Status Achieved   PT LONG TERM GOAL #2   Title Pt will be able to stand without UE support, performing reaching tasks, for 5 minutes to perform ADLs. Target date: 11/11/14.   Status Not Met   PT LONG TERM GOAL #3   Title Pt will perform sit<>stand transfers with rollator with supervision to improve functional mobility. Target date: 11/11/14.   Status Partially Met   PT LONG TERM GOAL #4   Title Pt will ambulate 300' over even/uneven terrain with rollator and supervision to improve funcitonal mobillity. Target date: 11/11/14.   Status Not Met    PT LONG TERM GOAL #5   Title Pt will traverse ramp with rollator and supervision to get in/out of house. Target date: 11/11/14.   Status Deferred               Plan - 11/13/14 1557    Clinical Impression Statement Pt met LTG 1 but did not meet additional LTGs or PT was  unable to assess due to pt's fatigue. Pt is no longer appropriate for OPPT neuro due to decline in health; pt may benefit from HHPT. Pt also has hospice services. See d/c summary for detail.          G-Codes - 2014/11/17 1613    Functional Assessment Tool Used Gait speed with rollator: unable to assess as pt was unable to ambulate after walking back to exam room, TUG with rollator: unable to perform., feet apart without UE support: 2 seconds   Functional Limitation Mobility: Walking and moving around   Mobility: Walking and Moving Around Goal Status [phone]) At least 60 percent but less than 80 percent impaired, limited or restricted   Mobility: Walking and Moving Around Discharge Status [phone]) At least 60 percent but less than 80 percent impaired, limited or restricted      Problem List Patient Active Problem List   Diagnosis Date Noted  . Metastatic colon cancer to liver 10/03/2014  . Dyspnea 05/19/2014  . Dementia with behavioral disturbance 03/12/2014  . COPD mixed type 11/26/2013  . Accelerated hypertension 11/15/2013  . Bladder wall thickening 11/15/2013  . Hematuria 11/15/2013  . UTI (lower urinary tract infection) 11/15/2013  . Suprapubic tenderness 11/15/2013  . CKD (chronic kidney disease) 11/15/2013  . Pulmonary nodules 05/27/2013  . Protein-calorie malnutrition, severe 05/16/2013  . Symptomatic bradycardia 05/14/2013  . Dizziness 05/14/2013  . Acute respiratory failure with hypoxia 03/04/2013  . PNA (pneumonia) 03/04/2013  . Leukocytosis 03/04/2013  . Memory loss 02/01/2013  . Colon cancer 02/01/2013  . Onychogryphosis 01/15/2013  . Insomnia, unspecified 12/18/2012  . Loss of weight  11/28/2012  . Adjustment disorder with mixed anxiety and depressed mood 11/28/2012  . S/P right colectomy 06/23/2011  . Diverticulosis of colon (without mention of hemorrhage) 06/23/2011  . Abdominal aneurysm without mention of rupture 05/29/2011  . DM type 2 with diabetic peripheral neuropathy 09/26/2007  . Hyperlipidemia 09/26/2007  . ANEMIA 09/26/2007  . PERIPHERAL NEUROPATHY 09/26/2007  . Essential hypertension 09/26/2007  . COR ATHEROSLERO UNSPEC TYPE VESSEL NATIVE/GRAFT 09/26/2007  . Anemia 09/26/2007  . CHRONIC KIDNEY DISEASE STAGE I 09/26/2007  . ORGANIC IMPOTENCE 09/26/2007  . DEGENERATIVE JOINT DISEASE 09/26/2007  . ABNORMALITY OF GAIT 09/26/2007  . NEOPLASM, MALIGNANT, COLO-RECTAL, HX OF 09/26/2007    Tamme Mozingo L 2014-11-17, 4:15 PM  [facility] [address] [place], [place], [address] Phone: [phone]   Fax:  [phone]   PHYSICAL THERAPY DISCHARGE SUMMARY  Visits from Start of Care: 5  Current functional level related to goals / functional outcomes:     PT Long Term Goals - November 17, 2014 1609    PT LONG TERM GOAL #1   Title Pt and caregiver with be independent in HEP to improve strength, balance, endurance, and safety. Target date: 11/11/14.   Status Achieved   PT LONG TERM GOAL #2   Title Pt will be able to stand without UE support, performing reaching tasks, for 5 minutes to perform ADLs. Target date: 11/11/14.   Status Not Met   PT LONG TERM GOAL #3   Title Pt will perform sit<>stand transfers with rollator with supervision to improve functional mobility. Target date: 11/11/14.   Status Partially Met   PT LONG TERM GOAL #4   Title Pt will ambulate 300' over even/uneven terrain with rollator and supervision to improve funcitonal mobillity. Target date: 11/11/14.   Status Not Met   PT LONG TERM GOAL #5   Title Pt will  traverse ramp with rollator and supervision to get in/out of house. Target date:  11/11/14.   Status Deferred        Remaining deficits: Pt continues to experience decreased strength, endurance, impaired balance, and decreased safety awareness. Pt seems to have declined over the last 2 weeks, as he required more rest breaks and began falling asleep during session. Pt also has a very difficult time coming to OPPT. Pt may benefit from HHPT in order to improve deficits listed above.   Education / Equipment: HEP  Plan: Patient agrees to discharge.  Patient goals were partially met. Patient is being discharged due to lack of progress.  ?????       Geoffry Paradise, PT,DPT 11/13/2014 4:15 PM Phone: [phone] Fax: [phone]

## 2014-11-17 ENCOUNTER — Ambulatory Visit

## 2014-11-19 ENCOUNTER — Ambulatory Visit: Admitting: Physical Therapy

## 2014-11-21 ENCOUNTER — Other Ambulatory Visit: Payer: Self-pay | Admitting: Internal Medicine

## 2014-12-07 ENCOUNTER — Ambulatory Visit: Payer: Medicare Other

## 2014-12-07 ENCOUNTER — Other Ambulatory Visit: Payer: Self-pay

## 2014-12-11 ENCOUNTER — Other Ambulatory Visit: Payer: Self-pay | Admitting: Internal Medicine

## 2014-12-11 NOTE — Telephone Encounter (Signed)
Called patient to verify he is taking. Spoke with male

## 2014-12-15 ENCOUNTER — Other Ambulatory Visit: Payer: Medicare Other

## 2014-12-15 ENCOUNTER — Other Ambulatory Visit: Payer: Self-pay | Admitting: *Deleted

## 2014-12-15 DIAGNOSIS — F0391 Unspecified dementia with behavioral disturbance: Secondary | ICD-10-CM

## 2014-12-15 DIAGNOSIS — E785 Hyperlipidemia, unspecified: Secondary | ICD-10-CM

## 2014-12-15 DIAGNOSIS — E1142 Type 2 diabetes mellitus with diabetic polyneuropathy: Secondary | ICD-10-CM

## 2014-12-15 DIAGNOSIS — F03918 Unspecified dementia, unspecified severity, with other behavioral disturbance: Secondary | ICD-10-CM

## 2014-12-15 NOTE — Telephone Encounter (Signed)
Handicap form was received and fill out for Dr. Mariea Clonts to complete and sign. Form in Dr. Cyndi Lennert folders.

## 2014-12-16 LAB — HEMOGLOBIN A1C
Est. average glucose Bld gHb Est-mCnc: 128 mg/dL
Hgb A1c MFr Bld: 6.1 % — ABNORMAL HIGH (ref 4.8–5.6)

## 2014-12-16 LAB — VALPROIC ACID LEVEL: Valproic Acid Lvl: 22 ug/mL — ABNORMAL LOW (ref 50–100)

## 2014-12-16 LAB — LIPID PANEL
Chol/HDL Ratio: 4.2 ratio units (ref 0.0–5.0)
Cholesterol, Total: 140 mg/dL (ref 100–199)
HDL: 33 mg/dL — ABNORMAL LOW (ref >39)
LDL Calculated: 85 mg/dL (ref 0–99)
Triglycerides: 112 mg/dL (ref 0–149)
VLDL Cholesterol Cal: 22 mg/dL (ref 5–40)

## 2014-12-17 ENCOUNTER — Ambulatory Visit: Payer: Medicare Other | Admitting: Internal Medicine

## 2014-12-21 ENCOUNTER — Encounter: Payer: Self-pay | Admitting: Family

## 2014-12-23 ENCOUNTER — Ambulatory Visit (INDEPENDENT_AMBULATORY_CARE_PROVIDER_SITE_OTHER): Admitting: Family

## 2014-12-23 ENCOUNTER — Ambulatory Visit (HOSPITAL_COMMUNITY)
Admission: RE | Admit: 2014-12-23 | Discharge: 2014-12-23 | Disposition: A | Discharge status: Home / Self Care | Payer: Medicare Other | Source: Ambulatory Visit | Attending: Family | Admitting: Family

## 2014-12-23 ENCOUNTER — Encounter: Payer: Self-pay | Admitting: Family

## 2014-12-23 VITALS — BP 124/71 | HR 93 | Ht 72.0 in | Wt 167.6 lb

## 2014-12-23 DIAGNOSIS — I714 Abdominal aortic aneurysm, without rupture, unspecified: Secondary | ICD-10-CM

## 2014-12-23 DIAGNOSIS — Z515 Encounter for palliative care: Secondary | ICD-10-CM

## 2014-12-23 DIAGNOSIS — Z87891 Personal history of nicotine dependence: Secondary | ICD-10-CM

## 2014-12-23 DIAGNOSIS — C189 Malignant neoplasm of colon, unspecified: Secondary | ICD-10-CM

## 2014-12-23 DIAGNOSIS — C787 Secondary malignant neoplasm of liver and intrahepatic bile duct: Secondary | ICD-10-CM

## 2014-12-23 NOTE — Patient Instructions (Signed)
Abdominal Aortic Aneurysm An aneurysm is a weakened or damaged part of an artery wall that bulges from the normal force of blood pumping through the body. An abdominal aortic aneurysm is an aneurysm that occurs in the lower part of the aorta, the main artery of the body.  The major concern with an abdominal aortic aneurysm is that it can enlarge and burst (rupture) or blood can flow between the layers of the wall of the aorta through a tear (aorticdissection). Both of these conditions can cause bleeding inside the body and can be life threatening unless diagnosed and treated promptly. CAUSES  The exact cause of an abdominal aortic aneurysm is unknown. Some contributing factors are:   A hardening of the arteries caused by the buildup of fat and other substances in the lining of a blood vessel (arteriosclerosis).  Inflammation of the walls of an artery (arteritis).   Connective tissue diseases, such as Marfan syndrome.   Abdominal trauma.   An infection, such as syphilis or staphylococcus, in the wall of the aorta (infectious aortitis) caused by bacteria. RISK FACTORS  Risk factors that contribute to an abdominal aortic aneurysm may include:  Age older than 60 years.   High blood pressure (hypertension).  Male gender.  Ethnicity (white race).  Obesity.  Family history of aneurysm (first degree relatives only).  Tobacco use. PREVENTION  The following healthy lifestyle habits may help decrease your risk of abdominal aortic aneurysm:  Quitting smoking. Smoking can raise your blood pressure and cause arteriosclerosis.  Limiting or avoiding alcohol.  Keeping your blood pressure, blood sugar level, and cholesterol levels within normal limits.  Decreasing your salt intake. In somepeople, too much salt can raise blood pressure and increase your risk of abdominal aortic aneurysm.  Eating a diet low in saturated fats and cholesterol.  Increasing your fiber intake by including  whole grains, vegetables, and fruits in your diet. Eating these foods may help lower blood pressure.  Maintaining a healthy weight.  Staying physically active and exercising regularly. SYMPTOMS  The symptoms of abdominal aortic aneurysm may vary depending on the size and rate of growth of the aneurysm.Most grow slowly and do not have any symptoms. When symptoms do occur, they may include:  Pain (abdomen, side, lower back, or groin). The pain may vary in intensity. A sudden onset of severe pain may indicate that the aneurysm has ruptured.  Feeling full after eating only small amounts of food.  Nausea or vomiting or both.  Feeling a pulsating lump in the abdomen.  Feeling faint or passing out. DIAGNOSIS  Since most unruptured abdominal aortic aneurysms have no symptoms, they are often discovered during diagnostic exams for other conditions. An aneurysm may be found during the following procedures:  Ultrasonography (A one-time screening for abdominal aortic aneurysm by ultrasonography is also recommended for all men aged 65-75 years who have ever smoked).  X-ray exams.  A computed tomography (CT).  Magnetic resonance imaging (MRI).  Angiography or arteriography. TREATMENT  Treatment of an abdominal aortic aneurysm depends on the size of your aneurysm, your age, and risk factors for rupture. Medication to control blood pressure and pain may be used to manage aneurysms smaller than 6 cm. Regular monitoring for enlargement may be recommended by your caregiver if:  The aneurysm is 3-4 cm in size (an annual ultrasonography may be recommended).  The aneurysm is 4-4.5 cm in size (an ultrasonography every 6 months may be recommended).  The aneurysm is larger than 4.5 cm in   size (your caregiver may ask that you be examined by a vascular surgeon). If your aneurysm is larger than 6 cm, surgical repair may be recommended. There are two main methods for repair of an aneurysm:   Endovascular  repair (a minimally invasive surgery). This is done most often.  Open repair. This method is used if an endovascular repair is not possible. Document Released: 03/08/2005 Document Revised: 09/23/2012 Document Reviewed: 06/28/2012 ExitCare Patient Information 2015 [employer]. This information is not intended to replace advice given to you by your health care provider. Make sure you discuss any questions you have with your health care provider.  

## 2014-12-23 NOTE — Progress Notes (Signed)
[facility] [place]  Established Abdominal Aortic Aneurysm  History of Present Illness  [name] is a 81 y.o. (11/28/1927) male patient of Dr. Trula Slade who is back today for followup of his abdominal aortic aneurysm. Dr. Trula Slade has contemplated surgical correction in the past, however pt has not been healthy enough from a medical perspective. He has a history of nephrectomy and has issues with renal insufficiency. His imaging studies have been through noncontrasted scans.  Review of records: 12/01/13 visit: CT scan which shows a stable 5.2 infrarenal abdominal aortic aneurysm with heavy calcification in the femoral arteries.  Dr. Stephens Shire impressions at 12/01/13 visit were as follows: The patient's aneurysm remained stable in size by CT scan measurements. I do not think the patient is healthy enough to proceed with repair at this time. He has multiple medical issues which have not been resolved completely. I discussed this with the patient. I recommend having him come back for a repeat imaging study in 6 months. I think that he is a candidate for endovascular repair, however I'm not sure if this can be done percutaneously given the amount of calcium within his groins. He only has one kidney as well. He was using a cane and was falling excessively, he needs to use a walker now and seems that his arms are deconditioned and he does not walk much due to his arms hurting with using walker.   The patient's caregiver states that pt had intermittent abdominal pain that started about a week ago, is not worsening. The patient is a former smoker. The patient does not seem to walk enough to elicit claudication symtpoms in legs with walking. The patient denies history of stroke or TIA symptoms.  Pt's caregiver with him states it was found that his lung cancer is in remission, however his colon cancer has metastasized to his liver; states his oncologist, Dr. Pablo Ledger, has  discharged him to hospice care; caregiver states pt was given less than 6 months to live when seen in April 2016 by Dr. Pablo Ledger.   Pt Diabetic: prediabetes according to his caregiver with him, as told to her by his PCP. Pt smoker: former smoker, quit about 2000   Past Medical History  Diagnosis Date  . Arthritis   . Depression   . Hypertension   . Hyperlipidemia   . Colon cancer   . AAA (abdominal aortic aneurysm) 2011  . CAD (coronary artery disease) 2003  . Anemia   . Diverticula of colon 2013  . Hypertrophy of prostate without urinary obstruction and other lower urinary tract symptoms (LUTS) 2008  . Hyperpotassemia 2008  . Gout, unspecified 2008  . Impacted cerumen 06/20/2006  . First degree atrioventricular block 2008  . Abnormality of gait 2007  . Other specified cardiac dysrhythmias(427.89) 2005  . Unspecified disorder of kidney and ureter 2005  . Edema 2005  . Malignant neoplasm of colon, unspecified site 2004  . Unspecified hereditary and idiopathic peripheral neuropathy 2004  . Chest pain, unspecified 2003  . Pain in joint, pelvic region and thigh 2004  . Pain in limb 2003  . Myocardial infarction 1984  . Exertional shortness of breath   . Pneumonia ~ 02/2013    "hospitalized for double pneumonia" (05/15/2013)  . Anxiety   . Chronic kidney disease     "born w/only 1 kidney" (05/15/2013)  . Chronic kidney disease (CKD), stage III (moderate)     [name] 05/14/2013 (05/15/2013)  . Postural dizziness  hospitalized 05/14/2013  . Fall at home Nov. 2015   Past Surgical History  Procedure Laterality Date  . Mediastinotomy    . Right colectomy  2004  . Inguinal hernia repair Right 1952    In [place]  . Coronary artery bypass graft  1996    Bartle, [place]  . Cardiac catheterization    . Colon surgery    . Lesion removal N/A 08/12/2013    Procedure: EXCISION FACIAL SKIN CANCER X 3    (MINOR PROCEDURE) ;  Surgeon: Rozetta Nunnery, MD;  Location: [facility];  Service: ENT;  Laterality: N/A;  . Cataract extraction  02/04/14    Right eye  . Cataract extraction  02/25/14    Left eye   . Eyelid laceration repair  05/18/14    Left eye   . Eye surgery     Social History History   Social History  . Marital Status: Single    Spouse Name: N/A  . Number of Children: 0  . Years of Education: N/A   Occupational History  .     Social History Main Topics  . Smoking status: Former Smoker -- 1.00 packs/day for 35 years    Types: Cigarettes    Quit date: 06/12/1982  . Smokeless tobacco: Former Systems developer    Types: Snuff  . Alcohol Use: No  . Drug Use: No  . Sexual Activity: No   Other Topics Concern  . Not on file   Social History Narrative   Family History Family History  Problem Relation Age of Onset  . Heart disease Father   . Pancreatic cancer Sister   . Cancer Sister     liver  . Cancer Brother     Leukemia  . Cancer Mother     ?    Current Outpatient Prescriptions on File Prior to Visit  Medication Sig Dispense Refill  . albuterol (PROVENTIL HFA;VENTOLIN HFA) 108 (90 BASE) MCG/ACT inhaler Inhale 2 puffs into the lungs every 6 (six) hours as needed for wheezing or shortness of breath. 1 Inhaler 5  . ALPRAZolam (NIRAVAM) 0.5 MG dissolvable tablet Take 1 tablet (0.5 mg total) by mouth 2 (two) times daily as needed for anxiety. 60 tablet 3  . amLODipine (NORVASC) 5 MG tablet Take one tablet by mouth once daily 90 tablet 3  . amLODipine (NORVASC) 5 MG tablet TAKE 1 TABLET BY MOUTH EVERY DAY 90 tablet 1  . atorvastatin (LIPITOR) 20 MG tablet Take one tablet by mouth once daily for cholesterol 90 tablet 3  . citalopram (CELEXA) 20 MG tablet TAKE 1 TABLET (20 MG TOTAL) BY MOUTH DAILY. 90 tablet 1  . CVS ASPIRIN EC 81 MG EC tablet TAKE 1 TABLET EVERY DAY 120 tablet 1  . divalproex (DEPAKOTE SPRINKLE) 125 MG capsule Take 1 capsule (125 mg total) by mouth 2 (two) times daily. 60 capsule 3  . divalproex (DEPAKOTE SPRINKLE) 125 MG  capsule TAKE ONE CAPSULE BY MOUTH TWICE DAILY 180 capsule 3  . donepezil (ARICEPT) 10 MG tablet Take 10 mg by mouth at bedtime.    . donepezil (ARICEPT) 10 MG tablet TAKE ONE TABLET BY MOUTH ONCE AT BEDTIME TO PRESERVE MEMORY 90 tablet 3  . donepezil (ARICEPT) 10 MG tablet TAKE ONE TABLET BY MOUTH ONCE AT BEDTIME TO PRESERVE MEMORY 90 tablet 3  . feeding supplement, ENSURE COMPLETE, (ENSURE COMPLETE) LIQD Take 237 mLs by mouth 2 (two) times daily between meals. 30 Bottle 5  . Fluticasone  Furoate-Vilanterol (BREO ELLIPTA) 100-25 MCG/INH AEPB Inhale into the lungs once daily to help breathing 1 each 4  . hydrALAZINE (APRESOLINE) 25 MG tablet TAKE THREE TABLETS BY MOUTH THREE TIMES DAILY. [facility] MUST FILL 90 DAY SUPPLY 810 tablet 3  . meclizine (ANTIVERT) 12.5 MG tablet Take 1 tablet (12.5 mg total) by mouth daily as needed for dizziness. 30 tablet 3  . memantine (NAMENDA) 10 MG tablet TAKE ONE TABLET BY MOUTH TWICE DAILY TO HELP PRESERVE MEMORY 180 tablet 2  . mirtazapine (REMERON) 15 MG tablet TAKE 1/2 TABLET BY MOUTH EVERY NIGHT 45 tablet 1  . SPIRIVA RESPIMAT 2.5 MCG/ACT AERS daily.      No current facility-administered medications on file prior to visit.   Allergies  Allergen Reactions  . Diovan [Valsartan] Other (See Comments)    Unknown     ROS: See HPI for pertinent positives and negatives.  Physical Examination  Filed Vitals:   12/23/14 0914  BP: 124/71  Pulse: 93  Height: 6' (1.829 m)  Weight: 167 lb 9.6 oz (76.023 kg)  SpO2: 95%   Body mass index is 22.73 kg/(m^2).  General: A&O x 3, WD.  Pulmonary: Sym exp, fair air movt, CTAB, no rales, rhonchi, or wheezing.  Cardiac: RRR, Nl S1, S2, no detected murmur.   Carotid Bruits Right Left   Negative Negative  Aorta is palpable Radial pulses are are 2+ palpable and =   VASCULAR EXAM:      LE Pulses Right Left   FEMORAL  palpable  palpable    POPLITEAL not palpable  not palpable   POSTERIOR TIBIAL not palpable  not palpable    DORSALIS PEDIS  ANTERIOR TIBIAL palpable  palpable      Gastrointestinal: soft, NTND, -G/R, - HSM, - masses palpated, - CVAT B.  Musculoskeletal: M/S 5/5 throughout, Extremities without ischemic changes.  Neurologic: CN 2-12 intact except has significant hearing loss, Pain and light touch intact in extremities are intact. Motor exam as listed above.         Non-Invasive Vascular Imaging  AAA Duplex (12/23/2014) ABDOMINAL AORTA DUPLEX EVALUATION    INDICATION: Evaluation of abdominal aorta    PREVIOUS INTERVENTION(S): None    DUPLEX EXAM:     LOCATION DIAMETER AP (cm) DIAMETER TRANSVERSE (cm) VELOCITIES (cm/sec)  Aorta Proximal 3.5 - 103  Aorta Mid 5.0 5.2 18  Aorta Distal 2.6 2.5 125  Right Common Iliac Artery 2.1 2.1 99  Left Common Iliac Artery 1.6 1.5 248    Previous max aortic diameter:  4.7 x 4.8 Date: 06/24/2014  ADDITIONAL FINDINGS:     IMPRESSION: 1. 5.0 x 5.2 abdominal aortic aneurysm    Compared to the previous exam:  No significant change since prior exams.     Medical Decision Making  The patient is a 81 y.o. male who presents with no significant change in AAA diameter compared to prior AAA Duplex on 12/30/12. His abdominal pain may be from his metastatic colon cancer that has metastasized to his liver. His oncologist estimated he had less than 6 months to live as of April of this year; currently this is about 3 months. He is in Hospice care. I discussed with Dr. Scot Dock pt prognosis of likely living about 3 months according to caregiver's account from pt's oncologist. It is pt's choice as to whether to schedule a follow up. Pt chose to schedule a follow up.  Based on  this patient's exam and diagnostic studies, the patient will follow up  in 6 months  with the following studies: AAA Duplex.  Consideration for repair of AAA would be made when the size is 5.5 cm, growth > 1 cm/yr, and symptomatic status.  I emphasized the importance of maximal medical management including strict control of blood pressure, blood glucose, and lipid levels, antiplatelet agents, obtaining regular exercise, and continued cessation of smoking.   The patient was given information about AAA including signs, symptoms, treatment, and how to minimize the risk of enlargement and rupture of aneurysms.    The patient was advised to call 911 should the patient experience sudden onset abdominal or back pain.   Thank you for allowing [place] to participate in this patient's care.  Clemon Chambers, RN, MSN, FNP-C [facility] [place] Office: [phone]  Clinic Physician: Scot Dock  12/23/2014, 9:20 AM

## 2014-12-24 ENCOUNTER — Ambulatory Visit (INDEPENDENT_AMBULATORY_CARE_PROVIDER_SITE_OTHER): Payer: Medicare Other | Admitting: Internal Medicine

## 2014-12-24 ENCOUNTER — Encounter: Payer: Self-pay | Admitting: Internal Medicine

## 2014-12-24 VITALS — BP 122/78 | HR 97 | Temp 97.7°F | Resp 20 | Ht 72.0 in | Wt 168.4 lb

## 2014-12-24 DIAGNOSIS — I714 Abdominal aortic aneurysm, without rupture, unspecified: Secondary | ICD-10-CM

## 2014-12-24 DIAGNOSIS — E1142 Type 2 diabetes mellitus with diabetic polyneuropathy: Secondary | ICD-10-CM | POA: Diagnosis not present

## 2014-12-24 DIAGNOSIS — C189 Malignant neoplasm of colon, unspecified: Secondary | ICD-10-CM | POA: Diagnosis not present

## 2014-12-24 DIAGNOSIS — R110 Nausea: Secondary | ICD-10-CM

## 2014-12-24 DIAGNOSIS — J449 Chronic obstructive pulmonary disease, unspecified: Secondary | ICD-10-CM | POA: Diagnosis not present

## 2014-12-24 DIAGNOSIS — R918 Other nonspecific abnormal finding of lung field: Secondary | ICD-10-CM

## 2014-12-24 DIAGNOSIS — G629 Polyneuropathy, unspecified: Secondary | ICD-10-CM | POA: Diagnosis not present

## 2014-12-24 DIAGNOSIS — C787 Secondary malignant neoplasm of liver and intrahepatic bile duct: Secondary | ICD-10-CM

## 2014-12-24 MED ORDER — ONDANSETRON HCL 4 MG PO TABS: 4.0000 mg | ORAL_TABLET | Freq: Three times a day (TID) | ORAL | Status: AC | PRN

## 2014-12-24 NOTE — Progress Notes (Signed)
Patient ID: [name], male   DOB: 1927-06-28, 81 y.o.   MRN: [id number]   Location:  [facility]  Code Status: DNR Goals of Care: Advanced Directive information Does patient have an advance directive?: Yes, Type of Advance Directive: [place];Living will;Out of facility DNR (pink MOST or yellow form), Pre-existing out of facility DNR order (yellow form or pink MOST form): Yellow form placed in chart (order not valid for inpatient use), Does patient want to make changes to advanced directive?: No - Patient declined   Chief Complaint  Patient presents with  . Medical Management of Chronic Issues    3 month follow-up    HPI: Patient is a 81 y.o. white male seen in the office today for a med mgt of his chronic illnesses.  He has metastatic colon cancer to his liver.     In April, Dr. Pablo Ledger let him know that his colon cancer had spread to his liver and she recommended a hospice referral.  I placed the referral and signed the admission orders.  Hospice began at the end of april.   Had diarrhea episode last week that kept him from coming from to his last appt.  Today he feels nauseated, but has not vomited.   His appetite is not as good per his caregiver.    The caregiver tells me a story about how pt's sister and [facility]cpoa tried to get him placed in a facility, but he wouldn't go.  They say he was told he was coming her to have a PPD test first, but I so no record of such an appt.    His hospice nurse is coming out weekly, chaplain monthly and social worker 2x per month.     AAA has grown to 5.5cm and "if he is still alive in Jan", they will reassess his AAA.    Has no pain.  Is back to having a flat affect--very hard for me to get him to smile during appt.     Review of Systems:  Review of Systems  Constitutional: Positive for weight loss and malaise/fatigue. Negative for fever and chills.  HENT: Negative for  congestion.   Respiratory: Positive for sputum production and shortness of breath. Negative for cough.   Cardiovascular: Negative for chest pain and leg swelling.  Gastrointestinal: Positive for nausea and diarrhea. Negative for vomiting, constipation, blood in stool and melena.  Genitourinary: Negative for dysuria, urgency and frequency.  Musculoskeletal: Negative for back pain and falls.       Walks with walker way far ahead of him  Neurological: Positive for weakness. Negative for dizziness and loss of consciousness.  Psychiatric/Behavioral: Positive for depression and memory loss.    Past Medical History  Diagnosis Date  . Arthritis   . Depression   . Hypertension   . Hyperlipidemia   . Colon cancer   . AAA (abdominal aortic aneurysm) 2011  . CAD (coronary artery disease) 2003  . Anemia   . Diverticula of colon 2013  . Hypertrophy of prostate without urinary obstruction and other lower urinary tract symptoms (LUTS) 2008  . Hyperpotassemia 2008  . Gout, unspecified 2008  . Impacted cerumen 06/20/2006  . First degree atrioventricular block 2008  . Abnormality of gait 2007  . Other specified cardiac dysrhythmias(427.89) 2005  . Unspecified disorder of kidney and ureter 2005  . Edema 2005  . Malignant neoplasm of colon, unspecified site 2004  . Unspecified hereditary and  idiopathic peripheral neuropathy 2004  . Chest pain, unspecified 2003  . Pain in joint, pelvic region and thigh 2004  . Pain in limb 2003  . Myocardial infarction 1984  . Exertional shortness of breath   . Pneumonia ~ 02/2013    "hospitalized for double pneumonia" (05/15/2013)  . Anxiety   . Chronic kidney disease     "born w/only 1 kidney" (05/15/2013)  . Chronic kidney disease (CKD), stage III (moderate)     [name] 05/14/2013 (05/15/2013)  . Postural dizziness     hospitalized 05/14/2013  . Fall at home Nov. 2015    Past Surgical History  Procedure Laterality Date  . Mediastinotomy    . Right  colectomy  2004  . Inguinal hernia repair Right 1952    In [place]  . Coronary artery bypass graft  1996    Bartle, [place]  . Cardiac catheterization    . Colon surgery    . Lesion removal N/A 08/12/2013    Procedure: EXCISION FACIAL SKIN CANCER X 3    (MINOR PROCEDURE) ;  Surgeon: Rozetta Nunnery, MD;  Location: [facility];  Service: ENT;  Laterality: N/A;  . Cataract extraction  02/04/14    Right eye  . Cataract extraction  02/25/14    Left eye   . Eyelid laceration repair  05/18/14    Left eye   . Eye surgery      Allergies  Allergen Reactions  . Diovan [Valsartan] Other (See Comments)    Unknown    Medications: Patient's Medications  New Prescriptions   ONDANSETRON (ZOFRAN) 4 MG TABLET    Take 1 tablet (4 mg total) by mouth every 8 (eight) hours as needed for nausea or vomiting.  Previous Medications   ALBUTEROL (PROVENTIL HFA;VENTOLIN HFA) 108 (90 BASE) MCG/ACT INHALER    Inhale 2 puffs into the lungs every 6 (six) hours as needed for wheezing or shortness of breath.   ALPRAZOLAM (NIRAVAM) 0.5 MG DISSOLVABLE TABLET    Take 1 tablet (0.5 mg total) by mouth 2 (two) times daily as needed for anxiety.   AMLODIPINE (NORVASC) 5 MG TABLET    Take one tablet by mouth once daily   ATORVASTATIN (LIPITOR) 20 MG TABLET    Take one tablet by mouth once daily for cholesterol   CITALOPRAM (CELEXA) 20 MG TABLET    TAKE 1 TABLET (20 MG TOTAL) BY MOUTH DAILY.   CVS ASPIRIN EC 81 MG EC TABLET    TAKE 1 TABLET EVERY DAY   DIVALPROEX (DEPAKOTE SPRINKLE) 125 MG CAPSULE    TAKE ONE CAPSULE BY MOUTH TWICE DAILY   DONEPEZIL (ARICEPT) 10 MG TABLET    TAKE ONE TABLET BY MOUTH ONCE AT BEDTIME TO PRESERVE MEMORY   FEEDING SUPPLEMENT, ENSURE COMPLETE, (ENSURE COMPLETE) LIQD    Take 237 mLs by mouth 2 (two) times daily between meals.   FLUTICASONE FUROATE-VILANTEROL (BREO ELLIPTA) 100-25 MCG/INH AEPB    Inhale into the lungs once daily to help breathing   HYDRALAZINE (APRESOLINE) 25 MG TABLET     TAKE THREE TABLETS BY MOUTH THREE TIMES DAILY. CVS MUST FILL 90 DAY SUPPLY   MECLIZINE (ANTIVERT) 12.5 MG TABLET    Take 1 tablet (12.5 mg total) by mouth daily as needed for dizziness.   MEMANTINE (NAMENDA) 10 MG TABLET    TAKE ONE TABLET BY MOUTH TWICE DAILY TO HELP PRESERVE MEMORY   MIRTAZAPINE (REMERON) 15 MG TABLET    TAKE 1/2 TABLET BY MOUTH EVERY  NIGHT   SPIRIVA RESPIMAT 2.5 MCG/ACT AERS    daily.   Modified Medications   No medications on file  Discontinued Medications   AMLODIPINE (NORVASC) 5 MG TABLET    TAKE 1 TABLET BY MOUTH EVERY DAY   DIVALPROEX (DEPAKOTE SPRINKLE) 125 MG CAPSULE    Take 1 capsule (125 mg total) by mouth 2 (two) times daily.   DONEPEZIL (ARICEPT) 10 MG TABLET    Take 10 mg by mouth at bedtime.   DONEPEZIL (ARICEPT) 10 MG TABLET    TAKE ONE TABLET BY MOUTH ONCE AT BEDTIME TO PRESERVE MEMORY    Physical Exam: Filed Vitals:   12/24/14 1458  BP: 122/78  Pulse: 97  Temp: 97.7 F (36.5 C)  TempSrc: Oral  Resp: 20  Height: 6' (1.829 m)  Weight: 168 lb 6.4 oz (76.386 kg)  SpO2: 94%   Physical Exam  Constitutional:  Tall cachectic white male  HENT:  Head: Normocephalic and atraumatic.  HOH  Cardiovascular: Normal rate, regular rhythm, normal heart sounds and intact distal pulses.   Pulmonary/Chest: Effort normal and breath sounds normal. He has no wheezes. He has no rales.  Abdominal: Soft. Bowel sounds are normal. He exhibits no distension. There is no tenderness.  Musculoskeletal: Normal range of motion.  Walks with stooped posture with walker way far ahead of him   Neurological: He is alert.  Skin: Skin is warm and dry.  Psychiatric:  Flat affect    Labs reviewed: Basic Metabolic Panel:  Recent Labs  08/28/14 1502  NA 142  K 4.4  CL 106  CO2 21  GLUCOSE 98  BUN 27  CREATININE 2.06*  CALCIUM 9.1   Liver Function Tests:  Recent Labs  08/28/14 1502  AST 20  ALT 9  ALKPHOS 113  BILITOT 0.3  PROT 6.2   No results for input(s):  LIPASE, AMYLASE in the last 8760 hours. No results for input(s): AMMONIA in the last 8760 hours. CBC:  Recent Labs  08/28/14 1502  WBC 6.8  NEUTROABS 4.7  HGB 12.6  HCT 38.6  MCV 89  PLT 196   Lipid Panel:  Recent Labs  12/15/14 0943  CHOL 140  HDL 33*  LDLCALC 85  TRIG 112  CHOLHDL 4.2   Lab Results  Component Value Date   HGBA1C 6.1* 12/15/2014    Procedures since last visit: CT chest w/o contrast 09/28/14:  Interval development of multiple relatively large hypo attenuating lesions in the liver, highly concerning for development of hepatic metastases.  Presumed post radiation change in the left lung encompasses the previously described left lung nodules. There are some new patchy areas of ground-glass attenuation bilaterally which may reflect postinfectious or inflammatory alveolitis. Continued attention on followup imaging is recommended.  Assessment/Plan 1. Nausea without vomiting - likely related to his cancer -will try him on zofran for now, but hospice may have a better idea (?scopolamine) - ondansetron (ZOFRAN) 4 MG tablet; Take 1 tablet (4 mg total) by mouth every 8 (eight) hours as needed for nausea or vomiting.  Dispense: 30 tablet; Refill: 0  2. Metastatic colon cancer to liver -is on hospice care -is losing weight and has more difficulty with periods of nausea and diarrhea -no jaundice or scleral icterus  3. Pulmonary nodules/lesions, multiple -suspected mets also -chronic dyspnea on exertion is no better, is not very active  4. AAA (abdominal aortic aneurysm) without rupture -has grown in size and he still wants it remeasured "if i'm still living in  6 mos"  5. Chronic obstructive pulmonary disease, unspecified COPD, unspecified chronic bronchitis type -he is on breo, spiriva and albuterol at present -no difficulty with wheezing, but continues with chronic productive cough and dyspnea on exertion -? Would benefit from anoro ellipta  6. DM  type 2 with diabetic peripheral neuropathy -diet controlled, hba1c was increased but that is not concerning to me considering his prognosis--I am happy he is eating though he could be eating more and better  Labs/tests ordered:  No orders of the defined types were placed in this encounter.  due to hospice care  Next appt:  3  Months for med mgt  Shandale Malak L. Lailyn Appelbaum, D.O. [facility] [address], [place] [address] Cell Phone (Mon-Fri 8am-5pm):  [phone] On Call:  [phone] & follow prompts after 5pm & weekends Office Phone:  [phone] Office Fax:  [phone]

## 2015-01-11 ENCOUNTER — Ambulatory Visit: Payer: Medicare Other | Admitting: Podiatry

## 2015-01-13 ENCOUNTER — Encounter: Payer: Self-pay | Admitting: Podiatry

## 2015-01-13 ENCOUNTER — Ambulatory Visit (INDEPENDENT_AMBULATORY_CARE_PROVIDER_SITE_OTHER): Payer: Medicare Other | Admitting: Podiatry

## 2015-01-13 DIAGNOSIS — M79673 Pain in unspecified foot: Secondary | ICD-10-CM

## 2015-01-13 DIAGNOSIS — B351 Tinea unguium: Secondary | ICD-10-CM | POA: Diagnosis not present

## 2015-01-13 DIAGNOSIS — E1142 Type 2 diabetes mellitus with diabetic polyneuropathy: Secondary | ICD-10-CM

## 2015-01-13 DIAGNOSIS — G629 Polyneuropathy, unspecified: Secondary | ICD-10-CM

## 2015-01-13 NOTE — Patient Instructions (Signed)
Apply topical antibiotic ointment daily to abrasions from AFOs second right toe, midfoot right, midfoot left and the left great toe   Diabetes and Foot Care Diabetes may cause you to have problems because of poor blood supply (circulation) to your feet and legs. This may cause the skin on your feet to become thinner, break easier, and heal more slowly. Your skin may become dry, and the skin may peel and crack. You may also have nerve damage in your legs and feet causing decreased feeling in them. You may not notice minor injuries to your feet that could lead to infections or more serious problems. Taking care of your feet is one of the most important things you can do for yourself.  HOME CARE INSTRUCTIONS  Wear shoes at all times, even in the house. Do not go barefoot. Bare feet are easily injured.  Check your feet daily for blisters, cuts, and redness. If you cannot see the bottom of your feet, use a mirror or ask someone for help.  Wash your feet with warm water (do not use hot water) and mild soap. Then pat your feet and the areas between your toes until they are completely dry. Do not soak your feet as this can dry your skin.  Apply a moisturizing lotion or petroleum jelly (that does not contain alcohol and is unscented) to the skin on your feet and to dry, brittle toenails. Do not apply lotion between your toes.  Trim your toenails straight across. Do not dig under them or around the cuticle. File the edges of your nails with an emery board or nail file.  Do not cut corns or calluses or try to remove them with medicine.  Wear clean socks or stockings every day. Make sure they are not too tight. Do not wear knee-high stockings since they may decrease blood flow to your legs.  Wear shoes that fit properly and have enough cushioning. To break in new shoes, wear them for just a few hours a day. This prevents you from injuring your feet. Always look in your shoes before you put them on to be sure  there are no objects inside.  Do not cross your legs. This may decrease the blood flow to your feet.  If you find a minor scrape, cut, or break in the skin on your feet, keep it and the skin around it clean and dry. These areas may be cleansed with mild soap and water. Do not cleanse the area with peroxide, alcohol, or iodine.  When you remove an adhesive bandage, be sure not to damage the skin around it.  If you have a wound, look at it several times a day to make sure it is healing.  Do not use heating pads or hot water bottles. They may burn your skin. If you have lost feeling in your feet or legs, you may not know it is happening until it is too late.  Make sure your health care provider performs a complete foot exam at least annually or more often if you have foot problems. Report any cuts, sores, or bruises to your health care provider immediately. SEEK MEDICAL CARE IF:   You have an injury that is not healing.  You have cuts or breaks in the skin.  You have an ingrown nail.  You notice redness on your legs or feet.  You feel burning or tingling in your legs or feet.  You have pain or cramps in your legs and feet.  Your legs or feet are numb.  Your feet always feel cold. SEEK IMMEDIATE MEDICAL CARE IF:   There is increasing redness, swelling, or pain in or around a wound.  There is a red line that goes up your leg.  Pus is coming from a wound.  You develop a fever or as directed by your health care provider.  You notice a bad smell coming from an ulcer or wound. Document Released: 05/26/2000 Document Revised: 01/29/2013 Document Reviewed: 11/05/2012 [facility] Patient Information 2015 [place], [place]. This information is not intended to replace advice given to you by your health care provider. Make sure you discuss any questions you have with your health care provider.

## 2015-01-14 NOTE — Progress Notes (Signed)
Patient ID: [name], male   DOB: [birth year], 81 y.o.   MRN: [id number]  Subjective: This patient presents for a scheduled visit with his caregiver for debridement of mycotic toenails patient's caregiver has been applying topical antibiotic ointments over friction areas associated with bilateral AFOs. Patient has gait disturbance  Objective: Patient unable to respond to direct questioning Dorsal abrasions right foot overlying the dorsal right midfoot in second right toe and dorsal left midfoot and medial left hallux. There is no active drainage or erythema around these areas The toenails are elongated, discolored, incurvated 6-10  Assessment: Diabetic with neuropathy Mycotic toenails 10  multiple abrasions right and left without clinical sign of infection  Plan: Size patient to continue applying topical antibiotic ointments multiple abrasions sites associated with AFOs bilaterally The toenails 10 are debrided mechanically and electrically without any bleeding  Reappoint 3 months

## 2015-01-16 ENCOUNTER — Other Ambulatory Visit
Admission: RE | Admit: 2015-01-16 | Discharge: 2015-01-16 | Disposition: A | Discharge status: Home / Self Care | Source: Ambulatory Visit | Attending: Adult Health | Admitting: Adult Health

## 2015-01-16 DIAGNOSIS — N189 Chronic kidney disease, unspecified: Secondary | ICD-10-CM | POA: Diagnosis not present

## 2015-01-16 LAB — CBC
HCT: 34 % — ABNORMAL LOW (ref 40.0–52.0)
Hemoglobin: 11.3 g/dL — ABNORMAL LOW (ref 13.0–18.0)
MCH: 30.3 pg (ref 26.0–34.0)
MCHC: 33.4 g/dL (ref 32.0–36.0)
MCV: 90.7 fL (ref 80.0–100.0)
Platelets: 209 10*3/uL (ref 150–440)
RBC: 3.75 MIL/uL — ABNORMAL LOW (ref 4.40–5.90)
RDW: 16.7 % — ABNORMAL HIGH (ref 11.5–14.5)
WBC: 9.3 10*3/uL (ref 3.8–10.6)

## 2015-01-16 LAB — BASIC METABOLIC PANEL
Anion gap: 17 — ABNORMAL HIGH (ref 5–15)
BUN: 41 mg/dL — AB (ref 6–20)
CALCIUM: 9.1 mg/dL (ref 8.9–10.3)
CO2: 21 mmol/L — ABNORMAL LOW (ref 22–32)
CREATININE: 3.08 mg/dL — AB (ref 0.61–1.24)
Chloride: 101 mmol/L (ref 101–111)
GFR calc Af Amer: 20 mL/min — ABNORMAL LOW (ref >60)
GFR, EST NON AFRICAN AMERICAN: 17 mL/min — AB (ref >60)
GLUCOSE: 114 mg/dL — AB (ref 65–99)
Potassium: 4.4 mmol/L (ref 3.5–5.1)
SODIUM: 139 mmol/L (ref 135–145)

## 2015-01-27 ENCOUNTER — Telehealth: Payer: Self-pay | Admitting: *Deleted

## 2015-02-11 NOTE — Telephone Encounter (Signed)
Received a call from [facility] and [facility]. She stated that our patient [name] passed away this morning at 6:59 am, information given to [facility] for death certificate and all medical records.

## 2015-02-11 DEATH — deceased

## 2015-02-13 ENCOUNTER — Other Ambulatory Visit: Payer: Self-pay | Admitting: Internal Medicine

## 2015-03-26 ENCOUNTER — Ambulatory Visit: Payer: Medicare Other | Admitting: Internal Medicine

## 2015-04-21 ENCOUNTER — Ambulatory Visit: Payer: Medicare Other | Admitting: Podiatry

## 2015-07-12 ENCOUNTER — Ambulatory Visit: Payer: Medicare Other | Admitting: Family

## 2015-07-12 ENCOUNTER — Other Ambulatory Visit (HOSPITAL_COMMUNITY): Payer: Medicare Other
